# Patient Record
Sex: Female | Born: 1990 | Race: Black or African American | Hispanic: No | Marital: Married | Smoking: Current every day smoker
Health system: Southern US, Community
[De-identification: ages and names within clinical notes are randomized; demographics above are authoritative.]

## PROBLEM LIST (undated history)

## (undated) DIAGNOSIS — I10 Essential (primary) hypertension: Secondary | ICD-10-CM

## (undated) DIAGNOSIS — G8929 Other chronic pain: Secondary | ICD-10-CM

## (undated) DIAGNOSIS — N200 Calculus of kidney: Secondary | ICD-10-CM

## (undated) DIAGNOSIS — J302 Other seasonal allergic rhinitis: Secondary | ICD-10-CM

## (undated) DIAGNOSIS — R109 Unspecified abdominal pain: Secondary | ICD-10-CM

## (undated) DIAGNOSIS — M549 Dorsalgia, unspecified: Secondary | ICD-10-CM

## (undated) DIAGNOSIS — R112 Nausea with vomiting, unspecified: Secondary | ICD-10-CM

## (undated) DIAGNOSIS — E119 Type 2 diabetes mellitus without complications: Secondary | ICD-10-CM

## (undated) DIAGNOSIS — K219 Gastro-esophageal reflux disease without esophagitis: Secondary | ICD-10-CM

## (undated) HISTORY — PX: TONSILLECTOMY AND ADENOIDECTOMY: SHX28

## (undated) HISTORY — PX: TONSILLECTOMY: SUR1361

---
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
## 2009-08-28 ENCOUNTER — Emergency Department (HOSPITAL_COMMUNITY): Admission: EM | Admit: 2009-08-28 | Discharge: 2009-08-29 | Payer: Self-pay | Admitting: Emergency Medicine

## 2009-08-28 ENCOUNTER — Emergency Department (HOSPITAL_COMMUNITY): Admission: EM | Admit: 2009-08-28 | Discharge: 2009-08-28 | Payer: Self-pay | Admitting: Emergency Medicine

## 2009-08-31 ENCOUNTER — Emergency Department (HOSPITAL_COMMUNITY): Admission: EM | Admit: 2009-08-31 | Discharge: 2009-08-31 | Payer: Self-pay | Admitting: Emergency Medicine

## 2009-11-01 ENCOUNTER — Emergency Department (HOSPITAL_COMMUNITY): Admission: EM | Admit: 2009-11-01 | Discharge: 2009-11-01 | Payer: Self-pay | Admitting: Emergency Medicine

## 2009-12-07 ENCOUNTER — Emergency Department (HOSPITAL_COMMUNITY): Admission: EM | Admit: 2009-12-07 | Discharge: 2009-12-07 | Payer: Self-pay | Admitting: Emergency Medicine

## 2010-08-07 ENCOUNTER — Emergency Department (HOSPITAL_COMMUNITY): Admission: EM | Admit: 2010-08-07 | Discharge: 2010-08-07 | Payer: Self-pay | Admitting: Emergency Medicine

## 2011-02-15 LAB — GC/CHLAMYDIA PROBE AMP, GENITAL: Chlamydia, DNA Probe: NEGATIVE

## 2011-02-15 LAB — WET PREP, GENITAL: WBC, Wet Prep HPF POC: NONE SEEN

## 2011-02-15 LAB — POCT URINALYSIS DIPSTICK
Protein, ur: >=300 mg/dL — AB
Specific Gravity, Urine: >=1.03 (ref 1.005–1.030)
Urobilinogen, UA: 1 mg/dL (ref 0.0–1.0)
pH: 6.5 (ref 5.0–8.0)

## 2011-02-15 LAB — POCT PREGNANCY, URINE: Preg Test, Ur: NEGATIVE

## 2011-03-07 LAB — URINALYSIS, ROUTINE W REFLEX MICROSCOPIC
Leukocytes, UA: NEGATIVE
Nitrite: NEGATIVE
Protein, ur: NEGATIVE mg/dL
Urobilinogen, UA: 0.2 mg/dL (ref 0.0–1.0)
pH: 6 (ref 5.0–8.0)

## 2011-03-07 LAB — CBC
HCT: 40.6 % (ref 36.0–46.0)
MCHC: 34.4 g/dL (ref 30.0–36.0)
MCV: 91.3 fL (ref 78.0–100.0)
Platelets: 372 10*3/uL (ref 150–400)

## 2011-03-07 LAB — WET PREP, GENITAL: Yeast Wet Prep HPF POC: NONE SEEN

## 2011-03-07 LAB — BASIC METABOLIC PANEL
BUN: 11 mg/dL (ref 6–23)
CO2: 28 mEq/L (ref 19–32)
Chloride: 103 mEq/L (ref 96–112)
Creatinine, Ser: 1.01 mg/dL (ref 0.4–1.2)
Glucose, Bld: 97 mg/dL (ref 70–99)
Potassium: 3.7 mEq/L (ref 3.5–5.1)

## 2011-03-07 LAB — DIFFERENTIAL
Basophils Relative: 1 % (ref 0–1)
Eosinophils Absolute: 0.3 10*3/uL (ref 0.0–0.7)
Eosinophils Relative: 4 % (ref 0–5)
Neutrophils Relative %: 60 % (ref 43–77)

## 2011-03-07 LAB — PREGNANCY, URINE: Preg Test, Ur: NEGATIVE

## 2011-03-07 LAB — HCG, QUANTITATIVE, PREGNANCY: hCG, Beta Chain, Quant, S: <2 m[IU]/mL (ref <5)

## 2011-03-07 LAB — GC/CHLAMYDIA PROBE AMP, GENITAL: GC Probe Amp, Genital: NEGATIVE

## 2011-03-07 LAB — URINE MICROSCOPIC-ADD ON

## 2011-03-09 LAB — COMPREHENSIVE METABOLIC PANEL
ALT: 21 U/L (ref 0–35)
AST: 25 U/L (ref 0–37)
Albumin: 4.5 g/dL (ref 3.5–5.2)
Alkaline Phosphatase: 66 U/L (ref 39–117)
BUN: 9 mg/dL (ref 6–23)
Chloride: 106 mEq/L (ref 96–112)
Potassium: 3.7 mEq/L (ref 3.5–5.1)
Sodium: 139 mEq/L (ref 135–145)
Total Bilirubin: 0.8 mg/dL (ref 0.3–1.2)
Total Protein: 7.6 g/dL (ref 6.0–8.3)

## 2011-03-09 LAB — URINALYSIS, ROUTINE W REFLEX MICROSCOPIC
Glucose, UA: NEGATIVE mg/dL
Ketones, ur: NEGATIVE mg/dL
Nitrite: NEGATIVE
Protein, ur: NEGATIVE mg/dL
pH: 8 (ref 5.0–8.0)

## 2011-03-09 LAB — CBC
HCT: 38.7 % (ref 36.0–46.0)
Platelets: 319 10*3/uL (ref 150–400)
RDW: 12.7 % (ref 11.5–15.5)
WBC: 5.4 10*3/uL (ref 4.0–10.5)

## 2011-03-09 LAB — WET PREP, GENITAL: Trich, Wet Prep: NONE SEEN

## 2011-03-09 LAB — DIFFERENTIAL
Basophils Absolute: 0.1 10*3/uL (ref 0.0–0.1)
Basophils Relative: 2 % — ABNORMAL HIGH (ref 0–1)
Eosinophils Absolute: 0.4 10*3/uL (ref 0.0–0.7)
Eosinophils Relative: 7 % — ABNORMAL HIGH (ref 0–5)
Monocytes Absolute: 0.4 10*3/uL (ref 0.1–1.0)
Monocytes Relative: 7 % (ref 3–12)

## 2011-03-09 LAB — PREGNANCY, URINE: Preg Test, Ur: NEGATIVE

## 2011-03-09 LAB — GC/CHLAMYDIA PROBE AMP, GENITAL
Chlamydia, DNA Probe: POSITIVE — AB
GC Probe Amp, Genital: NEGATIVE

## 2011-03-09 LAB — RPR: RPR Ser Ql: NONREACTIVE

## 2011-08-16 ENCOUNTER — Emergency Department (HOSPITAL_COMMUNITY)
Admission: EM | Admit: 2011-08-16 | Discharge: 2011-08-16 | Disposition: A | Discharge status: Home / Self Care | Payer: Self-pay | Attending: Emergency Medicine | Admitting: Emergency Medicine

## 2011-08-16 DIAGNOSIS — N390 Urinary tract infection, site not specified: Secondary | ICD-10-CM | POA: Insufficient documentation

## 2011-08-16 DIAGNOSIS — R300 Dysuria: Secondary | ICD-10-CM | POA: Insufficient documentation

## 2011-08-16 DIAGNOSIS — R109 Unspecified abdominal pain: Secondary | ICD-10-CM | POA: Insufficient documentation

## 2011-08-16 LAB — URINE MICROSCOPIC-ADD ON

## 2011-08-16 LAB — URINALYSIS, ROUTINE W REFLEX MICROSCOPIC
Glucose, UA: NEGATIVE mg/dL
Ketones, ur: NEGATIVE mg/dL
Protein, ur: NEGATIVE mg/dL
Urobilinogen, UA: 0.2 mg/dL (ref 0.0–1.0)

## 2011-08-16 LAB — POCT PREGNANCY, URINE: Preg Test, Ur: NEGATIVE

## 2011-09-07 ENCOUNTER — Emergency Department (HOSPITAL_COMMUNITY)
Admission: EM | Admit: 2011-09-07 | Discharge: 2011-09-07 | Disposition: A | Discharge status: Home / Self Care | Payer: Self-pay | Attending: Emergency Medicine | Admitting: Emergency Medicine

## 2011-09-07 ENCOUNTER — Encounter: Payer: Self-pay | Admitting: *Deleted

## 2011-09-07 DIAGNOSIS — R109 Unspecified abdominal pain: Secondary | ICD-10-CM | POA: Insufficient documentation

## 2011-09-07 DIAGNOSIS — R112 Nausea with vomiting, unspecified: Secondary | ICD-10-CM | POA: Insufficient documentation

## 2011-09-07 DIAGNOSIS — F17200 Nicotine dependence, unspecified, uncomplicated: Secondary | ICD-10-CM | POA: Insufficient documentation

## 2011-09-07 DIAGNOSIS — M549 Dorsalgia, unspecified: Secondary | ICD-10-CM | POA: Insufficient documentation

## 2011-09-07 DIAGNOSIS — Z79899 Other long term (current) drug therapy: Secondary | ICD-10-CM | POA: Insufficient documentation

## 2011-09-07 DIAGNOSIS — N12 Tubulo-interstitial nephritis, not specified as acute or chronic: Secondary | ICD-10-CM | POA: Insufficient documentation

## 2011-09-07 DIAGNOSIS — R350 Frequency of micturition: Secondary | ICD-10-CM | POA: Insufficient documentation

## 2011-09-07 DIAGNOSIS — R3915 Urgency of urination: Secondary | ICD-10-CM | POA: Insufficient documentation

## 2011-09-07 LAB — CBC
HCT: 36.8 % (ref 36.0–46.0)
Hemoglobin: 12.7 g/dL (ref 12.0–15.0)
WBC: 7.4 10*3/uL (ref 4.0–10.5)

## 2011-09-07 LAB — URINALYSIS, ROUTINE W REFLEX MICROSCOPIC
Glucose, UA: NEGATIVE mg/dL
Ketones, ur: NEGATIVE mg/dL
Protein, ur: NEGATIVE mg/dL

## 2011-09-07 LAB — DIFFERENTIAL
Lymphocytes Relative: 26 % (ref 12–46)
Lymphs Abs: 1.9 10*3/uL (ref 0.7–4.0)
Monocytes Absolute: 0.6 10*3/uL (ref 0.1–1.0)
Monocytes Relative: 8 % (ref 3–12)
Neutro Abs: 4.5 10*3/uL (ref 1.7–7.7)

## 2011-09-07 LAB — COMPREHENSIVE METABOLIC PANEL
AST: 27 U/L (ref 0–37)
BUN: 13 mg/dL (ref 6–23)
CO2: 25 mEq/L (ref 19–32)
Chloride: 101 mEq/L (ref 96–112)
Creatinine, Ser: 0.66 mg/dL (ref 0.50–1.10)
GFR calc non Af Amer: >90 mL/min (ref >90)
Total Bilirubin: 0.8 mg/dL (ref 0.3–1.2)

## 2011-09-07 LAB — PREGNANCY, URINE: Preg Test, Ur: NEGATIVE

## 2011-09-07 LAB — URINE MICROSCOPIC-ADD ON

## 2011-09-07 MED ORDER — CEPHALEXIN 500 MG PO CAPS: 500.0000 mg | ORAL_CAPSULE | Freq: Four times a day (QID) | ORAL | Status: AC

## 2011-09-07 MED ORDER — ONDANSETRON HCL 4 MG/2ML IJ SOLN
4.0000 mg | Freq: Once | INTRAMUSCULAR | Status: AC
  Administered 2011-09-07: 4 mg via INTRAVENOUS
  Filled 2011-09-07: qty 2

## 2011-09-07 MED ORDER — SODIUM CHLORIDE 0.9 % IV BOLUS (SEPSIS)
1000.0000 mL | Freq: Once | INTRAVENOUS | Status: AC
  Administered 2011-09-07: 1000 mL via INTRAVENOUS

## 2011-09-07 MED ORDER — MORPHINE SULFATE 4 MG/ML IJ SOLN
4.0000 mg | Freq: Once | INTRAMUSCULAR | Status: AC
  Administered 2011-09-07: 4 mg via INTRAVENOUS
  Filled 2011-09-07: qty 1

## 2011-09-07 MED ORDER — HYDROCODONE-ACETAMINOPHEN 5-500 MG PO TABS
1.0000 | ORAL_TABLET | Freq: Four times a day (QID) | ORAL | Status: AC | PRN
Start: 2011-09-07 — End: 2011-09-17

## 2011-09-07 MED ORDER — DEXTROSE 5 % IV SOLN
1.0000 g | Freq: Once | INTRAVENOUS | Status: AC
  Administered 2011-09-07: 1 g via INTRAVENOUS
  Filled 2011-09-07: qty 1

## 2011-09-07 MED ORDER — PROMETHAZINE HCL 25 MG PO TABS: 25.0000 mg | ORAL_TABLET | Freq: Four times a day (QID) | ORAL | Status: DC | PRN

## 2011-09-07 NOTE — ED Provider Notes (Signed)
History     CSN: [id number] Arrival date & time: 09/07/2011  6:51 PM  Chief Complaint  Patient presents with  . Emesis    (Consider location/radiation/quality/duration/timing/severity/associated sxs/prior treatment) HPI Comments: Was seen about 2 weeks ago was diagnosed with urinary tract infection. She did not fill her antibiotics. States she has felt progressively worse during this time and over the past week has had worsening abdominal pain, back pain, nausea, vomiting. She also complained of dizziness the triage which when questioned is described as nausea. She denies dysuria but has frequency and urgency. She denies URI symptoms. She denies vaginal symptoms.  Patient is a 20 y.o. female presenting with vomiting. The history is provided by the patient. No language interpreter was used.  Emesis  This is a new problem. The current episode started more than 1 week ago. The problem occurs 2 to 4 times per day. The problem has been gradually worsening. The emesis has an appearance of stomach contents. There has been no fever. Associated symptoms include abdominal pain. Pertinent negatives include no chills, no cough, no diarrhea, no fever, no headaches and no myalgias.    Past Medical History  Diagnosis Date  . Asthma     Past Surgical History  Procedure Date  . Tonsillectomy     No family history on file.  History  Substance Use Topics  . Smoking status: Current Some Day Smoker    Types: Cigarettes  . Smokeless tobacco: Not on file  . Alcohol Use: No    OB History    Grav Para Term Preterm Abortions TAB SAB Ect Mult Living                  Review of Systems  Constitutional: Negative for fever, chills, activity change and appetite change.  HENT: Negative for congestion, sore throat, rhinorrhea, neck pain and neck stiffness.   Respiratory: Negative for cough and shortness of breath.   Cardiovascular: Negative for chest pain and palpitations.  Gastrointestinal: Positive  for nausea, vomiting and abdominal pain. Negative for diarrhea and constipation.  Genitourinary: Positive for urgency and frequency. Negative for dysuria, flank pain, vaginal bleeding and vaginal discharge.  Musculoskeletal: Positive for back pain. Negative for myalgias.  Neurological: Negative for weakness, light-headedness, numbness and headaches.  All other systems reviewed and are negative.    Allergies  Review of patient's allergies indicates no known allergies.  Home Medications   Current Outpatient Rx  Name Route Sig Dispense Refill  . CEPHALEXIN 500 MG PO CAPS Oral Take 1 capsule (500 mg total) by mouth 4 (four) times daily. 40 capsule 0  . HYDROCODONE-ACETAMINOPHEN 5-500 MG PO TABS Oral Take 1-2 tablets by mouth every 6 (six) hours as needed for pain. 10 tablet 0  . PROMETHAZINE HCL 25 MG PO TABS Oral Take 1 tablet (25 mg total) by mouth every 6 (six) hours as needed for nausea. 30 tablet 0    BP 116/67  Pulse 73  Temp(Src) 98.9 F (37.2 C) (Oral)  Resp 18  Ht 5\' 7"  (1.702 m)  Wt 200 lb (90.719 kg)  BMI 31.32 kg/m2  SpO2 100%  LMP 08/28/2011  Physical Exam  Nursing note and vitals reviewed. Constitutional: She is oriented to person, place, and time. She appears well-developed and well-nourished. No distress.  HENT:  Head: Normocephalic and atraumatic.  Mouth/Throat: Oropharynx is clear and moist. No oropharyngeal exudate.  Eyes: Conjunctivae and EOM are normal. Pupils are equal, round, and reactive to light.  Neck: Normal range  of motion. Neck supple.  Cardiovascular: Normal rate, regular rhythm, normal heart sounds and intact distal pulses.  Exam reveals no gallop and no friction rub.   No murmur heard. Pulmonary/Chest: Effort normal and breath sounds normal. No respiratory distress.  Abdominal: Soft. Bowel sounds are normal. There is tenderness (diffusely). There is no rebound.  Musculoskeletal: Normal range of motion. She exhibits no tenderness.       Lumbar  back: She exhibits tenderness and pain. She exhibits no bony tenderness, no swelling and no deformity.  Neurological: She is alert and oriented to person, place, and time.  Skin: Skin is warm and dry. No rash noted.    ED Course  Procedures (including critical care time)  Labs Reviewed  URINALYSIS, ROUTINE W REFLEX MICROSCOPIC - Abnormal; Notable for the following:    Specific Gravity, Urine >1.030 (*)    Leukocytes, UA SMALL (*)    All other components within normal limits  URINE MICROSCOPIC-ADD ON - Abnormal; Notable for the following:    Squamous Epithelial / LPF MANY (*)    Bacteria, UA MANY (*)    All other components within normal limits  CBC  DIFFERENTIAL  COMPREHENSIVE METABOLIC PANEL  LIPASE, BLOOD  PREGNANCY, URINE  URINE CULTURE   No results found.   1. Pyelonephritis       MDM  The patient's urinalysis is consistent with a urinary tract infection. This in conjunction with back pain and vomiting is consistent with pyelonephritis. The patient was seen 2 weeks ago was diagnosed with urinary tract infection but that not fill her antibiotics. She's been able to tolerate by mouth on numerous department. I feel that she'll be able to tolerate home treatment of pyelonephritis. There is no indication for admission. She's not dehydrated. She has no white count. She received a dose of Rocephin in the emergency department. Her pain was controlled with morphine. She received Zofran. She received a liter of IV fluids. She'll be discharged home with a prescription for Keflex, Vicodin, Phenergan. She is instructed to followup with her primary care physician. She is provided signs and symptoms for which to return the emergency department        Dayton Bailiff, MD 09/07/11 2055

## 2011-09-07 NOTE — Discharge Instructions (Signed)
Pyelonephritis   (Kidney Infection)  In general there are 2 main types of kidney infections:   Infections that come on quickly without any warning (acute pyelonephritis and acute kidney infection).    Infection that persist for long periods of time (chronic pyelonephritis and chronic kidney infection).     CAUSES  Two main causes of kidney infection are:   Bacteria traveling from the bladder to the kidney. This is a problem especially with pregnant women. The urine in the bladder can become filled with bacteria from multiple causes including:    Prostatitis.    Sexual intercourse in females.    Routine bladder infection also called cystitis.    Bacteria traveling from the blood stream to the tissue part of the kidney.   Problems that may increase the risk of getting a kidney infection include:   Diabetes.    Kidney stones or bladder stones.    Cancer.    Catheters placed in the bladder.    Other abnormalities of the kidney or ureter.   SYMPTOMS   Abdominal pain.    Pain in the side or flank area.    Fever.    Chills.    Upset stomach.    Blood in the urine (dark urine).    Frequent urination.    Strong or persistent urge to urinate.    Burning or stinging when urinating.   The last three symptoms may occur first and may represent symptoms of the bladder infection which may occur prior to the kidney infection.  TREATMENT  In general the treatment depends on how severe the infection is.    If it is mild and caught early the doctor may treat you with oral antibiotics and send you home.    If the infection is more severe the bacteria may have gotten into the bloodstream. This will require intravenous antibiotics and a hospital stay. Symptoms may include:    High fever.    Severe flank pain.    Shaking chills.    Even after a hospital stay the doctor may require you to be on oral antibiotics for a period of time.    Other treatments may be required depending upon the cause of the infection.    HOME CARE INSTRUCTIONS   Take all of the antibiotics given to you by the doctor    Set up appointment to have the urine checked to make sure the infection is gone    Drink plenty of fluids    The doctor may also supply medicines for the bladder if you have urgency and frequency of urination.   SEEK IMMEDIATE MEDICAL CARE IF:   You develop a very high fever over 103 F (39.4 C).    You are unable to take the antibiotics or fluids.    If you develop shaking chills.    You experience extreme weakness or fainting.    There is no improvement after 2 days of treatment.   Document Released: 11/19/2005 Document Re-Released: 02/13/2010  [employer] Patient Information 2011 [employer].

## 2011-09-07 NOTE — ED Notes (Signed)
Pt c/o abdominal pain, back pain, nausea and vomiting x 1 week. Pt denies urinary symptoms. Pt also c/o dizziness off and on x 4 days.

## 2011-09-09 LAB — URINE CULTURE: Colony Count: >=100000

## 2011-09-17 LAB — URINE CULTURE

## 2011-09-17 LAB — URINE MICROSCOPIC-ADD ON

## 2011-09-17 LAB — URINALYSIS, ROUTINE W REFLEX MICROSCOPIC
Bilirubin Urine: NEGATIVE
Glucose, UA: NEGATIVE
Hgb urine dipstick: NEGATIVE
Ketones, ur: NEGATIVE
Protein, ur: NEGATIVE

## 2011-09-17 LAB — POCT PREGNANCY, URINE
Operator id: 277751
Preg Test, Ur: NEGATIVE

## 2011-11-21 ENCOUNTER — Emergency Department (HOSPITAL_COMMUNITY)
Admission: EM | Admit: 2011-11-21 | Discharge: 2011-11-21 | Disposition: A | Discharge status: Home / Self Care | Payer: Self-pay | Attending: Emergency Medicine | Admitting: Emergency Medicine

## 2011-11-21 ENCOUNTER — Encounter (HOSPITAL_COMMUNITY): Payer: Self-pay | Admitting: *Deleted

## 2011-11-21 DIAGNOSIS — Z3201 Encounter for pregnancy test, result positive: Secondary | ICD-10-CM | POA: Insufficient documentation

## 2011-11-21 DIAGNOSIS — L03119 Cellulitis of unspecified part of limb: Secondary | ICD-10-CM

## 2011-11-21 DIAGNOSIS — L02419 Cutaneous abscess of limb, unspecified: Secondary | ICD-10-CM | POA: Insufficient documentation

## 2011-11-21 DIAGNOSIS — F17200 Nicotine dependence, unspecified, uncomplicated: Secondary | ICD-10-CM | POA: Insufficient documentation

## 2011-11-21 LAB — URINALYSIS, ROUTINE W REFLEX MICROSCOPIC
Glucose, UA: NEGATIVE mg/dL
Hgb urine dipstick: NEGATIVE
Leukocytes, UA: NEGATIVE
Specific Gravity, Urine: 1.015 (ref 1.005–1.030)

## 2011-11-21 LAB — PREGNANCY, URINE: Preg Test, Ur: POSITIVE

## 2011-11-21 NOTE — ED Notes (Signed)
Pt states LMP was October and she may be pregnant. Pt also states that since getting tattoo, she has had boils come up on her leg. NAD

## 2011-11-21 NOTE — ED Provider Notes (Addendum)
History     CSN: [id number] Arrival date & time: 11/21/2011 11:24 AM   First MD Initiated Contact with Patient 11/21/11 1127      Chief Complaint  Patient presents with  . Possible Pregnancy    (Consider location/radiation/quality/duration/timing/severity/associated sxs/prior treatment) HPI 20 y/o with history of asthma presents for lesions on R calf and concern of no menstrual period since October.  1st lesion developed two weeks ago.  Started as a "hole" on anterior shin that drained bloody purulent fluid for the first day or two and has not since.  Surrounding erythema soon developed that eventually to >4in diameter.  This is now resolving but still present.  Two days ago a second lesion developed lower down on anterior calf with similar but smaller surround erythema.  Drained some bloody fluid yesterday.  Patient had a tattoo done on arm 1 week prior to first lesion.  Also has a lot of spiders and some mice in her home.    In addition, she has not had a menstrual period since about October 20th.  She usually has menstrual periods monthly, and her last one was normal.      Past Medical History  Diagnosis Date  . Asthma     Past Surgical History  Procedure Date  . Tonsillectomy     No family history on file.  History  Substance Use Topics  . Smoking status: Current Some Day Smoker    Types: Cigarettes  . Smokeless tobacco: Not on file  . Alcohol Use: No    OB History    Grav Para Term Preterm Abortions TAB SAB Ect Mult Living                  Review of Systems  Allergies  Review of patient's allergies indicates no known allergies.  Home Medications   Current Outpatient Rx  Name Route Sig Dispense Refill  . ACETAMINOPHEN 500 MG PO TABS Oral Take 500 mg by mouth as needed. For pain     . BISMUTH SUBSALICYLATE 262 MG/15ML PO SUSP Oral Take 15 mLs by mouth as needed. For upset stomach       BP 109/60  Pulse 78  Temp(Src) 98.7 F (37.1 C) (Oral)  Resp  16  Ht 5\' 7"  (1.702 m)  Wt 200 lb (90.719 kg)  BMI 31.32 kg/m2  SpO2 100%  LMP 09/21/2011  Physical Exam  General: alert, well-developed, and cooperative to examination.  Head: normocephalic and atraumatic.  Eyes: vision grossly intact, pupils equal, pupils round, pupils reactive to light, no injection and anicteric.  Mouth: pharynx pink and moist, no erythema, and no exudates.  Lungs: normal respiratory effort, no accessory muscle use, normal breath sounds, no crackles, and no wheezes. Heart: normal rate, regular rhythm, no murmur, no gallop, and no rub.  Abdomen: soft, non-tender, normal bowel sounds, no distention, no guarding, no rebound tenderness Pulses: 2+ DP/PT pulses bilaterally  Extremities: Right lower extremity: Two excoriated spots on R calf.  Superior spot has 0.75cm diameter central excoriation and surrounding 8cm diameter resolving erythema.  Inferior spot is small spot of excoriation with surround redder erythema.  No drainage can be expressed, no fluid pocket can be palpated.  Neurologic: alert & oriented X3, cranial nerves II-XII intact, strength normal in all extremities   ED Course  Procedures (including critical care time)  Labs Reviewed  URINALYSIS, ROUTINE W REFLEX MICROSCOPIC - Abnormal; Notable for the following:    APPearance HAZY (*)    All  other components within normal limits  POCT PREGNANCY, URINE  PREGNANCY, URINE  POCT PREGNANCY, URINE   No results found.   1. Positive pregnancy test   2. Cellulitis, leg        MDM  I discussed this case with my attending Dr. Adriana Simas who also saw this patient.  Cellulitis seems to be improving.  In setting of pregnancy do not wish to start abx at this time.   Pregnancy test positive.  Patient should follow-up with OBGyn on call Dr. Emelda Fear.  His office number given to patient.          Blanca Friend, MD 11/21/11 1306  Blanca Friend, MD 11/21/11 1306

## 2011-11-21 NOTE — ED Notes (Addendum)
Pt presents with ?"boils" to rt lower leg .  Present for 3 weeks,but had a way to the hospital today.  Also thinks she may be pregnant.

## 2011-11-21 NOTE — ED Provider Notes (Signed)
  I performed a history and physical examination of Danielle Zimmerman and discussed her management with Dr. Yaakov Guthrie.  I agree with the history, physical, assessment, and plan of care, with the following exceptions: None  I was present for the following procedures: None Time Spent in Critical Care of the patient: None Time spent in discussions with the patient and family:   Vernona Rieger, MD 11/21/11 1353

## 2011-12-11 ENCOUNTER — Emergency Department (HOSPITAL_COMMUNITY)
Admission: EM | Admit: 2011-12-11 | Discharge: 2011-12-11 | Discharge status: Against Medical Advice | Payer: Self-pay | Attending: Emergency Medicine | Admitting: Emergency Medicine

## 2011-12-11 ENCOUNTER — Encounter (HOSPITAL_COMMUNITY): Payer: Self-pay | Admitting: *Deleted

## 2011-12-11 DIAGNOSIS — R109 Unspecified abdominal pain: Secondary | ICD-10-CM | POA: Insufficient documentation

## 2011-12-11 NOTE — ED Notes (Signed)
Pregnant, abd cramping for 4-5 days. No bleeding.  Nausea, vomiting, no diarrhea/

## 2012-01-11 ENCOUNTER — Emergency Department (HOSPITAL_COMMUNITY)
Admission: EM | Admit: 2012-01-11 | Discharge: 2012-01-11 | Disposition: A | Discharge status: Home / Self Care | Payer: Medicaid Other | Attending: Emergency Medicine | Admitting: Emergency Medicine

## 2012-01-11 ENCOUNTER — Encounter (HOSPITAL_COMMUNITY): Payer: Self-pay

## 2012-01-11 ENCOUNTER — Emergency Department (HOSPITAL_COMMUNITY): Payer: Medicaid Other

## 2012-01-11 DIAGNOSIS — O0289 Other abnormal products of conception: Secondary | ICD-10-CM | POA: Insufficient documentation

## 2012-01-11 DIAGNOSIS — Z87891 Personal history of nicotine dependence: Secondary | ICD-10-CM | POA: Insufficient documentation

## 2012-01-11 DIAGNOSIS — A499 Bacterial infection, unspecified: Secondary | ICD-10-CM | POA: Insufficient documentation

## 2012-01-11 DIAGNOSIS — N760 Acute vaginitis: Secondary | ICD-10-CM | POA: Insufficient documentation

## 2012-01-11 DIAGNOSIS — N949 Unspecified condition associated with female genital organs and menstrual cycle: Secondary | ICD-10-CM | POA: Insufficient documentation

## 2012-01-11 DIAGNOSIS — J45909 Unspecified asthma, uncomplicated: Secondary | ICD-10-CM | POA: Insufficient documentation

## 2012-01-11 DIAGNOSIS — O020 Blighted ovum and nonhydatidiform mole: Secondary | ICD-10-CM

## 2012-01-11 DIAGNOSIS — B9689 Other specified bacterial agents as the cause of diseases classified elsewhere: Secondary | ICD-10-CM | POA: Insufficient documentation

## 2012-01-11 LAB — URINALYSIS, ROUTINE W REFLEX MICROSCOPIC
Ketones, ur: NEGATIVE mg/dL
Leukocytes, UA: NEGATIVE
Nitrite: NEGATIVE
Specific Gravity, Urine: 1.02 (ref 1.005–1.030)
Urobilinogen, UA: 0.2 mg/dL (ref 0.0–1.0)
pH: 6 (ref 5.0–8.0)

## 2012-01-11 LAB — DIFFERENTIAL
Basophils Absolute: 0.1 10*3/uL (ref 0.0–0.1)
Basophils Relative: 1 % (ref 0–1)
Eosinophils Absolute: 0.3 10*3/uL (ref 0.0–0.7)
Monocytes Relative: 6 % (ref 3–12)
Neutrophils Relative %: 48 % (ref 43–77)

## 2012-01-11 LAB — CBC
Hemoglobin: 11.9 g/dL — ABNORMAL LOW (ref 12.0–15.0)
MCH: 31 pg (ref 26.0–34.0)
MCHC: 34.9 g/dL (ref 30.0–36.0)
Platelets: 301 10*3/uL (ref 150–400)
RDW: 13 % (ref 11.5–15.5)

## 2012-01-11 LAB — TYPE AND SCREEN: Antibody Screen: NEGATIVE

## 2012-01-11 LAB — RPR: RPR Ser Ql: NONREACTIVE

## 2012-01-11 LAB — WET PREP, GENITAL: Trich, Wet Prep: NONE SEEN

## 2012-01-11 MED ORDER — METRONIDAZOLE 500 MG PO TABS: 500.0000 mg | ORAL_TABLET | Freq: Two times a day (BID) | ORAL | Status: AC

## 2012-01-11 MED ORDER — HYDROCODONE-ACETAMINOPHEN 5-325 MG PO TABS: 1.0000 | ORAL_TABLET | ORAL | Status: AC | PRN

## 2012-01-11 MED ORDER — OXYCODONE-ACETAMINOPHEN 5-325 MG PO TABS
1.0000 | ORAL_TABLET | Freq: Once | ORAL | Status: AC
  Administered 2012-01-11: 1 via ORAL
  Filled 2012-01-11: qty 1

## 2012-01-11 NOTE — ED Notes (Signed)
rn and edpa attempted to assess fht at bedside. edp to see pt.

## 2012-01-11 NOTE — ED Provider Notes (Signed)
Pt seen with PA Pregnant, abd cramping but denies vag bleeding Will need [place] to confirm IUP BP 131/64  Pulse 78  Temp(Src) 98.5 F (36.9 C) (Oral)  Resp 20  Ht 5\' 7"  (1.702 m)  Wt 224 lb (101.606 kg)  BMI 35.08 kg/m2  SpO2 99%  LMP 09/21/2011   Joya Gaskins, MD 01/11/12 1236
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
## 2012-01-12 LAB — GC/CHLAMYDIA PROBE AMP, GENITAL: Chlamydia, DNA Probe: NEGATIVE

## 2012-01-16 ENCOUNTER — Emergency Department (HOSPITAL_COMMUNITY)
Admission: EM | Admit: 2012-01-16 | Discharge: 2012-01-16 | Disposition: A | Discharge status: Home / Self Care | Payer: Medicaid Other | Attending: Emergency Medicine | Admitting: Emergency Medicine

## 2012-01-16 ENCOUNTER — Encounter (HOSPITAL_COMMUNITY): Payer: Self-pay

## 2012-01-16 DIAGNOSIS — R1909 Other intra-abdominal and pelvic swelling, mass and lump: Secondary | ICD-10-CM | POA: Insufficient documentation

## 2012-01-16 DIAGNOSIS — O034 Incomplete spontaneous abortion without complication: Secondary | ICD-10-CM | POA: Insufficient documentation

## 2012-01-16 DIAGNOSIS — R0989 Other specified symptoms and signs involving the circulatory and respiratory systems: Secondary | ICD-10-CM | POA: Insufficient documentation

## 2012-01-16 DIAGNOSIS — R109 Unspecified abdominal pain: Secondary | ICD-10-CM | POA: Insufficient documentation

## 2012-01-16 DIAGNOSIS — R110 Nausea: Secondary | ICD-10-CM | POA: Insufficient documentation

## 2012-01-16 DIAGNOSIS — J45909 Unspecified asthma, uncomplicated: Secondary | ICD-10-CM | POA: Insufficient documentation

## 2012-01-16 MED ORDER — HYDROMORPHONE HCL PF 1 MG/ML IJ SOLN
1.0000 mg | Freq: Once | INTRAMUSCULAR | Status: AC
  Administered 2012-01-16: 1 mg via INTRAMUSCULAR
  Filled 2012-01-16: qty 1

## 2012-01-16 MED ORDER — ONDANSETRON 8 MG PO TBDP: 8.0000 mg | ORAL_TABLET | Freq: Three times a day (TID) | ORAL | Status: AC | PRN

## 2012-01-16 MED ORDER — ONDANSETRON 4 MG PO TBDP
4.0000 mg | ORAL_TABLET | Freq: Once | ORAL | Status: AC
  Administered 2012-01-16: 4 mg via ORAL
  Filled 2012-01-16: qty 1

## 2012-01-16 MED ORDER — OXYCODONE-ACETAMINOPHEN 5-325 MG PO TABS: 1.0000 | ORAL_TABLET | Freq: Four times a day (QID) | ORAL | Status: AC | PRN

## 2012-01-16 NOTE — ED Notes (Signed)
Patient states she does not need anything at this time. 

## 2012-01-16 NOTE — Discharge Instructions (Signed)
Incomplete Miscarriage Miscarriages in pregnancy are common. A miscarriage is a pregnancy that has ended before the twentieth week. You have had an incomplete miscarriage. Partial parts of the fetus or placenta (afterbirth) remain behind. Sometimes further treatment is needed. The most common reason for further treatment is continued bleeding (hemorrhage). Tissue left behind may also become infected. Treatment usually is curettage. Curettage for an incomplete abortion is a procedure in which the remaining products of pregnancy are removed. This can be done by a simple sucking procedure (suction curettage). It can also be done by a simple scraping (curettage) of the inside of the uterus (womb). This may be done in the hospital or in the caregiver's office. This is only done when your caregiver knows the pregnancy has ended. This is determined by physical examination and a negative pregnancy test. It may also include an ultrasound to confirm a dead fetus. The ultrasound may also prove that products of the pregnancy remain in the uterus. If your cervix remains dilated and you are still passing clots and tissue, your caregiver may wish to watch you for a little while. Your caregiver may want to see if you are going to finish passing all of the remaining parts of the pregnancy. If the bleeding continues, they may proceed with curettage. WHY DO I FEEL THIS WAY Miscarriages can be a very emotional time for prospective mothers. This is not you or your partner's fault. The miscarriage did not occur because of a lack in you or your partner. Nearly all miscarriages occur because the pregnancy has started off wrongly. At least half of miscarried pregnancies have a chromosomal abnormality (almost always not inherited). Others may have developmental problems with the fetus or placentas. Problems may not show up even when the products miscarried are studied under the microscope. You can usually begin trying for another  pregnancy as soon as your caregiver says it's okay. HOME CARE INSTRUCTIONS   Your caregiver may order bed rest (this means only getting up to use the bathroom). Your caregiver may allow you to continue light activity. If curettage was not done at this time, but you require further treatment.   Keep track of the number of pads you use each day. Keep track of how saturated (soaked) they are. Record this information.   Do not use tampons. Do not douche or have sexual intercourse until approved by your caregiver.   It is very important to keep all follow-up appointments for re-evaluation and continuing management.   Women who have an Rh negative blood type (ie, A, B, AB, or O negative) need to receive a drug called Rh(D) immune globulin. This medicine helps protect future fetuses against problems that can occur if an Rh negative mother is carrying a baby who is Rh positive.  SEEK IMMEDIATE MEDICAL CARE IF:   You experience severe cramps in your stomach, back, or abdomen.   You run an unexplained temperature (record these).   You pass large clots or tissue (save any tissue for your caregiver to inspect).   Your bleeding increases or you become light-headed, weak, or have fainting episodes.  MAKE SURE YOU:   Understand these instructions.   Will watch your condition.   Will get help right away if you are not doing well or get worse.  Document Released: 11/19/2005 Document Revised: 08/01/2011 Document Reviewed: 07/09/2008 Exit[employer] Patient Information 2012 [employer]. 

## 2012-01-16 NOTE — ED Provider Notes (Signed)
History     CSN: [id number]  Arrival date & time 01/16/12  1129   First MD Initiated Contact with Patient 01/16/12 1303      Chief Complaint  Patient presents with  . Miscarriage    (Consider location/radiation/quality/duration/timing/severity/associated sxs/prior treatment) The history is provided by the patient.   patient states she took a pill to help with a miscarriage. She states her gynecologist Dr. Despina Hidden gave her some medication to help with that. She states she is given on Monday, but didn't take until 11 PM last night since January. She states she's had abdominal pain cramping and heavy bleeding. She thinks she is passed some products already. She states she was [redacted] weeks pregnant. She states it was not an ectopic pregnancy. She states to [facility] that she was given has not helped the pain. No fevers. No dysuria. No lightheadedness or dizziness.  Past Medical History  Diagnosis Date  . Asthma   . Pregnant state, incidental     Past Surgical History  Procedure Date  . Tonsillectomy     No family history on file.  History  Substance Use Topics  . Smoking status: Former Smoker    Types: Cigarettes  . Smokeless tobacco: Not on file  . Alcohol Use: No    OB History    Grav Para Term Preterm Abortions TAB SAB Ect Mult Living   1               Review of Systems  Constitutional: Negative for activity change and appetite change.  HENT: Negative for neck stiffness.   Eyes: Negative for pain.  Respiratory: Negative for chest tightness and shortness of breath.   Cardiovascular: Negative for chest pain and leg swelling.  Gastrointestinal: Positive for nausea (patient states she feels nauseous with the pain medication) and abdominal pain. Negative for vomiting and diarrhea.  Genitourinary: Positive for vaginal bleeding. Negative for flank pain and vaginal discharge.  Musculoskeletal: Negative for back pain.  Skin: Negative for rash.  Neurological: Negative for weakness,  numbness and headaches.  Psychiatric/Behavioral: Negative for behavioral problems.    Allergies  Review of patient's allergies indicates no known allergies.  Home Medications   Current Outpatient Rx  Name Route Sig Dispense Refill  . HYDROCODONE-ACETAMINOPHEN 5-325 MG PO TABS Oral Take 1 tablet by mouth every 4 (four) hours as needed for pain. 20 tablet 0  . METRONIDAZOLE 500 MG PO TABS Oral Take 1 tablet (500 mg total) by mouth 2 (two) times daily. 14 tablet 0  . ONDANSETRON 8 MG PO TBDP Oral Take 1 tablet (8 mg total) by mouth every 8 (eight) hours as needed for nausea. 20 tablet 0  . OXYCODONE-ACETAMINOPHEN 5-325 MG PO TABS Oral Take 1-2 tablets by mouth every 6 (six) hours as needed for pain. 20 tablet 0    BP 130/86  Pulse 71  Temp(Src) 99.1 F (37.3 C) (Oral)  Resp 18  Ht 5\' 7"  (1.702 m)  Wt 225 lb (102.059 kg)  BMI 35.24 kg/m2  SpO2 99%  LMP 01/15/2012  Breastfeeding? Unknown  Physical Exam  Nursing note and vitals reviewed. Constitutional: She is oriented to person, place, and time. She appears well-developed and well-nourished.  HENT:  Head: Normocephalic and atraumatic.  Eyes: EOM are normal. Pupils are equal, round, and reactive to light.  Neck: Normal range of motion. Neck supple.  Cardiovascular: Normal rate, regular rhythm and normal heart sounds.   No murmur heard. Pulmonary/Chest: Effort normal. No respiratory distress. She has  no wheezes. She has rales.  Abdominal: Soft. Bowel sounds are normal. She exhibits mass. She exhibits no distension. There is no tenderness. There is no rebound and no guarding.       Mild suprapubic mass.  Musculoskeletal: Normal range of motion.  Neurological: She is alert and oriented to person, place, and time. No cranial nerve deficit.  Skin: Skin is warm and dry.  Psychiatric: She has a normal mood and affect. Her speech is normal.   pelvic exam showed some likely products of conception passing through the os. There was   not  severe active bleeding. No pertinent drainage. ED Course  Procedures (including critical care time)  Labs Reviewed - No data to display No results found.   1. Incomplete miscarriage       MDM  Patient appears to be miscarrying. She is given some medicine by her OB/GYN to help with that. She states cramping and pain are more than she expected. sHe states the bleeding has improved. Likely products are passing on pelvic exam. She's given increased pain medicines and nausea medicine. She'll followup with Dr. Despina Hidden as needed        Juliet Rude. Rubin Payor, MD 01/16/12 1340

## 2012-01-16 NOTE — ED Notes (Signed)
Pt reports was here a few days ago and was told was having a miscarriage.  Says was given the medicine to help the miscarriage Monday but pt didn't get to take the medication until 10pm last night.  Says has had severe abd cramping and heavy bleeding.  Pt says thinks has already passed POC.
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
## 2012-01-24 NOTE — ED Provider Notes (Signed)
Medical screening examination/treatment/procedure(s) were conducted as a shared visit with non-physician practitioner(s) and myself.  I personally evaluated the patient during the encounter   Joya Gaskins, MD 01/24/12 [phone]

## 2012-03-31 ENCOUNTER — Encounter (HOSPITAL_COMMUNITY): Payer: Self-pay | Admitting: *Deleted

## 2012-03-31 ENCOUNTER — Emergency Department (HOSPITAL_COMMUNITY)
Admission: EM | Admit: 2012-03-31 | Discharge: 2012-03-31 | Disposition: A | Discharge status: Home / Self Care | Payer: Medicaid Other | Attending: Emergency Medicine | Admitting: Emergency Medicine

## 2012-03-31 DIAGNOSIS — J45909 Unspecified asthma, uncomplicated: Secondary | ICD-10-CM | POA: Insufficient documentation

## 2012-03-31 DIAGNOSIS — Z87891 Personal history of nicotine dependence: Secondary | ICD-10-CM | POA: Insufficient documentation

## 2012-03-31 DIAGNOSIS — R109 Unspecified abdominal pain: Secondary | ICD-10-CM | POA: Insufficient documentation

## 2012-03-31 LAB — URINALYSIS, ROUTINE W REFLEX MICROSCOPIC
Bilirubin Urine: NEGATIVE
Protein, ur: NEGATIVE mg/dL
Urobilinogen, UA: 0.2 mg/dL (ref 0.0–1.0)

## 2012-03-31 LAB — URINE MICROSCOPIC-ADD ON

## 2012-03-31 MED ORDER — ACETAMINOPHEN 325 MG PO TABS
650.0000 mg | ORAL_TABLET | Freq: Once | ORAL | Status: AC
  Administered 2012-03-31: 650 mg via ORAL
  Filled 2012-03-31: qty 2

## 2012-03-31 MED ORDER — HYDROCODONE-ACETAMINOPHEN 5-325 MG PO TABS
1.0000 | ORAL_TABLET | Freq: Once | ORAL | Status: AC
  Administered 2012-03-31: 1 via ORAL
  Filled 2012-03-31: qty 1

## 2012-03-31 MED ORDER — HYDROCODONE-ACETAMINOPHEN 5-325 MG PO TABS: 1.0000 | ORAL_TABLET | ORAL | Status: AC | PRN

## 2012-03-31 NOTE — ED Notes (Signed)
Pt presents with lower abdominal pain x 3 days. States "just ended period, but period was lighter and mostly spotting and continues to spot". Pt denies emesis and diarrhea. Admits to nausea and constipation. Pt reports having a spontaneous abortion in february.  Dr Emelda Fear has followed pt during miscarriage.

## 2012-03-31 NOTE — Discharge Instructions (Signed)
Your urine was clear with no infection. Pregnancy was negative. Use the pain medicine as needed. Followup with Dr. Emelda Fear if you continue to have pain.

## 2012-03-31 NOTE — ED Provider Notes (Signed)
History  This chart was scribed for [employer]. Colon Branch, MD by Toya Smothers. The patient was seen in room APA10/APA10. Patient's care was started at 1641.  CSN: [id number]  Arrival date & time 03/31/12  1641   First MD Initiated Contact with Patient 03/31/12 1718      Chief Complaint  Patient presents with  . Abdominal Pain    The history is provided by the patient. No language interpreter was used.    Danielle Zimmerman is a 21 y.o. female with a h/o asthma who presents to the Emergency Department complaining of gradual onset, gradually worsening, constant, severe lower abdominal pain that radiates to the suprapubic region onset yesterday described as sharp and stabbing with no associated symptoms. She denies any modifying factors. She has not taken any medications to improve symptoms. Pt reports her LNMP ended yesterday and that it is the first period that she has had since her miscarriage in February 2013. She was [redacted] weeks along when she miscarried and states that it was her first pregnancy. She states that she originally attributed the pain to being on her menstrual cycle but became concerned when it continued into today. She reports having a prior episode of similar pain one week before being told she was pregnant. She states that the pain she is experiencing now is more severe than the pain she experienced while pregnant. She denies vaginal discharge, vaginal bleeding, chest pain, HA and dysuria. Pt is a former smoker but denies alcohol use.  Pt states that she was seeing Dr. Marylouise Stacks but has not follow up since miscarriage.   Past Medical History  Diagnosis Date  . Asthma     Past Surgical History  Procedure Date  . Tonsillectomy     History reviewed. No pertinent family history.  History  Substance Use Topics  . Smoking status: Former Smoker    Types: Cigarettes  . Smokeless tobacco: Not on file  . Alcohol Use: No    OB History    Grav Para Term Preterm Abortions TAB SAB Ect  Mult Living   1 0   1           Review of Systems A complete 10 system review of systems was obtained and all systems are negative except as noted in the HPI and PMH.   Allergies  Review of patient's allergies indicates no known allergies.  Home Medications  No current outpatient prescriptions on file.  Triage Vitals: BP 124/80  Pulse 62  Temp(Src) 98.3 F (36.8 C) (Oral)  Resp 20  Ht 5\' 7"  (1.702 m)  Wt 210 lb (95.255 kg)  BMI 32.89 kg/m2  SpO2 100%  LMP 03/25/2012  Breastfeeding? No  Physical Exam  Nursing note and vitals reviewed. Constitutional: She is oriented to person, place, and time. She appears well-developed and well-nourished.  HENT:  Head: Normocephalic and atraumatic.  Eyes: Conjunctivae and EOM are normal.  Neck: Normal range of motion. Neck supple.  Cardiovascular: Normal rate, regular rhythm, normal heart sounds and intact distal pulses.  Exam reveals no gallop and no friction rub.   No murmur heard. Pulmonary/Chest: Effort normal and breath sounds normal. No respiratory distress. She has no wheezes. She has no rales.  Abdominal: Soft. Bowel sounds are normal. There is tenderness. There is no rebound and no guarding.       Mild RLQ tenderness and mild suprapubic discomfort to palpation   Musculoskeletal: Normal range of motion. She exhibits no edema.  Neurological: She is  alert and oriented to person, place, and time. No cranial nerve deficit.  Skin: Skin is warm and dry.  Psychiatric: Her behavior is normal.       Flat affect    ED Course  Procedures (including critical care time)  DIAGNOSTIC STUDIES: Oxygen Saturation is 100% on room air,normal by my interpretation.    COORDINATION OF CARE: 5:35PM-Discussed treatment plan which includes urinalysis and pain medications with pt and pt agreed. 7:57PM-Discussed negative pregnancy test with pt and pt acknowledged negative test. Advised antinflammatory for pain control. Advised patient to return if  she develops fever, emesis, or severe abdominal pain. Pt is comfortable being discharged home.  Results for orders placed during the hospital encounter of 03/31/12  URINALYSIS, ROUTINE W REFLEX MICROSCOPIC      Component Value Range   Color, Urine YELLOW  YELLOW    APPearance CLEAR  CLEAR    Specific Gravity, Urine 1.025  1.005 - 1.030    pH 6.0  5.0 - 8.0    Glucose, UA NEGATIVE  NEGATIVE (mg/dL)   Hgb urine dipstick TRACE (*) NEGATIVE    Bilirubin Urine NEGATIVE  NEGATIVE    Ketones, ur NEGATIVE  NEGATIVE (mg/dL)   Protein, ur NEGATIVE  NEGATIVE (mg/dL)   Urobilinogen, UA 0.2  0.0 - 1.0 (mg/dL)   Nitrite NEGATIVE  NEGATIVE    Leukocytes, UA NEGATIVE  NEGATIVE   PREGNANCY, URINE      Component Value Range   Preg Test, Ur NEGATIVE  NEGATIVE   URINE MICROSCOPIC-ADD ON      Component Value Range   Squamous Epithelial / LPF RARE  RARE    WBC, UA 0-2  <3 (WBC/hpf)   RBC / HPF 0-2  <3 (RBC/hpf)   Bacteria, UA RARE  RARE    No results found.   MDM  Patient who presents with lower abdominal pain this present since yesterday. Urine is negative for infection, urine pregnancy is.Patient was given Tylenol and hydrocodone with relief. Pt feels improved after observation and/or treatment in ED.Pt stable in ED with no significant deterioration in condition.The patient appears reasonably screened and/or stabilized for discharge and I doubt any other medical condition or other [facility] requiring further screening, evaluation, or treatment in the ED at this time prior to discharge.  I personally performed the services described in this documentation, which was scribed in my presence. The recorded information has been reviewed and considered.   MDM Reviewed: nursing note and vitals Interpretation: labs        Nicoletta Dress. Colon Branch, MD 04/02/12 1357

## 2012-03-31 NOTE — ED Notes (Signed)
Low abd pain "like my ovaries hurt"

## 2012-05-13 ENCOUNTER — Encounter (HOSPITAL_COMMUNITY): Payer: Self-pay | Admitting: *Deleted

## 2012-05-13 ENCOUNTER — Emergency Department (HOSPITAL_COMMUNITY)
Admission: EM | Admit: 2012-05-13 | Discharge: 2012-05-14 | Disposition: A | Discharge status: Home / Self Care | Payer: Medicaid Other | Attending: Emergency Medicine | Admitting: Emergency Medicine

## 2012-05-13 DIAGNOSIS — R092 Respiratory arrest: Secondary | ICD-10-CM | POA: Insufficient documentation

## 2012-05-13 DIAGNOSIS — Z87891 Personal history of nicotine dependence: Secondary | ICD-10-CM | POA: Insufficient documentation

## 2012-05-13 DIAGNOSIS — H5710 Ocular pain, unspecified eye: Secondary | ICD-10-CM

## 2012-05-13 MED ORDER — FLUORESCEIN SODIUM 1 MG OP STRP
1.0000 | ORAL_STRIP | Freq: Once | OPHTHALMIC | Status: AC
  Administered 2012-05-14: 1 via OPHTHALMIC
  Filled 2012-05-13: qty 1

## 2012-05-13 MED ORDER — HYDROCODONE-ACETAMINOPHEN 5-325 MG PO TABS
1.0000 | ORAL_TABLET | Freq: Once | ORAL | Status: AC
  Administered 2012-05-13: 1 via ORAL
  Filled 2012-05-13: qty 1

## 2012-05-13 MED ORDER — TETRACAINE HCL 0.5 % OP SOLN
1.0000 [drp] | Freq: Once | OPHTHALMIC | Status: AC
  Administered 2012-05-14: 1 [drp] via OPHTHALMIC
  Filled 2012-05-13: qty 2

## 2012-05-13 NOTE — ED Provider Notes (Signed)
History     CSN: [id number]  Arrival date & time 05/13/12  2140   First MD Initiated Contact with Patient 05/13/12 2321      Chief Complaint  Patient presents with  . Eye Pain    (Consider location/radiation/quality/duration/timing/severity/associated sxs/prior treatment) HPI Comments: Patient was accidentally poked with a finger in the R eye just prior to arrival. She states that her eye 'popped' out for approximately 5 seconds before she pushed it back in. She currently complains of R eye pain. She can move eye. She wears contacts at baseline but states vision is more blurry than usual. No N/V. No treatments PTA. Nothing makes symptoms better or worse. Onset was acute. Course is constant.   Patient is a 21 y.o. female presenting with eye pain. The history is provided by the patient.  Eye Pain This is a new problem. The current episode started today. The problem occurs constantly. The problem has been unchanged. Pertinent negatives include no fever, headaches, myalgias, nausea, rash, sore throat or vomiting. Nothing aggravates the symptoms. She has tried nothing for the symptoms. The treatment provided no relief.    Past Medical History  Diagnosis Date  . Asthma     Past Surgical History  Procedure Date  . Tonsillectomy     History reviewed. No pertinent family history.  History  Substance Use Topics  . Smoking status: Former Smoker    Types: Cigarettes  . Smokeless tobacco: Not on file  . Alcohol Use: No    OB History    Grav Para Term Preterm Abortions TAB SAB Ect Mult Living   1               Review of Systems  Constitutional: Negative for fever.  HENT: Negative for sore throat and rhinorrhea.   Eyes: Positive for photophobia, pain and redness. Negative for discharge and visual disturbance.  Gastrointestinal: Negative for nausea and vomiting.  Genitourinary: Negative for dysuria.  Musculoskeletal: Negative for myalgias.  Skin: Negative for rash.    Neurological: Negative for headaches.    Allergies  Review of patient's allergies indicates no known allergies.  Home Medications  No current outpatient prescriptions on file.  BP 129/66  Pulse 91  Temp(Src) 98.7 F (37.1 C) (Oral)  Resp 20  Ht 5\' 7"  (1.702 m)  Wt 225 lb (102.059 kg)  BMI 35.24 kg/m2  LMP 04/16/2012  Physical Exam  Nursing note and vitals reviewed. Constitutional: She appears well-developed and well-nourished.  HENT:  Head: Normocephalic and atraumatic.  Eyes: EOM and lids are normal. Pupils are equal, round, and reactive to light. Right eye exhibits no discharge and no exudate. No foreign body present in the right eye. Left eye exhibits no discharge and no exudate. No foreign body present in the left eye. Right conjunctiva is injected (mild). Right conjunctiva has no hemorrhage. Left conjunctiva is not injected. Left conjunctiva has no hemorrhage. Right eye exhibits normal extraocular motion and no nystagmus. Left eye exhibits normal extraocular motion and no nystagmus.  Neck: Normal range of motion. Neck supple.  Cardiovascular: Normal rate, regular rhythm and normal heart sounds.   Pulmonary/Chest: Effort normal and breath sounds normal.  Abdominal: Soft. There is no tenderness.  Neurological: She is alert.  Skin: Skin is warm and dry.  Psychiatric: She has a normal mood and affect.    ED Course  Procedures (including critical care time)  Labs Reviewed - No data to display No results found.   1. Eye pain  11:28 PM Patient seen and examined.    Vital signs reviewed and are as follows: Filed Vitals:   05/13/12 2155  BP: 129/66  Pulse: 91  Temp: 98.7 F (37.1 C)  Resp: 20   Two drops of tetracaine/proparacaine instilled into affected eye.   Fluorescein strip applied to affected eye. Wood's lamp used to assess for corneal abrasion. No corneal abrasion identified. No foreign bodies noted. No visible hyphema. Neg Seidel sign.  Patient  tolerated procedure well without immediate complication.   Patient was discussed with Dr. Colon Branch. Patient was given opthalmology follow-up.   Note: When patient was discharged, EMR was down. Discharge instructions were written and referral instructions written.   Hand-written rx for hydrocodone.   Patient counseled on use of narcotic pain medications. Counseled not to combine these medications with others containing tylenol. Urged not to drink alcohol, drive, or perform any other activities that requires focus while taking these medications. The patient verbalizes understanding and agrees with the plan.   MDM  No evidence of corneal abrasion, neg Seidel sign, do not suspect globe rupture. EOM fully intact. Opthalmology follow-up indicated, referral given.         [place], [place] 05/15/12 [phone]

## 2012-05-13 NOTE — ED Notes (Signed)
Pt says she was "poked in the eye" and says her "eye came out of the socket"  Says she had to "push it back in".  Eye is red, hurt for eye to move. Says vision is blurry  In RT eye

## 2012-05-18 NOTE — ED Provider Notes (Signed)
Medical screening examination/treatment/procedure(s) were performed by non-physician practitioner and as supervising physician I was immediately available for consultation/collaboration.  Kirtan Sada S. Tameya Kuznia, MD 05/18/12 0127 

## 2012-05-28 ENCOUNTER — Emergency Department (HOSPITAL_COMMUNITY): Payer: Medicaid Other

## 2012-05-28 ENCOUNTER — Emergency Department (HOSPITAL_COMMUNITY)
Admission: EM | Admit: 2012-05-28 | Discharge: 2012-05-28 | Disposition: A | Discharge status: Home / Self Care | Payer: Medicaid Other | Attending: Emergency Medicine | Admitting: Emergency Medicine

## 2012-05-28 ENCOUNTER — Encounter (HOSPITAL_COMMUNITY): Payer: Self-pay | Admitting: *Deleted

## 2012-05-28 DIAGNOSIS — J45909 Unspecified asthma, uncomplicated: Secondary | ICD-10-CM | POA: Insufficient documentation

## 2012-05-28 DIAGNOSIS — Z87891 Personal history of nicotine dependence: Secondary | ICD-10-CM | POA: Insufficient documentation

## 2012-05-28 DIAGNOSIS — R05 Cough: Secondary | ICD-10-CM | POA: Insufficient documentation

## 2012-05-28 DIAGNOSIS — J40 Bronchitis, not specified as acute or chronic: Secondary | ICD-10-CM

## 2012-05-28 DIAGNOSIS — R51 Headache: Secondary | ICD-10-CM | POA: Insufficient documentation

## 2012-05-28 DIAGNOSIS — R059 Cough, unspecified: Secondary | ICD-10-CM | POA: Insufficient documentation

## 2012-05-28 DIAGNOSIS — R042 Hemoptysis: Secondary | ICD-10-CM | POA: Insufficient documentation

## 2012-05-28 DIAGNOSIS — R0602 Shortness of breath: Secondary | ICD-10-CM | POA: Insufficient documentation

## 2012-05-28 LAB — POCT PREGNANCY, URINE: Preg Test, Ur: NEGATIVE

## 2012-05-28 MED ORDER — ACETAMINOPHEN 325 MG PO TABS
650.0000 mg | ORAL_TABLET | Freq: Once | ORAL | Status: AC
  Administered 2012-05-28: 650 mg via ORAL
  Filled 2012-05-28: qty 2

## 2012-05-28 MED ORDER — AZITHROMYCIN 250 MG PO TABS: 250.0000 mg | ORAL_TABLET | Freq: Every day | ORAL | Status: AC

## 2012-05-28 MED ORDER — PREDNISONE 50 MG PO TABS: 50.0000 mg | ORAL_TABLET | Freq: Every day | ORAL | Status: AC

## 2012-05-28 MED ORDER — IBUPROFEN 800 MG PO TABS
800.0000 mg | ORAL_TABLET | Freq: Once | ORAL | Status: AC
  Administered 2012-05-28: 800 mg via ORAL
  Filled 2012-05-28: qty 1
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
## 2012-05-28 NOTE — ED Notes (Signed)
Patient with no complaints at this time. Respirations even and unlabored. Skin warm/dry. Discharge instructions reviewed with patient at this time. Patient given opportunity to voice concerns/ask questions. Patient discharged at this time and left Emergency Department with steady gait.   

## 2012-05-28 NOTE — Discharge Instructions (Signed)
Bronchitis Bronchitis is a problem of the air tubes leading to your lungs. This problem makes it hard for air to get in and out of the lungs. You may cough a lot because your air tubes are narrow. Going without care can cause lasting (chronic) bronchitis. HOME CARE   Drink enough fluids to keep your pee (urine) clear or pale yellow.   Use a cool mist humidifier.   Quit smoking if you smoke. If you keep smoking, the bronchitis might not get better.   Only take medicine as told by your doctor.  GET HELP RIGHT AWAY IF:   Coughing keeps you awake.   You start to wheeze.   You become more sick or weak.   You have a hard time breathing or get short of breath.   You cough up blood.   Coughing lasts more than 2 weeks.   You have a fever.   Your baby is older than 3 months with a rectal temperature of 102 F (38.9 C) or higher.   Your baby is 60 months old or younger with a rectal temperature of 100.4 F (38 C) or higher.  MAKE SURE YOU:  Understand these instructions.   Will watch your condition.   Will get help right away if you are not doing well or get worse.  Document Released: 05/07/2008 Document Revised: 11/08/2011 Document Reviewed: 10/21/2009 [facility] Patient Information 2012 [place], [place].  Chest x-ray shows no pneumonia. Increase fluids. Stop smoking. Antibiotic for 5 days and prednisone for one week.

## 2012-05-28 NOTE — ED Notes (Addendum)
Cough for 2 days, Hemoptysis , nausea.  ?fever.  Headache, Thinks she may be pregnant.

## 2012-07-22 ENCOUNTER — Encounter (HOSPITAL_COMMUNITY): Payer: Self-pay

## 2012-07-22 ENCOUNTER — Emergency Department (HOSPITAL_COMMUNITY)
Admission: EM | Admit: 2012-07-22 | Discharge: 2012-07-22 | Disposition: A | Discharge status: Home / Self Care | Payer: Medicaid Other | Attending: Emergency Medicine | Admitting: Emergency Medicine

## 2012-07-22 DIAGNOSIS — Z87891 Personal history of nicotine dependence: Secondary | ICD-10-CM | POA: Insufficient documentation

## 2012-07-22 DIAGNOSIS — M545 Low back pain: Secondary | ICD-10-CM

## 2012-07-22 DIAGNOSIS — T148XXA Other injury of unspecified body region, initial encounter: Secondary | ICD-10-CM

## 2012-07-22 DIAGNOSIS — M549 Dorsalgia, unspecified: Secondary | ICD-10-CM | POA: Insufficient documentation

## 2012-07-22 MED ORDER — METHOCARBAMOL 500 MG PO TABS: 1000.0000 mg | ORAL_TABLET | Freq: Four times a day (QID) | ORAL | Status: DC | PRN

## 2012-07-22 MED ORDER — NAPROXEN 250 MG PO TABS: 250.0000 mg | ORAL_TABLET | Freq: Two times a day (BID) | ORAL | Status: DC

## 2012-07-22 MED ORDER — TRAMADOL HCL 50 MG PO TABS: 50.0000 mg | ORAL_TABLET | Freq: Four times a day (QID) | ORAL | Status: DC | PRN

## 2012-07-22 NOTE — ED Notes (Signed)
Pt c/o lower back pain x 2 or 3 days.  Denies injury.  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  Denies pain radiating down legs or any pain or burning with urination.

## 2012-07-22 NOTE — ED Provider Notes (Signed)
History     CSN: [id number]  Arrival date & time 07/22/12  1743   First MD Initiated Contact with Patient 07/22/12 1755      Chief Complaint  Patient presents with  . Back Pain    HPI Pt was seen at 1805.  Per pt, c/o gradual onset and persistence of constant left sided low back "pain" for the past 2-3 days.  States has been picking up boxes at work before the pain began.  Pain worsens with palpation of the area and body position changes. Denies incont/retention of bowel or bladder, no saddle anesthesia, no focal motor weakness, no tingling/numbness in extremities, no fevers, no injury, no abd pain.   The symptoms have been associated with no other complaints.     Past Medical History  Diagnosis Date  . Asthma     Past Surgical History  Procedure Date  . Tonsillectomy      History  Substance Use Topics  . Smoking status: Former Smoker    Types: Cigarettes  . Smokeless tobacco: Not on file  . Alcohol Use: No      Review of Systems ROS: Statement: All systems negative except as marked or noted in the HPI; Constitutional: Negative for fever and chills. ; ; Eyes: Negative for eye pain, redness and discharge. ; ; ENMT: Negative for ear pain, hoarseness, nasal congestion, sinus pressure and sore throat. ; ; Cardiovascular: Negative for chest pain, palpitations, diaphoresis, dyspnea and peripheral edema. ; ; Respiratory: Negative for cough, wheezing and stridor. ; ; Gastrointestinal: Negative for nausea, vomiting, diarrhea, abdominal pain, blood in stool, hematemesis, jaundice and rectal bleeding. . ; ; Genitourinary: Negative for dysuria, flank pain and hematuria. ; ; Musculoskeletal: +LBP. Negative for neck pain. Negative for swelling and trauma.; ; Skin: Negative for pruritus, rash, abrasions, blisters, bruising and skin lesion.; ; Neuro: Negative for headache, lightheadedness and neck stiffness. Negative for weakness, altered level of consciousness , altered mental status,  extremity weakness, paresthesias, involuntary movement, seizure and syncope.       Allergies  Review of patient's allergies indicates no known allergies.  Home Medications   Current Outpatient Rx  Name Route Sig Dispense Refill  . METHOCARBAMOL 500 MG PO TABS Oral Take 2 tablets (1,000 mg total) by mouth 4 (four) times daily as needed (muscle spasm/pain). 25 tablet 0  . NAPROXEN 250 MG PO TABS Oral Take 1 tablet (250 mg total) by mouth 2 (two) times daily with a meal. 14 tablet 0  . TRAMADOL HCL 50 MG PO TABS Oral Take 1 tablet (50 mg total) by mouth every 6 (six) hours as needed for pain. 15 tablet 0    BP 125/70  Pulse 91  Temp 98.7 F (37.1 C) (Oral)  Resp 20  Ht 5\' 7"  (1.702 m)  Wt 220 lb (99.791 kg)  BMI 34.46 kg/m2  SpO2 100%  LMP 07/03/2012  Physical Exam 1810: Physical examination:  Nursing notes reviewed; Vital signs and O2 SAT reviewed;  Constitutional: Well developed, Well nourished, Well hydrated, In no acute distress; Head:  Normocephalic, atraumatic; Eyes: EOMI, PERRL, No scleral icterus; ENMT: Mouth and pharynx normal, Mucous membranes moist; Neck: Supple, Full range of motion, No lymphadenopathy; Cardiovascular: Regular rate and rhythm, No murmur, rub, or gallop; Respiratory: Breath sounds clear & equal bilaterally, No rales, rhonchi, wheezes.  Speaking full sentences with ease, Normal respiratory effort/excursion; Chest: Nontender, Movement normal; Abdomen: Soft, Nontender, Nondistended, Normal bowel sounds; Genitourinary: No CVA tenderness;  Spine:  No midline  CS, TS, LS tenderness.  +TTP left lumbar paraspinal muscles.;; Extremities: Pulses normal, No tenderness, No edema, No calf edema or asymmetry.; Neuro: AA&Ox3, Major CN grossly intact.  Speech clear. Strength 5/5 equal bilat UE's and LE's, including great toe dorsiflexion.  DTR 2/4 equal bilat UE's and LE's.  No gross sensory deficits.  Neg straight leg raises bilat.  Gait steady;;; Skin: Color normal, Warm,  Dry.   ED Course  Procedures     MDM  MDM Reviewed: nursing note, vitals and previous chart Interpretation: labs   Results for orders placed during the hospital encounter of 05/28/12  POCT PREGNANCY, URINE      Component Value Range   Preg Test, Ur NEGATIVE  NEGATIVE     1845:  Appears msk pain at this time, will tx symptomatically.  Pt asked me to "give me something different than the usual hydrocodone they give me" because "it makes my stomach upset."  Dx testing d/w pt.  Questions answered.  Verb understanding, agreeable to d/c home with outpt f/u.       Laray Anger, DO 07/24/12 1746

## 2012-08-01 ENCOUNTER — Encounter (HOSPITAL_COMMUNITY): Payer: Self-pay | Admitting: Emergency Medicine

## 2012-08-01 ENCOUNTER — Encounter (HOSPITAL_COMMUNITY): Payer: Self-pay | Admitting: Family Medicine

## 2012-08-01 ENCOUNTER — Emergency Department (HOSPITAL_COMMUNITY)
Admission: EM | Admit: 2012-08-01 | Discharge: 2012-08-01 | Disposition: A | Discharge status: Home / Self Care | Payer: Medicaid Other | Attending: Emergency Medicine | Admitting: Emergency Medicine

## 2012-08-01 ENCOUNTER — Emergency Department (HOSPITAL_COMMUNITY)
Admission: EM | Admit: 2012-08-01 | Discharge: 2012-08-01 | Disposition: A | Discharge status: Home / Self Care | Payer: Medicaid Other | Source: Home / Self Care | Attending: Emergency Medicine | Admitting: Emergency Medicine

## 2012-08-01 DIAGNOSIS — M549 Dorsalgia, unspecified: Secondary | ICD-10-CM

## 2012-08-01 DIAGNOSIS — R112 Nausea with vomiting, unspecified: Secondary | ICD-10-CM | POA: Insufficient documentation

## 2012-08-01 DIAGNOSIS — M5380 Other specified dorsopathies, site unspecified: Secondary | ICD-10-CM | POA: Insufficient documentation

## 2012-08-01 LAB — CBC WITH DIFFERENTIAL/PLATELET
Basophils Absolute: 0.1 10*3/uL (ref 0.0–0.1)
Lymphocytes Relative: 43 % (ref 12–46)
Lymphs Abs: 3.3 10*3/uL (ref 0.7–4.0)
Neutro Abs: 3.4 10*3/uL (ref 1.7–7.7)
Neutrophils Relative %: 45 % (ref 43–77)
Platelets: 355 10*3/uL (ref 150–400)
RBC: 4.39 MIL/uL (ref 3.87–5.11)
RDW: 12.8 % (ref 11.5–15.5)
WBC: 7.6 10*3/uL (ref 4.0–10.5)

## 2012-08-01 LAB — BASIC METABOLIC PANEL
CO2: 23 mEq/L (ref 19–32)
Calcium: 9.5 mg/dL (ref 8.4–10.5)
Chloride: 103 mEq/L (ref 96–112)
Potassium: 4 mEq/L (ref 3.5–5.1)
Sodium: 136 mEq/L (ref 135–145)

## 2012-08-01 LAB — URINALYSIS, ROUTINE W REFLEX MICROSCOPIC
Bilirubin Urine: NEGATIVE
Glucose, UA: NEGATIVE mg/dL
Hgb urine dipstick: NEGATIVE
Ketones, ur: NEGATIVE mg/dL
Leukocytes, UA: NEGATIVE
Nitrite: NEGATIVE
Protein, ur: NEGATIVE mg/dL
Specific Gravity, Urine: 1.024 (ref 1.005–1.030)
Specific Gravity, Urine: 1.027 (ref 1.005–1.030)
Urobilinogen, UA: 1 mg/dL (ref 0.0–1.0)
Urobilinogen, UA: 1 mg/dL (ref 0.0–1.0)

## 2012-08-01 LAB — PREGNANCY, URINE: Preg Test, Ur: NEGATIVE

## 2012-08-01 LAB — URINE MICROSCOPIC-ADD ON

## 2012-08-01 MED ORDER — OXYCODONE-ACETAMINOPHEN 5-325 MG PO TABS
1.0000 | ORAL_TABLET | Freq: Once | ORAL | Status: AC
  Administered 2012-08-01: 1 via ORAL
  Filled 2012-08-01: qty 1

## 2012-08-01 MED ORDER — METHOCARBAMOL 750 MG PO TABS: 750.0000 mg | ORAL_TABLET | Freq: Four times a day (QID) | ORAL | Status: AC

## 2012-08-01 MED ORDER — IBUPROFEN 800 MG PO TABS
800.0000 mg | ORAL_TABLET | Freq: Once | ORAL | Status: AC
Start: 2012-08-01 — End: 2012-08-01
  Administered 2012-08-01: 800 mg via ORAL
  Filled 2012-08-01: qty 1

## 2012-08-01 MED ORDER — LORAZEPAM 2 MG/ML IJ SOLN: 2.0000 mg | Freq: Once | INTRAMUSCULAR | Status: DC

## 2012-08-01 MED ORDER — ONDANSETRON 8 MG PO TBDP
8.0000 mg | ORAL_TABLET | Freq: Once | ORAL | Status: AC
  Administered 2012-08-01: 8 mg via ORAL
  Filled 2012-08-01: qty 1

## 2012-08-01 MED ORDER — IBUPROFEN 600 MG PO TABS: 600.0000 mg | ORAL_TABLET | Freq: Four times a day (QID) | ORAL | Status: AC | PRN

## 2012-08-01 MED ORDER — DIAZEPAM 5 MG PO TABS
5.0000 mg | ORAL_TABLET | Freq: Once | ORAL | Status: AC
  Administered 2012-08-01: 5 mg via ORAL
  Filled 2012-08-01: qty 1

## 2012-08-01 NOTE — ED Notes (Signed)
Witnessed pt eating burger and french fries brought from family member. Pt has not c/o N/V since eating.

## 2012-08-01 NOTE — ED Notes (Signed)
Per pt sts she has had some lower back pain, N,D for a few days. sts could be pregnant

## 2012-08-01 NOTE — ED Provider Notes (Signed)
History     CSN: [id number]  Arrival date & time 08/01/12  1531   First MD Initiated Contact with Patient 08/01/12 1859      Chief Complaint  Patient presents with  . Back Pain  . Emesis  . Nausea    (Consider location/radiation/quality/duration/timing/severity/associated sxs/prior treatment) Patient is a 21 y.o. female presenting with back pain and vomiting. The history is provided by the patient.  Back Pain   Emesis    patient here with back pain x5 days that is worse with standing. Works in [employer] and states that when she is not working her back pain is minimal. Denies any radicular symptoms down her legs. No urinary retention or bladder incontinence or perineal numbness. Pain is sharp and worse positions better with rest. No medications taken prior to arrival for this. It is also some concern that she could be pregnant but she denies any vaginal bleeding or discharge abdominal pain  Past Medical History  Diagnosis Date  . Asthma     Past Surgical History  Procedure Date  . Tonsillectomy     No family history on file.  History  Substance Use Topics  . Smoking status: Former Smoker    Types: Cigarettes  . Smokeless tobacco: Not on file  . Alcohol Use: No    OB History    Grav Para Term Preterm Abortions TAB SAB Ect Mult Living   1               Review of Systems  Gastrointestinal: Positive for vomiting.  Musculoskeletal: Positive for back pain.  All other systems reviewed and are negative.    Allergies  Review of patient's allergies indicates no known allergies.  Home Medications  No current outpatient prescriptions on file.  BP 132/76  Pulse 70  Temp 98.8 F (37.1 C) (Oral)  Resp 20  SpO2 100%  LMP 07/03/2012  Physical Exam  Nursing note and vitals reviewed. Constitutional: She is oriented to person, place, and time. She appears well-developed and well-nourished.  Non-toxic appearance. No distress.  HENT:  Head: Normocephalic  and atraumatic.  Eyes: Conjunctivae, EOM and lids are normal. Pupils are equal, round, and reactive to light.  Neck: Normal range of motion. Neck supple. No tracheal deviation present. No mass present.  Cardiovascular: Normal rate, regular rhythm and normal heart sounds.  Exam reveals no gallop.   No murmur heard. Pulmonary/Chest: Effort normal and breath sounds normal. No stridor. No respiratory distress. She has no decreased breath sounds. She has no wheezes. She has no rhonchi. She has no rales.  Abdominal: Soft. Normal appearance and bowel sounds are normal. She exhibits no distension. There is no tenderness. There is no rebound and no CVA tenderness.  Musculoskeletal: Normal range of motion. She exhibits no edema and no tenderness.       Lumbar back: She exhibits pain and spasm. She exhibits no bony tenderness.  Neurological: She is alert and oriented to person, place, and time. She has normal strength. No cranial nerve deficit or sensory deficit. GCS eye subscore is 4. GCS verbal subscore is 5. GCS motor subscore is 6.  Skin: Skin is warm and dry. No abrasion and no rash noted.  Psychiatric: She has a normal mood and affect. Her speech is normal and behavior is normal.    ED Course  Procedures (including critical care time)  Labs Reviewed  CBC WITH DIFFERENTIAL - Abnormal; Notable for the following:    Eosinophils Relative 6 (*)  All other components within normal limits  URINALYSIS, ROUTINE W REFLEX MICROSCOPIC  BASIC METABOLIC PANEL   No results found.   No diagnosis found.    MDM  Patient with likely musculoskeletal back pain. Given pain medications here and she feels better. She is stable for discharge        Toy Baker, MD 08/01/12 2114

## 2012-08-01 NOTE — ED Notes (Signed)
Pt c/o lower back pain, nausea, and vomiting x 5 days.

## 2012-08-31 ENCOUNTER — Emergency Department (HOSPITAL_COMMUNITY)
Admission: EM | Admit: 2012-08-31 | Discharge: 2012-08-31 | Discharge status: Against Medical Advice | Payer: Medicaid Other

## 2012-09-14 ENCOUNTER — Emergency Department (HOSPITAL_COMMUNITY)
Admission: EM | Admit: 2012-09-14 | Discharge: 2012-09-14 | Disposition: A | Discharge status: Home / Self Care | Payer: Self-pay | Attending: Emergency Medicine | Admitting: Emergency Medicine

## 2012-09-14 ENCOUNTER — Emergency Department (HOSPITAL_COMMUNITY): Payer: Self-pay

## 2012-09-14 ENCOUNTER — Encounter (HOSPITAL_COMMUNITY): Payer: Self-pay | Admitting: Emergency Medicine

## 2012-09-14 DIAGNOSIS — J45909 Unspecified asthma, uncomplicated: Secondary | ICD-10-CM | POA: Insufficient documentation

## 2012-09-14 DIAGNOSIS — H81399 Other peripheral vertigo, unspecified ear: Secondary | ICD-10-CM | POA: Insufficient documentation

## 2012-09-14 HISTORY — DX: Other seasonal allergic rhinitis: J30.2

## 2012-09-14 LAB — URINE MICROSCOPIC-ADD ON

## 2012-09-14 LAB — PREGNANCY, URINE: Preg Test, Ur: NEGATIVE

## 2012-09-14 LAB — URINALYSIS, ROUTINE W REFLEX MICROSCOPIC
Ketones, ur: NEGATIVE mg/dL
Leukocytes, UA: NEGATIVE
Nitrite: NEGATIVE
Specific Gravity, Urine: 1.025 (ref 1.005–1.030)
pH: 6 (ref 5.0–8.0)

## 2012-09-14 MED ORDER — MECLIZINE HCL 25 MG PO TABS: 25.0000 mg | ORAL_TABLET | Freq: Three times a day (TID) | ORAL | Status: DC | PRN

## 2012-09-14 MED ORDER — ONDANSETRON HCL 4 MG/2ML IJ SOLN
4.0000 mg | INTRAMUSCULAR | Status: DC | PRN
  Administered 2012-09-14: 4 mg via INTRAVENOUS
  Filled 2012-09-14: qty 2

## 2012-09-14 MED ORDER — MECLIZINE HCL 12.5 MG PO TABS
50.0000 mg | ORAL_TABLET | Freq: Once | ORAL | Status: AC
  Administered 2012-09-14: 50 mg via ORAL
  Filled 2012-09-14: qty 4

## 2012-09-14 MED ORDER — SODIUM CHLORIDE 0.9 % IV SOLN
INTRAVENOUS | Status: DC
  Administered 2012-09-14: 11:00:00 via INTRAVENOUS

## 2012-09-14 MED ORDER — SODIUM CHLORIDE 0.9 % IV BOLUS (SEPSIS)
500.0000 mL | Freq: Once | INTRAVENOUS | Status: AC
  Administered 2012-09-14: 500 mL via INTRAVENOUS

## 2012-09-14 MED ORDER — ONDANSETRON HCL 4 MG PO TABS: 4.0000 mg | ORAL_TABLET | Freq: Three times a day (TID) | ORAL | Status: DC | PRN

## 2012-09-14 NOTE — ED Notes (Signed)
Patient with no complaints at this time. Respirations even and unlabored. Skin warm/dry. Discharge instructions reviewed with patient at this time. Patient given opportunity to voice concerns/ask questions. IV removed per policy and band-aid applied to site. Patient discharged at this time and left Emergency Department with steady gait.  

## 2012-09-14 NOTE — ED Notes (Signed)
Pt c/o body aches since yesterday with dizziness/n/v this am.

## 2012-09-14 NOTE — ED Provider Notes (Signed)
History     CSN: [id number]  Arrival date & time 09/14/12  4098   First MD Initiated Contact with Patient 09/14/12 [id number]      Chief Complaint  Patient presents with  . Dizziness  . Emesis  . Generalized Body Aches     HPI Pt was seen at 0950.  Per pt, c/o sudden onset and persistence of multiple intermittent episodes of "dizziness" since yesterday.  Describes her symptoms as "everything is spinning."  Has been associated with N/V.  Pt also c/o recent sinus congestion and sneezing.  Denies fevers, no sore throat, no abd pain, no diarrhea, no CP/SOB, no cough, no black or blood in stools or emesis, no headache, no visual changes, no focal motor weakness, no tingling/numbness in extremities.     Past Medical History  Diagnosis Date  . Asthma   . Seasonal allergies     Past Surgical History  Procedure Date  . Tonsillectomy     History  Substance Use Topics  . Smoking status: Former Smoker    Types: Cigarettes  . Smokeless tobacco: Not on file  . Alcohol Use: No      Review of Systems ROS: Statement: All systems negative except as marked or noted in the HPI; Constitutional: Negative for fever and chills. ; ; Eyes: Negative for eye pain, redness and discharge. ; ; ENMT: +sinus congestion, sneezing. Negative for ear pain, hoarseness, nasal congestion, sore throat. ; ; Cardiovascular: Negative for chest pain, palpitations, diaphoresis, dyspnea and peripheral edema. ; ; Respiratory: Negative for cough, wheezing and stridor. ; ; Gastrointestinal: +N/V. Negative for diarrhea, abdominal pain, blood in stool, hematemesis, jaundice and rectal bleeding. . ; ; Genitourinary: Negative for dysuria, flank pain and hematuria. ; ; Musculoskeletal: Negative for back pain and neck pain. Negative for swelling and trauma.; ; Skin: Negative for pruritus, rash, abrasions, blisters, bruising and skin lesion.; ; Neuro: +vertigo. Negative for headache, lightheadedness and neck stiffness. Negative for  weakness, altered level of consciousness , altered mental status, extremity weakness, paresthesias, involuntary movement, seizure and syncope.       Allergies  Review of patient's allergies indicates no known allergies.  Home Medications   Current Outpatient Rx  Name Route Sig Dispense Refill  . IBUPROFEN 200 MG PO TABS Oral Take 400 mg by mouth every 6 (six) hours as needed. For pain      BP 132/76  Pulse 66  Temp 98.1 F (36.7 C)  Resp 20  Ht 5\' 7"  (1.702 m)  Wt 236 lb (107.049 kg)  BMI 36.96 kg/m2  SpO2 100%  LMP 09/11/2012  Physical Exam 0955: Physical examination:  Nursing notes reviewed; Vital signs and O2 SAT reviewed;  Constitutional: Well developed, Well nourished, Well hydrated, In no acute distress; Head:  Normocephalic, atraumatic; Eyes: EOMI, PERRL, No scleral icterus; ENMT: TM's clear bilat.  +edemetous nasal turbinates bilat with clear rhinorrhea.  Mouth and pharynx normal, Mucous membranes moist; Neck: Supple, Full range of motion, No lymphadenopathy; Cardiovascular: Regular rate and rhythm, No murmur, rub, or gallop; Respiratory: Breath sounds clear & equal bilaterally, No rales, rhonchi, wheezes.  Speaking full sentences with ease, Normal respiratory effort/excursion; Chest: Nontender, Movement normal; Abdomen: Soft, Nontender, Nondistended, Normal bowel sounds; Genitourinary: No CVA tenderness; Extremities: Pulses normal, No tenderness, No edema, No calf edema or asymmetry.; Neuro: AA&Ox3, Major CN grossly intact.  Strength 5/5 equal bilat UE's and LE's.  DTR 2/4 equal bilat UE's and LE's.  No gross sensory deficits.  Normal cerebellar testing  bilat UE's (finger-nose) and LE's (heel-shin).  Speech clear.  No facial droop.  +left horizontal gaze fatigable nystagmus which reproduces pt's symptoms.;; Skin: Color normal, Warm, Dry.   ED Course  Procedures    MDM  MDM Reviewed: nursing note and vitals Interpretation: labs, ECG and CT scan    Date: 09/14/2012   Rate: 50  Rhythm: normal sinus rhythm  QRS Axis: normal  Intervals: normal  ST/T Wave abnormalities: normal  Conduction Disutrbances:none  Narrative Interpretation:   Old EKG Reviewed: none available.  Results for orders placed during the hospital encounter of 09/14/12  URINALYSIS, ROUTINE W REFLEX MICROSCOPIC      Component Value Range   Color, Urine YELLOW  YELLOW   APPearance CLEAR  CLEAR   Specific Gravity, Urine 1.025  1.005 - 1.030   pH 6.0  5.0 - 8.0   Glucose, UA NEGATIVE  NEGATIVE mg/dL   Hgb urine dipstick TRACE (*) NEGATIVE   Bilirubin Urine NEGATIVE  NEGATIVE   Ketones, ur NEGATIVE  NEGATIVE mg/dL   Protein, ur NEGATIVE  NEGATIVE mg/dL   Urobilinogen, UA 0.2  0.0 - 1.0 mg/dL   Nitrite NEGATIVE  NEGATIVE   Leukocytes, UA NEGATIVE  NEGATIVE  PREGNANCY, URINE      Component Value Range   Preg Test, Ur NEGATIVE  NEGATIVE  URINE MICROSCOPIC-ADD ON      Component Value Range   Squamous Epithelial / LPF FEW (*) RARE   WBC, UA 0-2  <3 WBC/hpf   RBC / HPF 0-2  <3 RBC/hpf   Bacteria, UA FEW (*) RARE   Ct Head Wo Contrast 09/14/2012  *RADIOLOGY REPORT*  Clinical Data:  Acute onset of dizziness.  CT HEAD WITHOUT CONTRAST  Technique: Contiguous axial images were obtained from the base of the skull through the vertex without contrast  Comparison: None  Findings:  There is no evidence of intracranial hemorrhage, brain edema, or other signs of acute infarction.  There is no evidence of intracranial mass lesion or mass effect.  No abnormal extraaxial fluid collections are identified.  There is no evidence of hydrocephalus, or other significant intracranial abnormality.  No skull abnormality identified.  Visualized sinuses are clear.  IMPRESSION: Negative non-contrast head CT.   Original Report Authenticated By: Danae Orleans, M.D.     I-stat chem:  Na 140, Cl 106, K 4.8, iCa 1.2, CO2 25, glu 90, BUN 11, Cr 0.9, Hgb 13.3, HCT 39.    1145:  Pt improved after meds and wants to go  home now.  Has tol PO well without N/V.  Has been watching TV and playing on her electronic handheld device without distress.  Dx and testing d/w pt.  Questions answered.  Verb understanding, agreeable to d/c home with outpt f/u.             Laray Anger, DO 09/16/12 2317

## 2012-09-15 LAB — POCT I-STAT, CHEM 8
Calcium, Ion: 1.2 mmol/L (ref 1.12–1.23)
Creatinine, Ser: 0.9 mg/dL (ref 0.50–1.10)
Glucose, Bld: 90 mg/dL (ref 70–99)
HCT: 39 % (ref 36.0–46.0)
Hemoglobin: 13.3 g/dL (ref 12.0–15.0)
TCO2: 25 mmol/L (ref 0–100)

## 2012-10-24 ENCOUNTER — Emergency Department (HOSPITAL_COMMUNITY)
Admission: EM | Admit: 2012-10-24 | Discharge: 2012-10-24 | Disposition: A | Discharge status: Home / Self Care | Payer: Medicaid Other | Attending: Emergency Medicine | Admitting: Emergency Medicine

## 2012-10-24 ENCOUNTER — Encounter (HOSPITAL_COMMUNITY): Payer: Self-pay | Admitting: Emergency Medicine

## 2012-10-24 ENCOUNTER — Emergency Department (HOSPITAL_COMMUNITY): Payer: Medicaid Other

## 2012-10-24 DIAGNOSIS — S99929A Unspecified injury of unspecified foot, initial encounter: Secondary | ICD-10-CM | POA: Insufficient documentation

## 2012-10-24 DIAGNOSIS — J45909 Unspecified asthma, uncomplicated: Secondary | ICD-10-CM | POA: Insufficient documentation

## 2012-10-24 DIAGNOSIS — Y9389 Activity, other specified: Secondary | ICD-10-CM | POA: Insufficient documentation

## 2012-10-24 DIAGNOSIS — Y929 Unspecified place or not applicable: Secondary | ICD-10-CM | POA: Insufficient documentation

## 2012-10-24 DIAGNOSIS — IMO0002 Reserved for concepts with insufficient information to code with codable children: Secondary | ICD-10-CM | POA: Insufficient documentation

## 2012-10-24 DIAGNOSIS — Z87891 Personal history of nicotine dependence: Secondary | ICD-10-CM | POA: Insufficient documentation

## 2012-10-24 DIAGNOSIS — X58XXXA Exposure to other specified factors, initial encounter: Secondary | ICD-10-CM | POA: Insufficient documentation

## 2012-10-24 DIAGNOSIS — M25562 Pain in left knee: Secondary | ICD-10-CM

## 2012-10-24 DIAGNOSIS — S8990XA Unspecified injury of unspecified lower leg, initial encounter: Secondary | ICD-10-CM | POA: Insufficient documentation

## 2012-10-24 DIAGNOSIS — M549 Dorsalgia, unspecified: Secondary | ICD-10-CM

## 2012-10-24 MED ORDER — METHOCARBAMOL 500 MG PO TABS: 500.0000 mg | ORAL_TABLET | Freq: Two times a day (BID) | ORAL | Status: DC

## 2012-10-24 MED ORDER — OXYCODONE-ACETAMINOPHEN 5-325 MG PO TABS: 1.0000 | ORAL_TABLET | Freq: Four times a day (QID) | ORAL | Status: DC | PRN

## 2012-10-24 MED ORDER — CYCLOBENZAPRINE HCL 10 MG PO TABS
10.0000 mg | ORAL_TABLET | Freq: Once | ORAL | Status: AC
  Administered 2012-10-24: 10 mg via ORAL
  Filled 2012-10-24: qty 1

## 2012-10-24 MED ORDER — METHOCARBAMOL 500 MG PO TABS
500.0000 mg | ORAL_TABLET | Freq: Once | ORAL | Status: AC
  Administered 2012-10-24: 500 mg via ORAL
  Filled 2012-10-24: qty 1

## 2012-10-24 MED ORDER — KETOROLAC TROMETHAMINE 60 MG/2ML IM SOLN
60.0000 mg | Freq: Once | INTRAMUSCULAR | Status: AC
  Administered 2012-10-24: 60 mg via INTRAMUSCULAR
  Filled 2012-10-24: qty 2

## 2012-10-24 MED ORDER — OXYCODONE-ACETAMINOPHEN 5-325 MG PO TABS
2.0000 | ORAL_TABLET | Freq: Once | ORAL | Status: AC
  Administered 2012-10-24: 2 via ORAL
  Filled 2012-10-24: qty 2

## 2012-10-24 NOTE — ED Provider Notes (Signed)
History     CSN: [id number]  Arrival date & time 10/24/12  1002   First MD Initiated Contact with Patient 10/24/12 1043      Chief Complaint  Patient presents with  . Leg Pain  . Back Pain    (Consider location/radiation/quality/duration/timing/severity/associated sxs/prior treatment) HPI Comments: Patient presents with lower back and left knee pain. Patient states that she was walking through a puddle near the local [employer] when she felt as if she was going to slip and fall. She states that to prevent this she twisted her body and think that she may have "pulled something". She states the pain has gotten gradually worse. Denies fevers or chills. Denies NVD or abdominal pain. Denies numbness or tingling. Denies bowel or bladder incontinence.   The history is provided by the patient. No language interpreter was used.    Past Medical History  Diagnosis Date  . Asthma   . Seasonal allergies     Past Surgical History  Procedure Date  . Tonsillectomy     History reviewed. No pertinent family history.  History  Substance Use Topics  . Smoking status: Former Smoker    Types: Cigarettes  . Smokeless tobacco: Not on file  . Alcohol Use: No    OB History    Grav Para Term Preterm Abortions TAB SAB Ect Mult Living   1               Review of Systems  Constitutional: Negative for fever and chills.  Gastrointestinal: Negative for nausea, vomiting, abdominal pain and diarrhea.  Musculoskeletal: Positive for myalgias and back pain.    Allergies  Review of patient's allergies indicates no known allergies.  Home Medications  No current outpatient prescriptions on file.  BP 120/86  Pulse 70  Temp 98.5 F (36.9 C) (Oral)  Resp 16  SpO2 100%  LMP 10/13/2012  Physical Exam  Nursing note and vitals reviewed. Constitutional: She appears well-developed and well-nourished.  HENT:  Head: Normocephalic and atraumatic.  Mouth/Throat: Oropharynx is clear and moist.    Eyes: Conjunctivae normal and EOM are normal. No scleral icterus.  Neck: Normal range of motion. Neck supple.  Cardiovascular: Normal rate, regular rhythm and normal heart sounds.   Pulmonary/Chest: Effort normal and breath sounds normal.  Abdominal: Soft. Bowel sounds are normal. There is no tenderness.  Musculoskeletal: She exhibits tenderness. She exhibits no edema.       Arms:      Tenderness to palpation of the left patella. Pain with internal and external rotation. No laxity on valgus or varus stress.    Neurological: She is alert. She has normal reflexes. She displays normal reflexes. She exhibits normal muscle tone. Coordination normal.  Skin: Skin is warm and dry.    ED Course  Procedures (including critical care time)  Labs Reviewed - No data to display No results found.   1. Left knee pain   2. Back pain       MDM  Patient presents with left knee pain and back pain. Given pain medication in ED with improvement. Imaging of knee unremarkable. Patient placed in knee sleeve with instructions to follow-up with ortho if not improved in 5 days. Discharged with short course of pain medication and ortho referral. Return precautions given. No red flags for cauda equina.        Pixie Casino, PA-C 10/24/12 1323

## 2012-10-24 NOTE — ED Provider Notes (Signed)
Medical screening examination/treatment/procedure(s) were performed by non-physician practitioner and as supervising physician I was immediately available for consultation/collaboration.  Marwan T Powers, MD 10/24/12 1645 

## 2012-10-24 NOTE — ED Notes (Signed)
Nurse Tech had rounded on the patient (noted that boyfriend had recently come into room to visit), and saw that patient was crying. She denied being in any pain, however.  When the boyfriend excused himself to use the bathroom, this RN asked patient directly (with door shut) if there were any issues we needed to be concerned of regarding her injuries. Patient stated that she had just gotten of the phone with her mom, and they had an argument. She stated that "at home, there are problems with getting along with her mother, but nothing was going on between her and her boyfriend".

## 2012-10-24 NOTE — ED Notes (Signed)
Pt states she was walking through puddle this am and lost balance.  Pt states she was able to recover herself, but states she has has lower back and into thigh.  Pt describes pain as shooting.  Pt able to move leg

## 2012-11-03 ENCOUNTER — Encounter (HOSPITAL_COMMUNITY): Payer: Self-pay | Admitting: *Deleted

## 2012-11-03 ENCOUNTER — Emergency Department (HOSPITAL_COMMUNITY)
Admission: EM | Admit: 2012-11-03 | Discharge: 2012-11-03 | Disposition: A | Discharge status: Home / Self Care | Payer: Self-pay | Attending: Emergency Medicine | Admitting: Emergency Medicine

## 2012-11-03 DIAGNOSIS — Z79899 Other long term (current) drug therapy: Secondary | ICD-10-CM | POA: Insufficient documentation

## 2012-11-03 DIAGNOSIS — Z3202 Encounter for pregnancy test, result negative: Secondary | ICD-10-CM | POA: Insufficient documentation

## 2012-11-03 DIAGNOSIS — M546 Pain in thoracic spine: Secondary | ICD-10-CM | POA: Insufficient documentation

## 2012-11-03 DIAGNOSIS — Z87891 Personal history of nicotine dependence: Secondary | ICD-10-CM | POA: Insufficient documentation

## 2012-11-03 DIAGNOSIS — M545 Low back pain, unspecified: Secondary | ICD-10-CM | POA: Insufficient documentation

## 2012-11-03 DIAGNOSIS — J45909 Unspecified asthma, uncomplicated: Secondary | ICD-10-CM | POA: Insufficient documentation

## 2012-11-03 MED ORDER — PREDNISONE 20 MG PO TABS: 60.0000 mg | ORAL_TABLET | Freq: Every day | ORAL | Status: DC

## 2012-11-03 MED ORDER — OXYCODONE-ACETAMINOPHEN 5-325 MG PO TABS: 1.0000 | ORAL_TABLET | ORAL | Status: AC | PRN

## 2012-11-03 MED ORDER — DEXAMETHASONE SODIUM PHOSPHATE 10 MG/ML IJ SOLN
10.0000 mg | Freq: Once | INTRAMUSCULAR | Status: AC
  Administered 2012-11-03: 10 mg via INTRAMUSCULAR
  Filled 2012-11-03: qty 1

## 2012-11-03 MED ORDER — OXYCODONE-ACETAMINOPHEN 5-325 MG PO TABS
1.0000 | ORAL_TABLET | Freq: Once | ORAL | Status: AC
  Administered 2012-11-03: 1 via ORAL
  Filled 2012-11-03: qty 1

## 2012-11-03 NOTE — ED Notes (Signed)
Back pain on 11/22, says she was about to fall and caught her self , jerking her back.  Seen at [facility]  , cont  To have  Pain.

## 2012-11-05 NOTE — ED Provider Notes (Cosign Needed)
History     CSN: [id number]  Arrival date & time 11/03/12  1533   First MD Initiated Contact with Patient 11/03/12 1701      Chief Complaint  Patient presents with  . Back Pain    (Consider location/radiation/quality/duration/timing/severity/associated sxs/prior treatment) HPI Comments: Danielle Zimmerman presents with persistent upper and lower back pain after slipping on a wet floor, catching herself before she fell,  But causing pain to her back and left knee (knee is better).  She was seen at [facility] for this injury 10 days ago at which time xrays were normal.  She was treated with oxycodone and robaxin which has not relieved her symptoms.  She has not received any orthopedic followup for this injury as recommended at her last visit.   There is no radiation of pain into her lower extremities.  There has been no weakness or numbness in the lower extremities and no urinary or bowel retention or incontinence.    The history is provided by the patient.    Past Medical History  Diagnosis Date  . Asthma   . Seasonal allergies     Past Surgical History  Procedure Date  . Tonsillectomy     History reviewed. No pertinent family history.  History  Substance Use Topics  . Smoking status: Former Smoker    Types: Cigarettes  . Smokeless tobacco: Not on file  . Alcohol Use: No    OB History    Grav Para Term Preterm Abortions TAB SAB Ect Mult Living   1               Review of Systems  Constitutional: Negative for fever.  Respiratory: Negative for shortness of breath.   Cardiovascular: Negative for chest pain and leg swelling.  Gastrointestinal: Negative for abdominal pain, constipation and abdominal distention.  Genitourinary: Negative for dysuria, urgency, frequency, flank pain and difficulty urinating.  Musculoskeletal: Positive for back pain. Negative for joint swelling and gait problem.  Skin: Negative for rash.  Neurological: Negative for weakness and numbness.     Allergies  Review of patient's allergies indicates no known allergies.  Home Medications   Current Outpatient Rx  Name  Route  Sig  Dispense  Refill  . METHOCARBAMOL 500 MG PO TABS   Oral   Take 1 tablet (500 mg total) by mouth 2 (two) times daily.   20 tablet   0   . OXYCODONE-ACETAMINOPHEN 5-325 MG PO TABS   Oral   Take 1 tablet by mouth every 6 (six) hours as needed for pain.   10 tablet   0   . OXYCODONE-ACETAMINOPHEN 5-325 MG PO TABS   Oral   Take 1 tablet by mouth every 4 (four) hours as needed for pain.   20 tablet   0   . PREDNISONE 20 MG PO TABS   Oral   Take 3 tablets (60 mg total) by mouth daily.   15 tablet   0     BP 140/92  Pulse 68  Temp 98.5 F (36.9 C) (Oral)  Resp 20  Ht 5\' 7"  (1.702 m)  Wt 230 lb (104.327 kg)  BMI 36.02 kg/m2  SpO2 99%  LMP 10/13/2012  Physical Exam  Nursing note and vitals reviewed. Constitutional: She appears well-developed and well-nourished.  HENT:  Head: Normocephalic.  Eyes: Conjunctivae normal are normal.  Neck: Normal range of motion. Neck supple.  Cardiovascular: Normal rate and intact distal pulses.  Pedal pulses normal.  Pulmonary/Chest: Effort normal.  Abdominal: Soft. Bowel sounds are normal. She exhibits no distension and no mass.  Musculoskeletal: Normal range of motion. She exhibits no edema.       Lumbar back: She exhibits tenderness. She exhibits no swelling, no edema and no spasm.       Paralumbar and parathoracic ttp,  No bony tenderness.    Neurological: She is alert. She has normal strength. She displays no atrophy and no tremor. No sensory deficit. Gait normal.  Reflex Scores:      Patellar reflexes are 2+ on the right side and 2+ on the left side.      Achilles reflexes are 2+ on the right side and 2+ on the left side.      No strength deficit noted in hip and knee flexor and extensor muscle groups.  Ankle flexion and extension intact.  Skin: Skin is warm and dry.  Psychiatric: She  has a normal mood and affect.    ED Course  Procedures (including critical care time)   Labs Reviewed  POCT PREGNANCY, URINE  LAB REPORT - SCANNED   No results found.   1. Low back pain     Patient given decadron injection.  MDM  Pt given refill on oxycodone,  Prednisone taper.  Referral to Dr. Romeo Apple for further management of her injury.  No neuro deficit on exam or by history to suggest emergent or surgical presentation.  Also discussed worsened sx that should prompt immediate re-evaluation including distal weakness, bowel/bladder retention/incontinence.              Burgess Amor, PA 11/05/12 1328

## 2012-11-08 ENCOUNTER — Encounter (HOSPITAL_COMMUNITY): Payer: Self-pay | Admitting: *Deleted

## 2012-11-08 ENCOUNTER — Emergency Department (HOSPITAL_COMMUNITY)
Admission: EM | Admit: 2012-11-08 | Discharge: 2012-11-08 | Disposition: A | Discharge status: Home / Self Care | Payer: No Typology Code available for payment source | Attending: Emergency Medicine | Admitting: Emergency Medicine

## 2012-11-08 DIAGNOSIS — J45909 Unspecified asthma, uncomplicated: Secondary | ICD-10-CM | POA: Insufficient documentation

## 2012-11-08 DIAGNOSIS — J309 Allergic rhinitis, unspecified: Secondary | ICD-10-CM | POA: Insufficient documentation

## 2012-11-08 DIAGNOSIS — Z79899 Other long term (current) drug therapy: Secondary | ICD-10-CM | POA: Insufficient documentation

## 2012-11-08 DIAGNOSIS — Y9229 Other specified public building as the place of occurrence of the external cause: Secondary | ICD-10-CM | POA: Insufficient documentation

## 2012-11-08 DIAGNOSIS — Y9301 Activity, walking, marching and hiking: Secondary | ICD-10-CM | POA: Insufficient documentation

## 2012-11-08 DIAGNOSIS — Z87891 Personal history of nicotine dependence: Secondary | ICD-10-CM | POA: Insufficient documentation

## 2012-11-08 DIAGNOSIS — M545 Low back pain: Secondary | ICD-10-CM

## 2012-11-08 DIAGNOSIS — T148XXA Other injury of unspecified body region, initial encounter: Secondary | ICD-10-CM

## 2012-11-08 DIAGNOSIS — X500XXA Overexertion from strenuous movement or load, initial encounter: Secondary | ICD-10-CM | POA: Insufficient documentation

## 2012-11-08 DIAGNOSIS — S335XXA Sprain of ligaments of lumbar spine, initial encounter: Secondary | ICD-10-CM | POA: Insufficient documentation

## 2012-11-08 MED ORDER — CYCLOBENZAPRINE HCL 10 MG PO TABS: 10.0000 mg | ORAL_TABLET | Freq: Three times a day (TID) | ORAL | Status: DC | PRN

## 2012-11-08 MED ORDER — DIAZEPAM 5 MG PO TABS
5.0000 mg | ORAL_TABLET | Freq: Once | ORAL | Status: AC
  Administered 2012-11-08: 5 mg via ORAL
  Filled 2012-11-08 (×2): qty 1

## 2012-11-08 MED ORDER — HYDROCODONE-ACETAMINOPHEN 5-325 MG PO TABS: ORAL_TABLET | ORAL | Status: DC

## 2012-11-08 MED ORDER — HYDROCODONE-ACETAMINOPHEN 5-325 MG PO TABS
1.0000 | ORAL_TABLET | Freq: Once | ORAL | Status: AC
  Administered 2012-11-08: 1 via ORAL
  Filled 2012-11-08 (×2): qty 1

## 2012-11-08 MED ORDER — IBUPROFEN 400 MG PO TABS
400.0000 mg | ORAL_TABLET | Freq: Once | ORAL | Status: AC
  Administered 2012-11-08: 400 mg via ORAL
  Filled 2012-11-08 (×2): qty 1

## 2012-11-08 MED ORDER — NAPROXEN 250 MG PO TABS: 250.0000 mg | ORAL_TABLET | Freq: Two times a day (BID) | ORAL | Status: DC

## 2012-11-08 NOTE — ED Notes (Signed)
Pt seen here on 11/22 after fall in [facility]. States was unable to go to Ortho referral because she has no money and does not have Medicaid. C/o continued neck and back pain.

## 2012-11-08 NOTE — ED Notes (Signed)
Reports falling on 11/22 and was seen then and had xrays done of her leg. Still having pain to entire left side of back. Ambulatory at triage.

## 2012-11-08 NOTE — ED Provider Notes (Signed)
History   This chart was scribed for Danielle Anger, DO by Gerlean Ren, ED Scribe. This patient was seen in room TR10C/TR10C and the patient's care was started at 11:18 AM    CSN: [id number]  Arrival date & time 11/08/12  1043   First MD Initiated Contact with Patient 11/08/12 1109      Chief Complaint  Patient presents with  . Fall  . Back Pain    The history is provided by the patient. No language interpreter was used.   ELLIS Zimmerman is a 21 y.o. female who presents to the Emergency Department complaining of  Per pt, c/o gradual onset and persistence of constant left sided back "pain" for the past 2 weeks.  Pt states the pain began after a near slip and fall on 10/24/12.  Pt states she was walking in a puddle, felt like she was going to fall, and "twisted herself" so she wouldn't fall.  Pt denies falling.  Pt has been ambulatory since the incident. Pain worsens with palpation of the area and body position changes. Pt has been eval in the ED after the incident, did not f/u with Ortho MD.  Denies new injury, no change in symptoms.  Denies incont/retention of bowel or bladder, no saddle anesthesia, no focal motor weakness, no tingling/numbness in extremities, no fevers, no abd pain.   The symptoms have been associated with no other complaints. The patient has a significant history of similar symptoms previously, recently being evaluated for this complaint and prior eval for same.    No PCP Past Medical History  Diagnosis Date  . Asthma   . Seasonal allergies     Past Surgical History  Procedure Date  . Tonsillectomy      History  Substance Use Topics  . Smoking status: Former Smoker    Types: Cigarettes  . Smokeless tobacco: Not on file  . Alcohol Use: No    OB History    Grav Para Term Preterm Abortions TAB SAB Ect Mult Living   1               Review of Systems ROS: Statement: All systems negative except as marked or noted in the HPI; Constitutional: Negative  for fever and chills. ; ; Eyes: Negative for eye pain, redness and discharge. ; ; ENMT: Negative for ear pain, hoarseness, nasal congestion, sinus pressure and sore throat. ; ; Cardiovascular: Negative for chest pain, palpitations, diaphoresis, dyspnea and peripheral edema. ; ; Respiratory: Negative for cough, wheezing and stridor. ; ; Gastrointestinal: Negative for nausea, vomiting, diarrhea, abdominal pain, blood in stool, hematemesis, jaundice and rectal bleeding. . ; ; Genitourinary: Negative for dysuria, flank pain and hematuria. ; ; Musculoskeletal: +LBP. Negative for neck pain. Negative for swelling and trauma.; ; Skin: Negative for pruritus, rash, abrasions, blisters, bruising and skin lesion.; ; Neuro: Negative for headache, lightheadedness and neck stiffness. Negative for weakness, altered level of consciousness , altered mental status, extremity weakness, paresthesias, involuntary movement, seizure and syncope.       Allergies  Review of patient's allergies indicates no known allergies.  Home Medications   Current Outpatient Rx  Name  Route  Sig  Dispense  Refill  . METHOCARBAMOL 500 MG PO TABS   Oral   Take 1 tablet (500 mg total) by mouth 2 (two) times daily.   20 tablet   0   . OXYCODONE-ACETAMINOPHEN 5-325 MG PO TABS   Oral   Take 1 tablet by mouth  every 6 (six) hours as needed for pain.   10 tablet   0   . OXYCODONE-ACETAMINOPHEN 5-325 MG PO TABS   Oral   Take 1 tablet by mouth every 4 (four) hours as needed for pain.   20 tablet   0   . PREDNISONE 20 MG PO TABS   Oral   Take 3 tablets (60 mg total) by mouth daily.   15 tablet   0     BP 140/86  Pulse 86  Temp 98.4 F (36.9 C) (Oral)  Resp 18  SpO2 100%  LMP 10/13/2012  Physical Exam 1125: Physical examination:  Nursing notes reviewed; Vital signs and O2 SAT reviewed;  Constitutional: Well developed, Well nourished, Well hydrated, In no acute distress; Head:  Normocephalic, atraumatic; Eyes: EOMI,  PERRL, No scleral icterus; ENMT: Mouth and pharynx normal, Mucous membranes moist; Neck: Supple, Full range of motion, No lymphadenopathy; Cardiovascular: Regular rate and rhythm, No murmur, rub, or gallop; Respiratory: Breath sounds clear & equal bilaterally, No rales, rhonchi, wheezes.  Speaking full sentences with ease, Normal respiratory effort/excursion; Chest: Nontender, Movement normal; Abdomen: Soft, Nontender, Nondistended, Normal bowel sounds; Genitourinary: No CVA tenderness; Spine:  No midline CS, TS, LS tenderness.  +TTP bilat lumbar paraspinal muscles;  Extremities: Pulses normal, No tenderness, No edema, No calf edema or asymmetry.; Neuro: AA&Ox3, Major CN grossly intact.  Speech clear. Strength 5/5 equal bilat UE's and LE's, including great toe dorsiflexion.  DTR 2/4 equal bilat UE's and LE's.  No gross sensory deficits.  Neg straight leg raises bilat. Gait steady. Climbs on and off stretcher easily by herself.;; Skin: Color normal, Warm, Dry.   ED Course  Procedures    MDM  MDM Reviewed: nursing note, previous chart and vitals   1125:  Pt has been eval in the ED x2 already for this complaint.  States she "can't afford" to be seen by the Orthopedist.  Strongly encouraged to f/u with PMD for good continuity of care and control of her symptoms.  Verb understanding.  Pt requesting "something stronger" than percocet.  Explained that I would only provide her with a rx for either vicodin or percocet, not just oxycodone.  Offered also to rx anti-inflammatory as well as muscle relaxer.  Verb understanding and agreeable.       I personally performed the services described in this documentation, which was scribed in my presence. The recorded information has been reviewed and is accurate.      Danielle Anger, DO 11/09/12 1513

## 2012-12-04 ENCOUNTER — Emergency Department (HOSPITAL_COMMUNITY)
Admission: EM | Admit: 2012-12-04 | Discharge: 2012-12-04 | Disposition: A | Discharge status: Home / Self Care | Payer: Self-pay | Attending: Emergency Medicine | Admitting: Emergency Medicine

## 2012-12-04 ENCOUNTER — Encounter (HOSPITAL_COMMUNITY): Payer: Self-pay | Admitting: Emergency Medicine

## 2012-12-04 ENCOUNTER — Emergency Department (HOSPITAL_COMMUNITY): Payer: Self-pay

## 2012-12-04 DIAGNOSIS — S91319A Laceration without foreign body, unspecified foot, initial encounter: Secondary | ICD-10-CM

## 2012-12-04 DIAGNOSIS — J45909 Unspecified asthma, uncomplicated: Secondary | ICD-10-CM | POA: Insufficient documentation

## 2012-12-04 DIAGNOSIS — S91309A Unspecified open wound, unspecified foot, initial encounter: Secondary | ICD-10-CM | POA: Insufficient documentation

## 2012-12-04 DIAGNOSIS — W268XXA Contact with other sharp object(s), not elsewhere classified, initial encounter: Secondary | ICD-10-CM | POA: Insufficient documentation

## 2012-12-04 DIAGNOSIS — F17200 Nicotine dependence, unspecified, uncomplicated: Secondary | ICD-10-CM | POA: Insufficient documentation

## 2012-12-04 DIAGNOSIS — Y92009 Unspecified place in unspecified non-institutional (private) residence as the place of occurrence of the external cause: Secondary | ICD-10-CM | POA: Insufficient documentation

## 2012-12-04 DIAGNOSIS — Y939 Activity, unspecified: Secondary | ICD-10-CM | POA: Insufficient documentation

## 2012-12-04 DIAGNOSIS — Z8709 Personal history of other diseases of the respiratory system: Secondary | ICD-10-CM | POA: Insufficient documentation

## 2012-12-04 DIAGNOSIS — Z3202 Encounter for pregnancy test, result negative: Secondary | ICD-10-CM | POA: Insufficient documentation

## 2012-12-04 DIAGNOSIS — Z23 Encounter for immunization: Secondary | ICD-10-CM | POA: Insufficient documentation

## 2012-12-04 MED ORDER — HYDROCODONE-ACETAMINOPHEN 5-325 MG PO TABS
1.0000 | ORAL_TABLET | Freq: Once | ORAL | Status: AC
  Administered 2012-12-04: 1 via ORAL
  Filled 2012-12-04: qty 1

## 2012-12-04 MED ORDER — HYDROCODONE-ACETAMINOPHEN 5-325 MG PO TABS: 1.0000 | ORAL_TABLET | ORAL | Status: DC | PRN

## 2012-12-04 MED ORDER — IBUPROFEN 400 MG PO TABS
800.0000 mg | ORAL_TABLET | Freq: Once | ORAL | Status: AC
  Administered 2012-12-04: 800 mg via ORAL
  Filled 2012-12-04: qty 2

## 2012-12-04 MED ORDER — TETANUS-DIPHTH-ACELL PERTUSSIS 5-2.5-18.5 LF-MCG/0.5 IM SUSP
0.5000 mL | Freq: Once | INTRAMUSCULAR | Status: AC
  Administered 2012-12-04: 0.5 mL via INTRAMUSCULAR
  Filled 2012-12-04: qty 0.5
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
## 2012-12-04 NOTE — ED Notes (Signed)
Returned from xray

## 2012-12-04 NOTE — ED Notes (Signed)
Patient transported to X-ray 

## 2012-12-04 NOTE — ED Provider Notes (Signed)
History     CSN: [id number]  Arrival date & time 12/04/12  6644   First MD Initiated Contact with Patient 12/04/12 1017      No chief complaint on file.   (Consider location/radiation/quality/duration/timing/severity/associated sxs/prior treatment) Patient is a 22 y.o. female presenting with skin laceration. The history is provided by the patient.  Laceration  The incident occurred 1 to 2 hours ago. The laceration is 2 cm in size. Injury mechanism: She stepped on broken glass while at home causing laceration to plantar left foot. Her tetanus status is out of date.    Past Medical History  Diagnosis Date  . Asthma   . Seasonal allergies     Past Surgical History  Procedure Date  . Tonsillectomy     No family history on file.  History  Substance Use Topics  . Smoking status: Current Every Day Smoker    Types: Cigarettes  . Smokeless tobacco: Not on file  . Alcohol Use: No    OB History    Grav Para Term Preterm Abortions TAB SAB Ect Mult Living   1               Review of Systems  Musculoskeletal: Negative.   Skin: Positive for wound.    Allergies  Review of patient's allergies indicates no known allergies.  Home Medications  No current outpatient prescriptions on file.  BP 137/79  Pulse 87  Temp 97.4 F (36.3 C) (Oral)  Resp 16  SpO2 99%  LMP 10/13/2012  Physical Exam  Constitutional: She is oriented to person, place, and time. She appears well-developed and well-nourished.  Neck: Normal range of motion.  Pulmonary/Chest: Effort normal.  Musculoskeletal:       2 cm laceration to plantar left foot overlying 1st MTP. No visualized or palpated FB's. No swelling.   Neurological: She is alert and oriented to person, place, and time.  Skin: Skin is warm and dry.  Psychiatric: She has a normal mood and affect.    ED Course  Procedures (including critical care time)  Labs Reviewed - No data to display No results found.  LACERATION  REPAIR Performed by: Elpidio Anis A Authorized by: Elpidio Anis A Consent: Verbal consent obtained. Risks and benefits: risks, benefits and alternatives were discussed Consent given by: patient Patient identity confirmed: provided demographic data Prepped and Draped in normal sterile fashion Wound explored  Laceration Location: left foot  Laceration Length: 2cm  No Foreign Bodies seen or palpated  Anesthesia: local infiltration  Local anesthetic: lidocaine n/a% n/a epinephrine  Anesthetic total: na/ ml  Irrigation method: syringe Amount of cleaning: standard  Skin closure: steristrip  Number of sutures: n/a  Technique: steristrip   Patient tolerance: Patient tolerated the procedure well with no immediate complications.  No diagnosis found.   1. Laceration foot  MDM  Uncomplicated laceration of plantar foot. No FB on x-rays - discussed possibility of retained FB with patient. Tetanus updated.        Arnoldo Hooker, PA-C 12/04/12 1158  Arnoldo Hooker, PA-C 12/18/12 1920

## 2012-12-19 NOTE — ED Provider Notes (Signed)
Medical screening examination/treatment/procedure(s) were performed by non-physician practitioner and as supervising physician I was immediately available for consultation/collaboration.    Jensen Kilburg L Chloe Flis, MD 12/19/12 2033 

## 2012-12-25 ENCOUNTER — Emergency Department (HOSPITAL_COMMUNITY)
Admission: EM | Admit: 2012-12-25 | Discharge: 2012-12-25 | Disposition: A | Discharge status: Home / Self Care | Payer: Self-pay | Attending: Emergency Medicine | Admitting: Emergency Medicine

## 2012-12-25 ENCOUNTER — Encounter (HOSPITAL_COMMUNITY): Payer: Self-pay | Admitting: Emergency Medicine

## 2012-12-25 DIAGNOSIS — Y990 Civilian activity done for income or pay: Secondary | ICD-10-CM | POA: Insufficient documentation

## 2012-12-25 DIAGNOSIS — S335XXA Sprain of ligaments of lumbar spine, initial encounter: Secondary | ICD-10-CM | POA: Insufficient documentation

## 2012-12-25 DIAGNOSIS — X503XXA Overexertion from repetitive movements, initial encounter: Secondary | ICD-10-CM | POA: Insufficient documentation

## 2012-12-25 DIAGNOSIS — Y9389 Activity, other specified: Secondary | ICD-10-CM | POA: Insufficient documentation

## 2012-12-25 DIAGNOSIS — Y9229 Other specified public building as the place of occurrence of the external cause: Secondary | ICD-10-CM | POA: Insufficient documentation

## 2012-12-25 DIAGNOSIS — F17200 Nicotine dependence, unspecified, uncomplicated: Secondary | ICD-10-CM | POA: Insufficient documentation

## 2012-12-25 DIAGNOSIS — Z3202 Encounter for pregnancy test, result negative: Secondary | ICD-10-CM | POA: Insufficient documentation

## 2012-12-25 DIAGNOSIS — J45909 Unspecified asthma, uncomplicated: Secondary | ICD-10-CM | POA: Insufficient documentation

## 2012-12-25 DIAGNOSIS — S39012A Strain of muscle, fascia and tendon of lower back, initial encounter: Secondary | ICD-10-CM

## 2012-12-25 DIAGNOSIS — E669 Obesity, unspecified: Secondary | ICD-10-CM | POA: Insufficient documentation

## 2012-12-25 DIAGNOSIS — Z79899 Other long term (current) drug therapy: Secondary | ICD-10-CM | POA: Insufficient documentation

## 2012-12-25 LAB — URINALYSIS, ROUTINE W REFLEX MICROSCOPIC
Bilirubin Urine: NEGATIVE
Glucose, UA: NEGATIVE mg/dL
Hgb urine dipstick: NEGATIVE
Ketones, ur: NEGATIVE mg/dL
Leukocytes, UA: NEGATIVE
Nitrite: NEGATIVE
Protein, ur: NEGATIVE mg/dL
Specific Gravity, Urine: 1.024 (ref 1.005–1.030)
Urobilinogen, UA: 0.2 mg/dL (ref 0.0–1.0)
pH: 7 (ref 5.0–8.0)

## 2012-12-25 MED ORDER — IBUPROFEN 800 MG PO TABS: 800.0000 mg | ORAL_TABLET | Freq: Three times a day (TID) | ORAL | Status: DC

## 2012-12-25 MED ORDER — KETOROLAC TROMETHAMINE 30 MG/ML IJ SOLN
30.0000 mg | Freq: Once | INTRAMUSCULAR | Status: AC
  Administered 2012-12-25: 30 mg via INTRAMUSCULAR
  Filled 2012-12-25: qty 1

## 2012-12-25 MED ORDER — OXYCODONE-ACETAMINOPHEN 5-325 MG PO TABS: ORAL_TABLET | ORAL | Status: DC

## 2012-12-25 MED ORDER — CYCLOBENZAPRINE HCL 5 MG PO TABS: 5.0000 mg | ORAL_TABLET | Freq: Three times a day (TID) | ORAL | Status: DC | PRN

## 2012-12-25 MED ORDER — OXYCODONE-ACETAMINOPHEN 5-325 MG PO TABS
1.0000 | ORAL_TABLET | Freq: Once | ORAL | Status: AC
  Administered 2012-12-25: 1 via ORAL
  Filled 2012-12-25: qty 1

## 2012-12-25 MED ORDER — PROMETHAZINE HCL 25 MG PO TABS: 25.0000 mg | ORAL_TABLET | Freq: Four times a day (QID) | ORAL | Status: DC | PRN

## 2012-12-25 NOTE — Discharge Instructions (Signed)
 Back Exercises Back exercises help treat and prevent back injuries. The goal of back exercises is to increase the strength of your abdominal and back muscles and the flexibility of your back. These exercises should be started when you no longer have back pain. Back exercises include:  Pelvic Tilt. Lie on your back with your knees bent. Tilt your pelvis until the lower part of your back is against the floor. Hold this position 5 to 10 sec and repeat 5 to 10 times.  Knee to Chest. Pull first 1 knee up against your chest and hold for 20 to 30 seconds, repeat this with the other knee, and then both knees. This may be done with the other leg straight or bent, whichever feels better.  Sit-Ups or Curl-Ups. Bend your knees 90 degrees. Start with tilting your pelvis, and do a partial, slow sit-up, lifting your trunk only 30 to 45 degrees off the floor. Take at least 2 to 3 seconds for each sit-up. Do not do sit-ups with your knees out straight. If partial sit-ups are difficult, simply do the above but with only tightening your abdominal muscles and holding it as directed.  Hip-Lift. Lie on your back with your knees flexed 90 degrees. Push down with your feet and shoulders as you raise your hips a couple inches off the floor; hold for 10 seconds, repeat 5 to 10 times.  Back arches. Lie on your stomach, propping yourself up on bent elbows. Slowly press on your hands, causing an arch in your low back. Repeat 3 to 5 times. Any initial stiffness and discomfort should lessen with repetition over time.  Shoulder-Lifts. Lie face down with arms beside your body. Keep hips and torso pressed to floor as you slowly lift your head and shoulders off the floor. Do not overdo your exercises, especially in the beginning. Exercises may cause you some mild back discomfort which lasts for a few minutes; however, if the pain is more severe, or lasts for more than 15 minutes, do not continue exercises until you see your caregiver.  Improvement with exercise therapy for back problems is slow.  See your caregivers for assistance with developing a proper back exercise program. Document Released: 12/27/2004 Document Revised: 02/11/2012 Document Reviewed: 09/20/2011 [facility] Patient Information 2013 [place], [place].    Back Pain, Adult Low back pain is very common. About 1 in 5 people have back pain.The cause of low back pain is rarely dangerous. The pain often gets better over time.About half of people with a sudden onset of back pain feel better in just 2 weeks. About 8 in 10 people feel better by 6 weeks.  CAUSES Some common causes of back pain include:  Strain of the muscles or ligaments supporting the spine.  Wear and tear (degeneration) of the spinal discs.  Arthritis.  Direct injury to the back. DIAGNOSIS Most of the time, the direct cause of low back pain is not known.However, back pain can be treated effectively even when the exact cause of the pain is unknown.Answering your caregiver's questions about your overall health and symptoms is one of the most accurate ways to make sure the cause of your pain is not dangerous. If your caregiver needs more information, he or she may order lab work or imaging tests (X-rays or MRIs).However, even if imaging tests show changes in your back, this usually does not require surgery. HOME CARE INSTRUCTIONS For many people, back pain returns.Since low back pain is rarely dangerous, it is often a condition  that people can learn to [facility] their own.   Remain active. It is stressful on the back to sit or stand in one place. Do not sit, drive, or stand in one place for more than 30 minutes at a time. Take short walks on level surfaces as soon as pain allows.Try to increase the length of time you walk each day.  Do not stay in bed.Resting more than 1 or 2 days can delay your recovery.  Do not avoid exercise or work.Your body is made to move.It is not dangerous to be  active, even though your back may hurt.Your back will likely heal faster if you return to being active before your pain is gone.  Pay attention to your body when you bend and lift. Many people have less discomfortwhen lifting if they bend their knees, keep the load close to their bodies,and avoid twisting. Often, the most comfortable positions are those that put less stress on your recovering back.  Find a comfortable position to sleep. Use a firm mattress and lie on your side with your knees slightly bent. If you lie on your back, put a pillow under your knees.  Only take over-the-counter or prescription medicines as directed by your caregiver. Over-the-counter medicines to reduce pain and inflammation are often the most helpful.Your caregiver may prescribe muscle relaxant drugs.These medicines help dull your pain so you can more quickly return to your normal activities and healthy exercise.  Put ice on the injured area.  Put ice in a plastic bag.  Place a towel between your skin and the bag.  Leave the ice on for 15 to 20 minutes, 3 to 4 times a day for the first 2 to 3 days. After that, ice and heat may be alternated to reduce pain and spasms.  Ask your caregiver about trying back exercises and gentle massage. This may be of some benefit.  Avoid feeling anxious or stressed.Stress increases muscle tension and can worsen back pain.It is important to recognize when you are anxious or stressed and learn ways to manage it.Exercise is a great option. SEEK MEDICAL CARE IF:  You have pain that is not relieved with rest or medicine.  You have pain that does not improve in 1 week.  You have new symptoms.  You are generally not feeling well. SEEK IMMEDIATE MEDICAL CARE IF:   You have pain that radiates from your back into your legs.  You develop new bowel or bladder control problems.  You have unusual weakness or numbness in your arms or legs.  You develop nausea or  vomiting.  You develop abdominal pain.  You feel faint. Document Released: 11/19/2005 Document Revised: 05/20/2012 Document Reviewed: 04/09/2011 [facility] Patient Information 2013 [place], [place].    Narcotic and benzodiazepine use may cause drowsiness, slowed breathing or dependence.  Please use with caution and do not drive, operate machinery or watch young children alone while taking them.  Taking combinations of these medications or drinking alcohol will potentiate these effects.

## 2012-12-25 NOTE — ED Provider Notes (Signed)
History     CSN: [id number]  Arrival date & time 12/25/12  1128   First MD Initiated Contact with Patient 12/25/12 1341      Chief Complaint  Patient presents with  . Back Pain    (Consider location/radiation/quality/duration/timing/severity/associated sxs/prior treatment) HPI Comments: Pt reports strained her back a few months ago in November when she slipped while at work.  She has been seen a few times for same symptoms, occasionally a re-exacerbation of low back pain.  She had symptoms go down left leg with initial injury, but that has resolved.   She has taken ibuprofen with no sig improvement.  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  Bending, turning makes pain worse.  No urinary difficulty.  No radiation of pain down leg.  Denies dysuria.  No abd pain, N/V/D.  No fevers, chills.    Patient is a 22 y.o. female presenting with back pain. The history is provided by the patient and a friend.  Back Pain  Pertinent negatives include no fever, no numbness, no abdominal pain, no dysuria and no weakness.    Past Medical History  Diagnosis Date  . Asthma   . Seasonal allergies     Past Surgical History  Procedure Date  . Tonsillectomy     History reviewed. No pertinent family history.  History  Substance Use Topics  . Smoking status: Current Every Day Smoker    Types: Cigarettes  . Smokeless tobacco: Not on file  . Alcohol Use: No    OB History    Grav Para Term Preterm Abortions TAB SAB Ect Mult Living   1               Review of Systems  Constitutional: Negative for fever and chills.  Gastrointestinal: Negative for nausea, vomiting and abdominal pain.  Genitourinary: Negative for dysuria, flank pain and difficulty urinating.  Musculoskeletal: Positive for back pain.  Skin: Negative for rash and wound.  Neurological: Negative for weakness  and numbness.    Allergies  Hydrocodone  Home Medications   Current Outpatient Rx  Name  Route  Sig  Dispense  Refill  . IBUPROFEN 200 MG PO TABS   Oral   Take 600 mg by mouth every 6 (six) hours as needed. For pain         . CYCLOBENZAPRINE HCL 5 MG PO TABS   Oral   Take 1 tablet (5 mg total) by mouth 3 (three) times daily as needed for muscle spasms.   20 tablet   0   . IBUPROFEN 800 MG PO TABS   Oral   Take 1 tablet (800 mg total) by mouth 3 (three) times daily.   21 tablet   0   . OXYCODONE-ACETAMINOPHEN 5-325 MG PO TABS      1-2 tablets po q 6 hours prn moderate to severe pain   20 tablet   0   . PROMETHAZINE HCL 25 MG PO TABS   Oral   Take 1 tablet (25 mg total) by mouth every 6 (six) hours as needed for nausea.   30 tablet   0   . PROMETHAZINE HCL 25 MG PO TABS   Oral   Take 1 tablet (25 mg total) by mouth every 6 (six) hours as needed for nausea.   20 tablet   0  BP 121/77  Pulse 71  Temp 98.5 F (36.9 C) (Oral)  SpO2 100%  LMP 10/13/2012  Physical Exam  Nursing note and vitals reviewed. Constitutional: She is oriented to person, place, and time. She appears well-developed and well-nourished.  HENT:  Head: Normocephalic and atraumatic.  Eyes: No scleral icterus.  Pulmonary/Chest: Effort normal. No respiratory distress.  Abdominal: Soft.  Musculoskeletal:       Lumbar back: She exhibits tenderness. She exhibits no bony tenderness, no deformity, no laceration, no pain, no spasm and normal pulse.       Back:  Neurological: She is alert and oriented to person, place, and time. No sensory deficit. She exhibits normal muscle tone. GCS eye subscore is 4. GCS verbal subscore is 5. GCS motor subscore is 6.  Skin: Skin is warm, dry and intact. No rash noted. No pallor.    ED Course  Procedures (including critical care time)   Labs Reviewed  URINALYSIS, ROUTINE W REFLEX MICROSCOPIC  POCT PREGNANCY, URINE   No results found.   1. Lumbar  strain     ra sat is 100% and i interpret to be normal  MDM  I reviewed prior EPIC notes.  I suspect pt due to larger chest, obesity, pt is not fully healing from her initial back strain injury and with her job, she is re-aggravating it.  I counseled her on conservative measure, rest, exercises and referred her to [facility] for re-evaluation and possibly PT.  Rx for NSAID, analgesics and muscle relaxant provided adn she is encouraged to find a outpt clinic to see for continue problems.         Gavin Pound. Albirta Rhinehart, MD 12/25/12 1743

## 2012-12-25 NOTE — ED Notes (Signed)
Patient with complaints of generalized abdominal pain and left ear pain

## 2012-12-25 NOTE — ED Notes (Signed)
Pt reports back pain, no menstrual period since Nov and cough, nausea hot flashes, vomiting. No vomiting in triage.

## 2013-02-12 ENCOUNTER — Encounter (HOSPITAL_COMMUNITY): Payer: Self-pay | Admitting: Cardiology

## 2013-02-12 ENCOUNTER — Emergency Department (HOSPITAL_COMMUNITY)
Admission: EM | Admit: 2013-02-12 | Discharge: 2013-02-12 | Discharge status: Against Medical Advice | Payer: No Typology Code available for payment source | Attending: Emergency Medicine | Admitting: Emergency Medicine

## 2013-02-12 DIAGNOSIS — H538 Other visual disturbances: Secondary | ICD-10-CM | POA: Insufficient documentation

## 2013-02-12 NOTE — ED Notes (Addendum)
Pt st's she feels too bad to wait.  Encouraged pt to stay that wait would not be long.  Pt continues to say she just wants to go home and go to bed.

## 2013-02-12 NOTE — ED Notes (Signed)
Pt reports headache for the past 4 days. States today she has had some blurred vision and had to leave school. States she is light sensitive. No neuro deficits noted.

## 2013-05-04 ENCOUNTER — Encounter (HOSPITAL_COMMUNITY): Payer: Self-pay | Admitting: Emergency Medicine

## 2013-05-04 ENCOUNTER — Emergency Department (HOSPITAL_COMMUNITY)
Admission: EM | Admit: 2013-05-04 | Discharge: 2013-05-05 | Disposition: A | Discharge status: Home / Self Care | Payer: Self-pay | Attending: Emergency Medicine | Admitting: Emergency Medicine

## 2013-05-04 DIAGNOSIS — J45909 Unspecified asthma, uncomplicated: Secondary | ICD-10-CM | POA: Insufficient documentation

## 2013-05-04 DIAGNOSIS — H9209 Otalgia, unspecified ear: Secondary | ICD-10-CM | POA: Insufficient documentation

## 2013-05-04 DIAGNOSIS — F17200 Nicotine dependence, unspecified, uncomplicated: Secondary | ICD-10-CM | POA: Insufficient documentation

## 2013-05-04 DIAGNOSIS — Z8709 Personal history of other diseases of the respiratory system: Secondary | ICD-10-CM | POA: Insufficient documentation

## 2013-05-04 DIAGNOSIS — H9201 Otalgia, right ear: Secondary | ICD-10-CM

## 2013-05-04 NOTE — ED Notes (Signed)
PT. REPORTS LEFT AND RIGHT EAR ACHE ONSET 5 DAYS AGO , DENIES INJURY , SLIGHT DRAINAGE .

## 2013-05-05 LAB — RAPID STREP SCREEN (MED CTR MEBANE ONLY): Streptococcus, Group A Screen (Direct): NEGATIVE

## 2013-05-05 MED ORDER — ANTIPYRINE-BENZOCAINE 5.4-1.4 % OT SOLN
3.0000 [drp] | OTIC | Status: DC | PRN
Start: 2013-05-05 — End: 2013-05-20

## 2013-05-05 MED ORDER — KETOROLAC TROMETHAMINE 60 MG/2ML IM SOLN
60.0000 mg | Freq: Once | INTRAMUSCULAR | Status: AC
  Administered 2013-05-05: 60 mg via INTRAMUSCULAR
  Filled 2013-05-05: qty 2

## 2013-05-05 MED ORDER — IBUPROFEN 800 MG PO TABS: 800.0000 mg | ORAL_TABLET | Freq: Three times a day (TID) | ORAL | Status: DC

## 2013-05-05 NOTE — ED Provider Notes (Signed)
Medical screening examination/treatment/procedure(s) were performed by non-physician practitioner and as supervising physician I was immediately available for consultation/collaboration.  Olivia Mackie, MD 05/05/13 [phone]

## 2013-05-05 NOTE — ED Provider Notes (Signed)
History     CSN: [id number]  Arrival date & time 05/04/13  2101   First MD Initiated Contact with Patient 05/04/13 2337      Chief Complaint  Patient presents with  . Otalgia    (Consider location/radiation/quality/duration/timing/severity/associated sxs/prior treatment) HPI History provided by pt.   Pt has had pain in her right ear x 5 days.  Felt a pop and had temporary hearing impairment, and developed the pain shortly after.  It has since spread to right jaw and throat, and pain is now most prominent in throat.  Has mild pain in L ear as well.  No fever, otorrhea, nasal congestion, rhinorrhea.  Has had a cough.  No PMH.   Past Medical History  Diagnosis Date  . Asthma   . Seasonal allergies     Past Surgical History  Procedure Laterality Date  . Tonsillectomy      No family history on file.  History  Substance Use Topics  . Smoking status: Current Every Day Smoker    Types: Cigarettes  . Smokeless tobacco: Not on file  . Alcohol Use: No    OB History   Grav Para Term Preterm Abortions TAB SAB Ect Mult Living   1               Review of Systems  All other systems reviewed and are negative.    Allergies  Hydrocodone  Home Medications   Current Outpatient Rx  Name  Route  Sig  Dispense  Refill  . Acetaminophen (TYLENOL PO)   Oral   Take 1-2 tablets by mouth every 6 (six) hours as needed (fever or pain).         Marland Kitchen antipyrine-benzocaine (AURALGAN) otic solution   Both Ears   Place 3 drops into both ears every 2 (two) hours as needed for pain.   10 mL   0   . ibuprofen (ADVIL,MOTRIN) 800 MG tablet   Oral   Take 1 tablet (800 mg total) by mouth 3 (three) times daily.   12 tablet   0     BP 119/68  Pulse 88  Temp(Src) 98.1 F (36.7 C) (Oral)  Resp 18  SpO2 99%  LMP 04/03/2013  Physical Exam  Nursing note and vitals reviewed. Constitutional: She is oriented to person, place, and time. She appears well-developed and well-nourished. No  distress.  HENT:  Head: Normocephalic and atraumatic.  Nml TM and EAC bilaterally.  No pain w/ palpation R external ear nor edema/tenderness of R mastoid or jaw.  Mild tenderness along R eustachian tube.  Normal dentition.  No erythema, edema or exudate of tonsils/soft palate/posterior pharynx.   Eyes:  Normal appearance  Neck: Normal range of motion.  Cardiovascular: Normal rate and regular rhythm.   Pulmonary/Chest: Effort normal and breath sounds normal. No respiratory distress.  Musculoskeletal: Normal range of motion.  Lymphadenopathy:    She has no cervical adenopathy.  Neurological: She is alert and oriented to person, place, and time.  Skin: Skin is warm and dry. No rash noted.  Psychiatric: She has a normal mood and affect. Her behavior is normal.    ED Course  Procedures (including critical care time)  Labs Reviewed  RAPID STREP SCREEN  CULTURE, GROUP A STREP   No results found.   1. Otalgia of right ear       MDM  22yo healthy F presents w/ R ear pain and sore throat.  Strep screen obtained in triage and is  negative.  Based on exam, suspect eustachian tube dysfunction.  Treated pain in ED w/ IM toradol and prescribed 800mg  ibuprofen and auralgan.  Recommended sudafed as well.  Return precautions discussed.         Otilio Miu, PA-C 05/05/13 [phone]

## 2013-05-07 ENCOUNTER — Telehealth (HOSPITAL_COMMUNITY): Payer: Self-pay | Admitting: Emergency Medicine

## 2013-05-07 NOTE — ED Notes (Signed)
Post ED Visit - Positive Culture Follow-up: Successful Patient Follow-Up  Culture assessed and recommendations reviewed by: []  Wes Dulaney, Pharm.D., BCPS []  Celedonio Miyamoto, Pharm.D., BCPS [x]  Georgina Pillion, Pharm.D., BCPS []  [place], [place].D., BCPS, AAHIVP []  Estella Husk, Pharm.D., BCPS, AAHIVP  Positive Group A Strep culture  [x]  Patient discharged without antimicrobial prescription and treatment is now indicated []  Organism is resistant to prescribed ED discharge antimicrobial []  Patient with positive blood cultures  Changes discussed with ED provider: Trixie Dredge PA-C New antibiotic prescription: Penicillin VK 500 mg twice daily x 10 days   Kylie A Holland 05/07/2013, 12:07 PM

## 2013-05-07 NOTE — Progress Notes (Signed)
ED Antimicrobial Stewardship Positive Culture Follow Up   Danielle Zimmerman is an 22 y.o. female who presented to [facility] on 05/04/2013 with a chief complaint of R ear pain x 5 days with pain spreading to R jaw and throat >> pain more prominent in throat currently.   Chief Complaint  Patient presents with  . Otalgia    Recent Results (from the past 720 hour(s))  RAPID STREP SCREEN     Status: None   Collection Time    05/05/13 12:01 AM      Result Value Range Status   Streptococcus, Group A Screen (Direct) NEGATIVE  NEGATIVE Final   Comment: (NOTE)     A Rapid Antigen test may result negative if the antigen level in the     sample is below the detection level of this test. The FDA has not     cleared this test as a stand-alone test therefore the rapid antigen     negative result has reflexed to a Group A Strep culture.  CULTURE, GROUP A STREP     Status: None   Collection Time    05/05/13 12:01 AM      Result Value Range Status   Specimen Description THROAT   Final   Special Requests ADDED 0026   Final   Culture     Final   Value: GROUP A STREP (S.PYOGENES) ISOLATED NO SUSPICIOUS COLONIES, CONTINUING TO HOLD   Report Status 05/06/2013 FINAL   Final    []  Treated with, organism resistant to prescribed antimicrobial [x]  Patient discharged originally without antimicrobial agent and treatment is now indicated  New antibiotic prescription: Penicillin VK 500 mg twice daily for 10 days  ED Provider: Trixie Dredge, PA-C  Rolley Sims 05/07/2013, 11:17 AM Infectious Diseases Pharmacist Phone# [phone]

## 2013-05-08 ENCOUNTER — Telehealth (HOSPITAL_COMMUNITY): Payer: Self-pay | Admitting: Emergency Medicine

## 2013-05-09 ENCOUNTER — Telehealth (HOSPITAL_COMMUNITY): Payer: Self-pay | Admitting: Emergency Medicine

## 2013-05-12 LAB — CULTURE, GROUP A STREP

## 2013-05-20 ENCOUNTER — Emergency Department (HOSPITAL_COMMUNITY)
Admission: EM | Admit: 2013-05-20 | Discharge: 2013-05-20 | Disposition: A | Discharge status: Home / Self Care | Payer: Self-pay | Attending: Emergency Medicine | Admitting: Emergency Medicine

## 2013-05-20 ENCOUNTER — Encounter (HOSPITAL_COMMUNITY): Payer: Self-pay | Admitting: Emergency Medicine

## 2013-05-20 DIAGNOSIS — N898 Other specified noninflammatory disorders of vagina: Secondary | ICD-10-CM | POA: Insufficient documentation

## 2013-05-20 DIAGNOSIS — N926 Irregular menstruation, unspecified: Secondary | ICD-10-CM

## 2013-05-20 DIAGNOSIS — Z3202 Encounter for pregnancy test, result negative: Secondary | ICD-10-CM | POA: Insufficient documentation

## 2013-05-20 DIAGNOSIS — R51 Headache: Secondary | ICD-10-CM | POA: Insufficient documentation

## 2013-05-20 DIAGNOSIS — J45909 Unspecified asthma, uncomplicated: Secondary | ICD-10-CM | POA: Insufficient documentation

## 2013-05-20 DIAGNOSIS — F17200 Nicotine dependence, unspecified, uncomplicated: Secondary | ICD-10-CM | POA: Insufficient documentation

## 2013-05-20 DIAGNOSIS — Z79899 Other long term (current) drug therapy: Secondary | ICD-10-CM | POA: Insufficient documentation

## 2013-05-20 DIAGNOSIS — B9689 Other specified bacterial agents as the cause of diseases classified elsewhere: Secondary | ICD-10-CM

## 2013-05-20 DIAGNOSIS — E669 Obesity, unspecified: Secondary | ICD-10-CM | POA: Insufficient documentation

## 2013-05-20 DIAGNOSIS — N939 Abnormal uterine and vaginal bleeding, unspecified: Secondary | ICD-10-CM | POA: Insufficient documentation

## 2013-05-20 DIAGNOSIS — R110 Nausea: Secondary | ICD-10-CM | POA: Insufficient documentation

## 2013-05-20 DIAGNOSIS — N760 Acute vaginitis: Secondary | ICD-10-CM | POA: Insufficient documentation

## 2013-05-20 LAB — COMPREHENSIVE METABOLIC PANEL
ALT: 19 U/L (ref 0–35)
BUN: 11 mg/dL (ref 6–23)
CO2: 28 mEq/L (ref 19–32)
Calcium: 9.7 mg/dL (ref 8.4–10.5)
Creatinine, Ser: 0.82 mg/dL (ref 0.50–1.10)
GFR calc Af Amer: >90 mL/min (ref >90)
GFR calc non Af Amer: >90 mL/min (ref >90)
Glucose, Bld: 82 mg/dL (ref 70–99)
Total Protein: 7.4 g/dL (ref 6.0–8.3)

## 2013-05-20 LAB — CBC WITH DIFFERENTIAL/PLATELET
Eosinophils Absolute: 0.4 10*3/uL (ref 0.0–0.7)
Eosinophils Relative: 5 % (ref 0–5)
HCT: 36.7 % (ref 36.0–46.0)
Lymphocytes Relative: 39 % (ref 12–46)
Lymphs Abs: 3.2 10*3/uL (ref 0.7–4.0)
MCH: 30.5 pg (ref 26.0–34.0)
MCV: 88.2 fL (ref 78.0–100.0)
Monocytes Absolute: 0.5 10*3/uL (ref 0.1–1.0)
Monocytes Relative: 6 % (ref 3–12)
RBC: 4.16 MIL/uL (ref 3.87–5.11)
WBC: 8.1 10*3/uL (ref 4.0–10.5)

## 2013-05-20 LAB — URINE MICROSCOPIC-ADD ON

## 2013-05-20 LAB — WET PREP, GENITAL: Trich, Wet Prep: NONE SEEN

## 2013-05-20 LAB — URINALYSIS, ROUTINE W REFLEX MICROSCOPIC
Bilirubin Urine: NEGATIVE
Glucose, UA: NEGATIVE mg/dL
Nitrite: POSITIVE — AB
Specific Gravity, Urine: 1.024 (ref 1.005–1.030)
pH: 7 (ref 5.0–8.0)

## 2013-05-20 LAB — LIPASE, BLOOD: Lipase: 19 U/L (ref 11–59)

## 2013-05-20 MED ORDER — SODIUM CHLORIDE 0.9 % IV BOLUS (SEPSIS)
1000.0000 mL | Freq: Once | INTRAVENOUS | Status: AC
  Administered 2013-05-20: 1000 mL via INTRAVENOUS

## 2013-05-20 MED ORDER — OXYCODONE-ACETAMINOPHEN 5-325 MG PO TABS
1.0000 | ORAL_TABLET | Freq: Once | ORAL | Status: AC
  Administered 2013-05-20: 1 via ORAL
  Filled 2013-05-20: qty 1

## 2013-05-20 MED ORDER — ACETAMINOPHEN 325 MG PO TABS
650.0000 mg | ORAL_TABLET | Freq: Once | ORAL | Status: AC
  Administered 2013-05-20: 650 mg via ORAL
  Filled 2013-05-20: qty 2

## 2013-05-20 MED ORDER — ONDANSETRON HCL 4 MG/2ML IJ SOLN
4.0000 mg | Freq: Once | INTRAMUSCULAR | Status: AC
  Administered 2013-05-20: 4 mg via INTRAVENOUS
  Filled 2013-05-20: qty 2

## 2013-05-20 MED ORDER — METRONIDAZOLE 500 MG PO TABS: 500.0000 mg | ORAL_TABLET | Freq: Two times a day (BID) | ORAL | Status: DC

## 2013-05-20 NOTE — ED Notes (Signed)
Pt. Stated, I didn't have a period in June and I might be pregnant.  Ive had some spotting, headache, nauseated since the June 9.

## 2013-05-20 NOTE — ED Provider Notes (Signed)
History     CSN: [id number]  Arrival date & time 05/20/13  1431   First MD Initiated Contact with Patient 05/20/13 1705      Chief Complaint  Patient presents with  . Dizziness  . Headache    (Consider location/radiation/quality/duration/timing/severity/associated sxs/prior treatment) The history is provided by the patient and medical records. No language interpreter was used.    Danielle Zimmerman is a 22 y.o. female  with a hx of asthma presents to the Emergency Department complaining of gradual, persistent, progressively worsening generalized headaches, vaginal spotting, nausea beginning 2 weeks ago.  She states these are the same symptoms that she had when she was previously pregnant.  She also admits to a missed menstrual cycle 3 weeks ago. Sleep makes her symptoms better and heat makes it worse.  Pt denies fever, chills, abdominal pain, V/D, weakness, dizziness, chest pain, SOB, dysuria, hematuria.  Pt states she is drinking only 3 cups of water per day because she is in school.   [id number]   Past Medical History  Diagnosis Date  . Asthma   . Seasonal allergies     Past Surgical History  Procedure Laterality Date  . Tonsillectomy      No family history on file.  History  Substance Use Topics  . Smoking status: Current Every Day Smoker    Types: Cigarettes  . Smokeless tobacco: Not on file  . Alcohol Use: No    OB History   Grav Para Term Preterm Abortions TAB SAB Ect Mult Living   1               Review of Systems  Constitutional: Negative for fever, diaphoresis, appetite change, fatigue and unexpected weight change.  HENT: Negative for mouth sores and neck stiffness.   Eyes: Negative for visual disturbance.  Respiratory: Negative for cough, chest tightness, shortness of breath and wheezing.   Cardiovascular: Negative for chest pain.  Gastrointestinal: Negative for nausea, vomiting, abdominal pain, diarrhea and constipation.  Endocrine: Negative for  polydipsia, polyphagia and polyuria.  Genitourinary: Positive for vaginal discharge and menstrual problem. Negative for dysuria, urgency, frequency and hematuria.  Musculoskeletal: Negative for back pain.  Skin: Negative for rash.  Allergic/Immunologic: Negative for immunocompromised state.  Neurological: Positive for headaches. Negative for syncope and light-headedness.  Hematological: Does not bruise/bleed easily.  Psychiatric/Behavioral: Negative for sleep disturbance. The patient is not nervous/anxious.     Allergies  Hydrocodone  Home Medications   Current Outpatient Rx  Name  Route  Sig  Dispense  Refill  . ibuprofen (ADVIL,MOTRIN) 600 MG tablet   Oral   Take 1,200 mg by mouth every 6 (six) hours as needed for pain.         . metroNIDAZOLE (FLAGYL) 500 MG tablet   Oral   Take 1 tablet (500 mg total) by mouth 2 (two) times daily. One po bid x 7 days   14 tablet   0     BP 104/62  Pulse 58  Temp(Src) 98.2 F (36.8 C) (Oral)  Resp 18  SpO2 100%  LMP 04/06/2013  Physical Exam  Nursing note and vitals reviewed. Constitutional: She is oriented to person, place, and time. She appears well-developed and well-nourished. No distress.  HENT:  Head: Normocephalic and atraumatic.  Mouth/Throat: Oropharynx is clear and moist. No oropharyngeal exudate.  Eyes: Conjunctivae are normal. Pupils are equal, round, and reactive to light. No scleral icterus.  Neck: Normal range of motion. Neck supple.  Cardiovascular: Normal rate,  regular rhythm, normal heart sounds and intact distal pulses.   No murmur heard. Pulmonary/Chest: Effort normal and breath sounds normal. No respiratory distress. She has no wheezes. She has no rales.  Abdominal: Soft. Normal appearance and bowel sounds are normal. She exhibits no distension and no mass. There is no hepatosplenomegaly. There is no tenderness. There is no rebound, no guarding and no CVA tenderness. Hernia confirmed negative in the right  inguinal area and confirmed negative in the left inguinal area.  Obese  Genitourinary: Uterus normal. Pelvic exam was performed with patient supine. There is no rash, tenderness or lesion on the right labia. There is no rash, tenderness or lesion on the left labia. Uterus is not deviated, not enlarged, not fixed and not tender. Cervix exhibits no motion tenderness, no discharge and no friability. Right adnexum displays no mass, no tenderness and no fullness. Left adnexum displays no mass, no tenderness and no fullness. No erythema, tenderness or bleeding around the vagina. No foreign body around the vagina. No signs of injury around the vagina. Vaginal discharge (moderate, thick, white) found.  Musculoskeletal: Normal range of motion. She exhibits no edema.  Lymphadenopathy:    She has no cervical adenopathy.       Right: No inguinal adenopathy present.       Left: No inguinal adenopathy present.  Neurological: She is alert and oriented to person, place, and time. She exhibits normal muscle tone. Coordination normal.  Speech is clear and goal oriented Moves extremities without ataxia  Skin: Skin is warm and dry. No rash noted. She is not diaphoretic. No erythema.  Psychiatric: She has a normal mood and affect.    ED Course  Procedures (including critical care time)  Labs Reviewed  WET PREP, GENITAL - Abnormal; Notable for the following:    Clue Cells Wet Prep HPF POC FEW (*)    WBC, Wet Prep HPF POC FEW (*)    All other components within normal limits  URINALYSIS, ROUTINE W REFLEX MICROSCOPIC - Abnormal; Notable for the following:    APPearance HAZY (*)    Nitrite POSITIVE (*)    Leukocytes, UA TRACE (*)    All other components within normal limits  URINE MICROSCOPIC-ADD ON - Abnormal; Notable for the following:    Squamous Epithelial / LPF FEW (*)    Bacteria, UA MANY (*)    All other components within normal limits  GC/CHLAMYDIA PROBE AMP  URINE CULTURE  CBC WITH DIFFERENTIAL   COMPREHENSIVE METABOLIC PANEL  LIPASE, BLOOD  POCT PREGNANCY, URINE   No results found.   1. BV (bacterial vaginosis)   2. Abnormal menstrual cycle       MDM  Danielle Zimmerman presents with concern that she might be pregnant and with a missed menstrual cycle.  Pt pregnancy test negative.  Pt symptoms likely 2/2 dehydration.  Pelvic exam and wet prep without indication of STDs.  Pt understands that they have GC/Chlamydia cultures pending and that they will need to inform all sexual partners if results return positive. Pt not concerning for PID because hemodynamically stable and no cervical motion tenderness on pelvic exam. Pt has been treated with flagyl for Bacterial Vaginosis. Pt has been advised to not drink alcohol while on this medication. Patient to be discharged with instructions to follow up with OBGYN for abnormal menstrual cycle. Discussed importance of using protection when sexually active.        Dahlia Client Damyiah Moxley, PA-C 05/20/13 2351

## 2013-05-20 NOTE — ED Notes (Signed)
Phlebotomy at bedside.

## 2013-05-20 NOTE — ED Notes (Signed)
Family at bedside. 

## 2013-05-21 LAB — GC/CHLAMYDIA PROBE AMP: GC Probe RNA: NEGATIVE

## 2013-05-21 NOTE — ED Provider Notes (Signed)
Medical screening examination/treatment/procedure(s) were performed by non-physician practitioner and as supervising physician I was immediately available for consultation/collaboration.  Lyanne Co, MD 05/21/13 [phone]

## 2013-05-22 LAB — URINE CULTURE

## 2013-05-23 NOTE — Progress Notes (Signed)
ED Antimicrobial Stewardship Positive Culture Follow Up   Danielle Zimmerman is an 22 y.o. female who presented to [facility] on 05/20/2013 with a chief complaint of  Chief Complaint  Patient presents with  . Dizziness  . Headache    Recent Results (from the past 720 hour(s))  RAPID STREP SCREEN     Status: None   Collection Time    05/05/13 12:01 AM      Result Value Range Status   Streptococcus, Group A Screen (Direct) NEGATIVE  NEGATIVE Final   Comment: (NOTE)     A Rapid Antigen test may result negative if the antigen level in the     sample is below the detection level of this test. The FDA has not     cleared this test as a stand-alone test therefore the rapid antigen     negative result has reflexed to a Group A Strep culture.  CULTURE, GROUP A STREP     Status: None   Collection Time    05/05/13 12:01 AM      Result Value Range Status   Specimen Description THROAT   Final   Special Requests ADDED 0026   Final   Culture GROUP A STREP (S.PYOGENES) ISOLATED   Final   Report Status 05/12/2013 FINAL   Final  WET PREP, GENITAL     Status: Abnormal   Collection Time    05/20/13  6:08 PM      Result Value Range Status   Yeast Wet Prep HPF POC NONE SEEN  NONE SEEN Final   Trich, Wet Prep NONE SEEN  NONE SEEN Final   Clue Cells Wet Prep HPF POC FEW (*) NONE SEEN Final   WBC, Wet Prep HPF POC FEW (*) NONE SEEN Final  GC/CHLAMYDIA PROBE AMP     Status: None   Collection Time    05/20/13  6:08 PM      Result Value Range Status   CT Probe RNA NEGATIVE  NEGATIVE Final   GC Probe RNA NEGATIVE  NEGATIVE Final   Comment: (NOTE)                                                                                              **Normal Reference Range: Negative**          Assay performed using the Gen-Probe APTIMA COMBO2 (R) Assay.     Acceptable specimen types for this assay include APTIMA Swabs (Unisex,     endocervical, urethral, or vaginal), first void urine, and ThinPrep     liquid  based cytology samples.  URINE CULTURE     Status: None   Collection Time    05/20/13  7:55 PM      Result Value Range Status   Specimen Description URINE, CLEAN CATCH   Final   Special Requests NONE   Final   Culture  Setup Time 05/20/2013 20:21   Final   Colony Count >=100,000 COLONIES/ML   Final   Culture ESCHERICHIA COLI   Final   Report Status 05/22/2013 FINAL   Final   Organism ID, Bacteria ESCHERICHIA COLI  Final     [x]  Patient discharged originally without antimicrobial agent for UTI and treatment is now indicated  Only received treatment for bacterial vaginosis in ED  New antibiotic prescription: cephalexin 500mg  PO TID x 7 days  ED Provider: Arthor Captain, PA-C   Laurence Slate 05/23/2013, 12:46 PM Infectious Diseases Pharmacist Phone# [phone]

## 2013-05-24 ENCOUNTER — Telehealth (HOSPITAL_COMMUNITY): Payer: Self-pay | Admitting: Emergency Medicine

## 2013-05-24 NOTE — ED Notes (Signed)
Post ED Visit - Positive Culture Follow-up: Successful Patient Follow-Up  Culture assessed and recommendations reviewed by: []  Wes Dulaney, Pharm.D., BCPS []  Celedonio Miyamoto, Pharm.D., BCPS []  Georgina Pillion, [address].D., BCPS []  [place], [place].D., BCPS, AAHIVP []  Estella Husk, Pharm.D., BCPS, AAHIVP [x]  Laurence Slate, [address].D., BCPS  Positive urine culture  [x]  Patient discharged without antimicrobial prescription and treatment is now indicated []  Organism is resistant to prescribed ED discharge antimicrobial []  Patient with positive blood cultures  Changes discussed with ED provider: Arthor Captain PA-C New antibiotic prescription: Cephalexin 500 mg TID x 7 days    Kylie A Holland 05/24/2013, 10:20 AM

## 2013-05-28 ENCOUNTER — Telehealth (HOSPITAL_COMMUNITY): Payer: Self-pay | Admitting: Emergency Medicine

## 2013-05-31 ENCOUNTER — Telehealth (HOSPITAL_COMMUNITY): Payer: Self-pay | Admitting: Emergency Medicine

## 2013-05-31 NOTE — ED Notes (Signed)
Unable to contact patient via phone. Sent letter. °

## 2013-06-13 ENCOUNTER — Telehealth (HOSPITAL_COMMUNITY): Payer: Self-pay | Admitting: Emergency Medicine

## 2013-06-13 NOTE — ED Notes (Signed)
No response to letter sent after 30 days. Chart sent to Medical Records. °

## 2013-07-06 NOTE — ED Notes (Signed)
No response after 30 days .Chart closed out and sent to Medical Records.  

## 2013-07-17 ENCOUNTER — Emergency Department (HOSPITAL_COMMUNITY)
Admission: EM | Admit: 2013-07-17 | Discharge: 2013-07-17 | Disposition: A | Discharge status: Home / Self Care | Payer: Self-pay | Attending: Emergency Medicine | Admitting: Emergency Medicine

## 2013-07-17 ENCOUNTER — Encounter (HOSPITAL_COMMUNITY): Payer: Self-pay | Admitting: *Deleted

## 2013-07-17 DIAGNOSIS — F17200 Nicotine dependence, unspecified, uncomplicated: Secondary | ICD-10-CM | POA: Insufficient documentation

## 2013-07-17 DIAGNOSIS — M545 Low back pain, unspecified: Secondary | ICD-10-CM | POA: Insufficient documentation

## 2013-07-17 DIAGNOSIS — M549 Dorsalgia, unspecified: Secondary | ICD-10-CM

## 2013-07-17 DIAGNOSIS — J45909 Unspecified asthma, uncomplicated: Secondary | ICD-10-CM | POA: Insufficient documentation

## 2013-07-17 MED ORDER — CYCLOBENZAPRINE HCL 10 MG PO TABS: 10.0000 mg | ORAL_TABLET | Freq: Two times a day (BID) | ORAL | Status: DC | PRN

## 2013-07-17 MED ORDER — DIAZEPAM 5 MG PO TABS
5.0000 mg | ORAL_TABLET | Freq: Once | ORAL | Status: AC
  Administered 2013-07-17: 5 mg via ORAL
  Filled 2013-07-17: qty 1

## 2013-07-17 MED ORDER — KETOROLAC TROMETHAMINE 60 MG/2ML IM SOLN
60.0000 mg | Freq: Once | INTRAMUSCULAR | Status: AC
  Administered 2013-07-17: 60 mg via INTRAMUSCULAR
  Filled 2013-07-17: qty 2

## 2013-07-17 MED ORDER — NAPROXEN 500 MG PO TABS: 500.0000 mg | ORAL_TABLET | Freq: Two times a day (BID) | ORAL | Status: DC

## 2013-07-17 NOTE — ED Notes (Signed)
The pt hashad some lower back pain for 3-4 days.  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

## 2013-07-17 NOTE — ED Provider Notes (Signed)
CSN: [id number]     Arrival date & time 07/17/13  1804 History    This chart was scribed for Emilia Beck, PA working with Shon Baton, MD by Quintella Reichert, ED Scribe. This patient was seen in room TR08C/TR08C and the patient's care was started at 8:08 PM.     Chief Complaint  Patient presents with  . Back Pain    Patient is a 22 y.o. female presenting with back pain. The history is provided by the patient. No language interpreter was used.  Back Pain Location:  Lumbar spine Radiates to:  Does not radiate Pain severity:  Moderate Onset quality:  Gradual Duration:  4 days Progression:  Worsening Chronicity:  New Context: lifting heavy objects   Relieved by:  Nothing Worsened by:  Movement Ineffective treatments: Tylenol, Excedrin. Associated symptoms: no bladder incontinence, no bowel incontinence, no numbness, no paresthesias, no perianal numbness and no tingling     HPI Comments: ARLIE POSCH is a 22 y.o. female who presents to the Emergency Department complaining of constant, moderate, non-radiating lower back pain for the past 3-4 days that began after she was lifting heavy objects at work.  Pain is exacerbated by movement.  She also complains of some neck stiffness.  She has attempted to treat pain with Excedrin and Tylenol, without relief.  She has not applied heat to the area.  She denies bowel or bladder incontinence, perianal numbness or tingling.     Past Medical History  Diagnosis Date  . Asthma   . Seasonal allergies     Past Surgical History  Procedure Laterality Date  . Tonsillectomy      No family history on file.   History  Substance Use Topics  . Smoking status: Current Every Day Smoker    Types: Cigarettes  . Smokeless tobacco: Not on file  . Alcohol Use: No    OB History   Grav Para Term Preterm Abortions TAB SAB Ect Mult Living   1                Review of Systems  Gastrointestinal: Negative for bowel incontinence.   Genitourinary: Negative for bladder incontinence.  Musculoskeletal: Positive for back pain.  Neurological: Negative for tingling, numbness and paresthesias.  All other systems reviewed and are negative.      Allergies  Hydrocodone  Home Medications   Current Outpatient Rx  Name  Route  Sig  Dispense  Refill  . acetaminophen (TYLENOL) 500 MG tablet   Oral   Take 1,000 mg by mouth every 6 (six) hours as needed for pain.          BP 120/79  Pulse 84  Temp(Src) 98.4 F (36.9 C)  Resp 20  SpO2 98%  LMP 07/06/2013  Physical Exam  Nursing note and vitals reviewed. Constitutional: She is oriented to person, place, and time. She appears well-developed and well-nourished. No distress.  HENT:  Head: Normocephalic and atraumatic.  Eyes: EOM are normal.  Neck: Neck supple. No tracheal deviation present.  Cardiovascular: Normal rate.   Pulmonary/Chest: Effort normal. No respiratory distress.  Musculoskeletal: Normal range of motion.       Lumbar back: She exhibits tenderness and bony tenderness.  Tenderness to palpation of midline lumbar spine and lumbar paraspinal tenderness to palpation.  Neurological: She is alert and oriented to person, place, and time.  Sensation equal to lower extremities.  Skin: Skin is warm and dry.  Psychiatric: She has a normal mood and affect.  Her behavior is normal.    ED Course  Procedures (including critical care time)  DIAGNOSTIC STUDIES: Oxygen Saturation is 98% on room air, normal by my interpretation.    COORDINATION OF CARE: 8:11 PM-Discussed treatment plan which includes pain medication, muscle relaxants and anti-inflammatories with pt at bedside and pt agreed to plan.    Labs Reviewed - No data to display No results found.  1. Back pain     MDM  8:37 PM Patient likely experiencing muscular pain. Patient given toradol and valium here. Patient will be discharged with naprosyn and flexeril for pain. Patient will have a resource  guide to follow up with a PCP.     I personally performed the services described in this documentation, which was scribed in my presence. The recorded information has been reviewed and is accurate.    Emilia Beck, PA-C 07/17/13 2040

## 2013-07-18 NOTE — ED Provider Notes (Signed)
Medical screening examination/treatment/procedure(s) were performed by non-physician practitioner and as supervising physician I was immediately available for consultation/collaboration.  Gabryelle Whitmoyer F Siddarth Hsiung, MD 07/18/13 0808 

## 2013-07-29 ENCOUNTER — Encounter (HOSPITAL_COMMUNITY): Payer: Self-pay | Admitting: *Deleted

## 2013-07-29 ENCOUNTER — Emergency Department (HOSPITAL_COMMUNITY)
Admission: EM | Admit: 2013-07-29 | Discharge: 2013-07-29 | Disposition: A | Discharge status: Home / Self Care | Payer: Self-pay | Attending: Emergency Medicine | Admitting: Emergency Medicine

## 2013-07-29 DIAGNOSIS — J069 Acute upper respiratory infection, unspecified: Secondary | ICD-10-CM | POA: Insufficient documentation

## 2013-07-29 DIAGNOSIS — F17200 Nicotine dependence, unspecified, uncomplicated: Secondary | ICD-10-CM | POA: Insufficient documentation

## 2013-07-29 DIAGNOSIS — J45909 Unspecified asthma, uncomplicated: Secondary | ICD-10-CM | POA: Insufficient documentation

## 2013-07-29 MED ORDER — GUAIFENESIN-CODEINE 100-10 MG/5ML PO SOLN: 5.0000 mL | Freq: Three times a day (TID) | ORAL | Status: DC | PRN

## 2013-07-29 MED ORDER — AZITHROMYCIN 250 MG PO TABS: 250.0000 mg | ORAL_TABLET | Freq: Every day | ORAL | Status: DC

## 2013-07-29 NOTE — ED Provider Notes (Signed)
Medical screening examination/treatment/procedure(s) were performed by non-physician practitioner and as supervising physician I was immediately available for consultation/collaboration.   Brennyn Haisley S Cashawn Yanko, MD 07/29/13 1950 

## 2013-07-29 NOTE — ED Notes (Signed)
Patient complains of chest congestion, head congestion.

## 2013-07-29 NOTE — ED Notes (Signed)
Pt reports nausea, body aches and nasal congestion x 3 days.  Pt congested.  No acute distress.

## 2013-07-29 NOTE — ED Provider Notes (Signed)
CSN: [id number]     Arrival date & time 07/29/13  1131 History   First MD Initiated Contact with Patient 07/29/13 1137     Chief Complaint  Patient presents with  . URI   (Consider location/radiation/quality/duration/timing/severity/associated sxs/prior Treatment) HPI  MADDYX VALLIE is a 22 y.o.female without any significant PMH presents to the ER with complaints of nausea, body aches and nasal congestion. One of the family members in her household was recently diagnosed with pneumonia. She has not been having any fevers and her cough/sinus congestion are her most bothersome symptoms. She is otherwise healthy and denies feeling weak, having vomiting or diarrhea.    Past Medical History  Diagnosis Date  . Asthma   . Seasonal allergies    Past Surgical History  Procedure Laterality Date  . Tonsillectomy     History reviewed. No pertinent family history. History  Substance Use Topics  . Smoking status: Current Every Day Smoker    Types: Cigarettes  . Smokeless tobacco: Not on file  . Alcohol Use: No   OB History   Grav Para Term Preterm Abortions TAB SAB Ect Mult Living   1              Review of Systems ROS is negative unless otherwise stated in the HPI  Allergies  Hydrocodone  Home Medications   Current Outpatient Rx  Name  Route  Sig  Dispense  Refill  . acetaminophen (TYLENOL) 500 MG tablet   Oral   Take 1,000 mg by mouth every 6 (six) hours as needed for pain.         . cyclobenzaprine (FLEXERIL) 10 MG tablet   Oral   Take 1 tablet (10 mg total) by mouth 2 (two) times daily as needed for muscle spasms.   20 tablet   0   . naproxen (NAPROSYN) 500 MG tablet   Oral   Take 1 tablet (500 mg total) by mouth 2 (two) times daily with a meal.   30 tablet   0   . azithromycin (ZITHROMAX) 250 MG tablet   Oral   Take 1 tablet (250 mg total) by mouth daily. Take first 2 tablets together, then 1 every day until finished.   6 tablet   0   .  guaiFENesin-codeine 100-10 MG/5ML syrup   Oral   Take 5 mLs by mouth 3 (three) times daily as needed for cough.   120 mL   0    BP 152/92  Pulse 95  Temp(Src) 99 F (37.2 C)  Resp 18  SpO2 99%  LMP 07/06/2013 Physical Exam  Nursing note and vitals reviewed. Constitutional: She appears well-developed and well-nourished. No distress.  HENT:  Head: Normocephalic and atraumatic.  Right Ear: Tympanic membrane and ear canal normal.  Left Ear: Tympanic membrane and ear canal normal.  Nose: Rhinorrhea present.  Mouth/Throat: Uvula is midline and oropharynx is clear and moist.  Eyes: Pupils are equal, round, and reactive to light.  Neck: Normal range of motion. Neck supple. No Brudzinski's sign and no Kernig's sign noted.  Cardiovascular: Normal rate and regular rhythm.   Pulmonary/Chest: Effort normal. She has no wheezes. She has no rhonchi.  Coughing during exam  Abdominal: Soft.  Neurological: She is alert.  Skin: Skin is warm and dry.    ED Course  Procedures (including critical care time) Labs Review Labs Reviewed - No data to display Imaging Review No results found.  MDM   1. URI (upper respiratory infection)  Due to patient around someone with pneumonia will treat for URI with abx and give cough medication.   22 y.o.Kaelee L Erven's evaluation in the Emergency Department is complete. It has been determined that no acute conditions requiring further emergency intervention are present at this time. The patient/guardian have been advised of the diagnosis and plan. We have discussed signs and symptoms that warrant return to the ED, such as changes or worsening in symptoms.  Vital signs are stable at discharge. Filed Vitals:   07/29/13 1135  BP: 152/92  Pulse: 95  Temp: 99 F (37.2 C)  Resp: 18    Patient/guardian has voiced understanding and agreed to follow-up with the PCP or specialist.     Dorthula Matas, PA-C 07/29/13 1300

## 2013-09-03 ENCOUNTER — Encounter (HOSPITAL_COMMUNITY): Payer: Self-pay | Admitting: Emergency Medicine

## 2013-09-03 ENCOUNTER — Emergency Department (HOSPITAL_COMMUNITY)
Admission: EM | Admit: 2013-09-03 | Discharge: 2013-09-03 | Disposition: A | Discharge status: Home / Self Care | Payer: No Typology Code available for payment source | Attending: Emergency Medicine | Admitting: Emergency Medicine

## 2013-09-03 DIAGNOSIS — R110 Nausea: Secondary | ICD-10-CM

## 2013-09-03 DIAGNOSIS — R197 Diarrhea, unspecified: Secondary | ICD-10-CM | POA: Insufficient documentation

## 2013-09-03 DIAGNOSIS — F17200 Nicotine dependence, unspecified, uncomplicated: Secondary | ICD-10-CM | POA: Insufficient documentation

## 2013-09-03 DIAGNOSIS — R51 Headache: Secondary | ICD-10-CM | POA: Insufficient documentation

## 2013-09-03 DIAGNOSIS — J45909 Unspecified asthma, uncomplicated: Secondary | ICD-10-CM | POA: Insufficient documentation

## 2013-09-03 DIAGNOSIS — R1084 Generalized abdominal pain: Secondary | ICD-10-CM | POA: Insufficient documentation

## 2013-09-03 DIAGNOSIS — Z792 Long term (current) use of antibiotics: Secondary | ICD-10-CM | POA: Insufficient documentation

## 2013-09-03 DIAGNOSIS — Z3202 Encounter for pregnancy test, result negative: Secondary | ICD-10-CM | POA: Insufficient documentation

## 2013-09-03 DIAGNOSIS — Z791 Long term (current) use of non-steroidal anti-inflammatories (NSAID): Secondary | ICD-10-CM | POA: Insufficient documentation

## 2013-09-03 DIAGNOSIS — R6883 Chills (without fever): Secondary | ICD-10-CM | POA: Insufficient documentation

## 2013-09-03 DIAGNOSIS — R112 Nausea with vomiting, unspecified: Secondary | ICD-10-CM | POA: Insufficient documentation

## 2013-09-03 LAB — COMPREHENSIVE METABOLIC PANEL
Alkaline Phosphatase: 81 U/L (ref 39–117)
BUN: 10 mg/dL (ref 6–23)
CO2: 23 mEq/L (ref 19–32)
Chloride: 102 mEq/L (ref 96–112)
GFR calc Af Amer: >90 mL/min (ref >90)
GFR calc non Af Amer: >90 mL/min (ref >90)
Glucose, Bld: 107 mg/dL — ABNORMAL HIGH (ref 70–99)
Potassium: 3.9 mEq/L (ref 3.5–5.1)
Total Bilirubin: 0.1 mg/dL — ABNORMAL LOW (ref 0.3–1.2)

## 2013-09-03 LAB — LIPASE, BLOOD: Lipase: 30 U/L (ref 11–59)

## 2013-09-03 LAB — URINALYSIS, ROUTINE W REFLEX MICROSCOPIC
Bilirubin Urine: NEGATIVE
Ketones, ur: NEGATIVE mg/dL
Nitrite: NEGATIVE
Urobilinogen, UA: 1 mg/dL (ref 0.0–1.0)
pH: 6 (ref 5.0–8.0)

## 2013-09-03 LAB — CBC WITH DIFFERENTIAL/PLATELET
Hemoglobin: 12.1 g/dL (ref 12.0–15.0)
Lymphs Abs: 2.2 10*3/uL (ref 0.7–4.0)
Monocytes Relative: 7 % (ref 3–12)
Neutro Abs: 3 10*3/uL (ref 1.7–7.7)
Neutrophils Relative %: 49 % (ref 43–77)
RBC: 3.95 MIL/uL (ref 3.87–5.11)

## 2013-09-03 MED ORDER — ONDANSETRON 4 MG PO TBDP: 4.0000 mg | ORAL_TABLET | Freq: Three times a day (TID) | ORAL | Status: DC | PRN

## 2013-09-03 MED ORDER — KETOROLAC TROMETHAMINE 30 MG/ML IJ SOLN
30.0000 mg | Freq: Once | INTRAMUSCULAR | Status: AC
  Administered 2013-09-03: 30 mg via INTRAVENOUS
  Filled 2013-09-03: qty 1

## 2013-09-03 MED ORDER — SODIUM CHLORIDE 0.9 % IV BOLUS (SEPSIS)
1000.0000 mL | Freq: Once | INTRAVENOUS | Status: AC
  Administered 2013-09-03: 1000 mL via INTRAVENOUS

## 2013-09-03 MED ORDER — ONDANSETRON HCL 4 MG/2ML IJ SOLN
4.0000 mg | Freq: Once | INTRAMUSCULAR | Status: AC
  Administered 2013-09-03: 4 mg via INTRAVENOUS
  Filled 2013-09-03: qty 2

## 2013-09-03 MED ORDER — FAMOTIDINE 20 MG PO TABS: 20.0000 mg | ORAL_TABLET | Freq: Two times a day (BID) | ORAL | Status: DC

## 2013-09-03 NOTE — ED Notes (Signed)
Pt ambulatory to BR without any problems.

## 2013-09-03 NOTE — ED Provider Notes (Signed)
CSN: [id number]     Arrival date & time 09/03/13  4098 History   First MD Initiated Contact with Patient 09/03/13 0617     Chief Complaint  Patient presents with  . Emesis  . Diarrhea  . Headache   (Consider location/radiation/quality/duration/timing/severity/associated sxs/prior Treatment) HPI Comments: Patient with no PMH of abdominal surgeries - presents with complaint of 5 days of soft/watery, nonbloody diarrhea and nausea. She's been able to eat and drink. She denies fever. Patient has generalized abdominal pain. No urinary symptoms. Prior to her current symptoms, she had an upper respiratory tract infection that included runny nose, sore throat that improved. Patient also has a headache without any weakness, numbness, or tingling. No neck pain. Patient has irregular menstrual periods, LMP 08/07/2013. No treatments prior to arrival. No sick contacts or travel. No heavy NSAID use. No history of ulcers. Denies alcohol use. The onset of this condition was acute. The course is constant. Aggravating factors: none. Alleviating factors: none.    Patient is a 22 y.o. female presenting with vomiting, diarrhea, and headaches. The history is provided by the patient.  Emesis Associated symptoms: abdominal pain, chills and headaches   Associated symptoms: no diarrhea, no myalgias and no sore throat   Diarrhea Associated symptoms: abdominal pain, chills, headaches and vomiting   Associated symptoms: no fever and no myalgias   Headache Associated symptoms: abdominal pain, nausea and vomiting   Associated symptoms: no cough, no diarrhea, no fever, no myalgias and no sore throat     Past Medical History  Diagnosis Date  . Asthma   . Seasonal allergies    Past Surgical History  Procedure Laterality Date  . Tonsillectomy     No family history on file. History  Substance Use Topics  . Smoking status: Current Some Day Smoker    Types: Cigarettes  . Smokeless tobacco: Not on file  . Alcohol  Use: No   OB History   Grav Para Term Preterm Abortions TAB SAB Ect Mult Living   1              Review of Systems  Constitutional: Positive for chills. Negative for fever.  HENT: Negative for sore throat and rhinorrhea.   Eyes: Negative for redness.  Respiratory: Negative for cough.   Cardiovascular: Negative for chest pain.  Gastrointestinal: Positive for nausea, vomiting and abdominal pain. Negative for diarrhea.  Genitourinary: Negative for dysuria.  Musculoskeletal: Negative for myalgias.  Skin: Negative for rash.  Neurological: Positive for headaches.    Allergies  Hydrocodone  Home Medications   Current Outpatient Rx  Name  Route  Sig  Dispense  Refill  . acetaminophen (TYLENOL) 500 MG tablet   Oral   Take 1,000 mg by mouth every 6 (six) hours as needed for pain.         Marland Kitchen azithromycin (ZITHROMAX) 250 MG tablet   Oral   Take 1 tablet (250 mg total) by mouth daily. Take first 2 tablets together, then 1 every day until finished.   6 tablet   0   . cyclobenzaprine (FLEXERIL) 10 MG tablet   Oral   Take 1 tablet (10 mg total) by mouth 2 (two) times daily as needed for muscle spasms.   20 tablet   0   . guaiFENesin-codeine 100-10 MG/5ML syrup   Oral   Take 5 mLs by mouth 3 (three) times daily as needed for cough.   120 mL   0   . naproxen (NAPROSYN) 500 MG  tablet   Oral   Take 1 tablet (500 mg total) by mouth 2 (two) times daily with a meal.   30 tablet   0    BP 112/69  Pulse 64  Temp(Src) 98.1 F (36.7 C) (Oral)  Resp 14  SpO2 100%  LMP 08/07/2013 Physical Exam  Nursing note and vitals reviewed. Constitutional: She appears well-developed and well-nourished.  HENT:  Head: Normocephalic and atraumatic.  Eyes: Conjunctivae are normal. Right eye exhibits no discharge. Left eye exhibits no discharge.  Neck: Normal range of motion. Neck supple.  Cardiovascular: Normal rate, regular rhythm and normal heart sounds.   No murmur  heard. Pulmonary/Chest: Effort normal and breath sounds normal. No respiratory distress.  Abdominal: Soft. Bowel sounds are normal. There is tenderness (generalized, mild). There is no rebound.  Neurological: She is alert.  Skin: Skin is warm and dry.  Psychiatric: She has a normal mood and affect.    ED Course  Procedures (including critical care time) Labs Review Labs Reviewed  CBC WITH DIFFERENTIAL - Abnormal; Notable for the following:    HCT 35.9 (*)    Eosinophils Relative 6 (*)    All other components within normal limits  COMPREHENSIVE METABOLIC PANEL - Abnormal; Notable for the following:    Glucose, Bld 107 (*)    Total Bilirubin 0.1 (*)    All other components within normal limits  LIPASE, BLOOD  URINALYSIS, ROUTINE W REFLEX MICROSCOPIC  POCT PREGNANCY, URINE   Imaging Review No results found.  6:42 AM Patient seen and examined. Work-up initiated. Fluids ordered.   Vital signs reviewed and are as follows: Filed Vitals:   09/03/13 0635  BP: 112/69  Pulse: 64  Temp:   Resp: 14   8:04 AM Pt informed of all results. She continues to complain of nausea and abdominal pain. Meds ordered.  9:27 AM Patient is still complaining of nausea. She has not vomited. I have offered additional antiemetics, however is ready for discharge and refuses.   The patient was urged to return to the Emergency Department immediately with worsening of current symptoms, worsening abdominal pain, persistent vomiting, blood noted in stools, fever, or any other concerns. The patient verbalized understanding.       MDM   1. Diarrhea   2. Nausea    Patient with nausea and diarrhea. Vitals are stable, no fever.  No signs of dehydration, tolerating PO's.  Lungs are clear.  No focal abdominal pain, no concern for appendicitis, cholecystitis, pancreatitis, ruptured viscus, UTI, kidney stone, or any other abdominal etiology.  Labwork is normal, reassuring. Supportive therapy indicated with return  if symptoms worsen.  Patient counseled.      Renne Crigler, PA-C 09/03/13 [phone]

## 2013-09-03 NOTE — ED Notes (Signed)
Per Pt, pt states she has been ill for the past three days, pt c/o N/V/D, non-productive cough for five days, body aches, chills/hot flashes, and a HA. Pt states she is very nauseous and feels like she needs to have diarrhea but is unable to at this time. Pt reports abdominal pain in her lower abdomen. Pt states her last menstrual cycle was September 5, however her cycle has been irregular, pt states she did not have her cycle in August. Pt is a&O x4.

## 2013-09-04 NOTE — ED Provider Notes (Signed)
Medical screening examination/treatment/procedure(s) were performed by non-physician practitioner and as supervising physician I was immediately available for consultation/collaboration.   Joya Gaskins, MD 09/04/13 1048

## 2013-10-12 ENCOUNTER — Emergency Department (HOSPITAL_COMMUNITY)
Admission: EM | Admit: 2013-10-12 | Discharge: 2013-10-12 | Disposition: A | Discharge status: Home / Self Care | Payer: Self-pay | Attending: Emergency Medicine | Admitting: Emergency Medicine

## 2013-10-12 ENCOUNTER — Emergency Department (HOSPITAL_COMMUNITY): Payer: Self-pay

## 2013-10-12 ENCOUNTER — Encounter (HOSPITAL_COMMUNITY): Payer: Self-pay | Admitting: Emergency Medicine

## 2013-10-12 DIAGNOSIS — R0789 Other chest pain: Secondary | ICD-10-CM

## 2013-10-12 DIAGNOSIS — R071 Chest pain on breathing: Secondary | ICD-10-CM | POA: Insufficient documentation

## 2013-10-12 DIAGNOSIS — R059 Cough, unspecified: Secondary | ICD-10-CM

## 2013-10-12 DIAGNOSIS — Z3202 Encounter for pregnancy test, result negative: Secondary | ICD-10-CM | POA: Insufficient documentation

## 2013-10-12 DIAGNOSIS — Z8719 Personal history of other diseases of the digestive system: Secondary | ICD-10-CM | POA: Insufficient documentation

## 2013-10-12 DIAGNOSIS — F17200 Nicotine dependence, unspecified, uncomplicated: Secondary | ICD-10-CM | POA: Insufficient documentation

## 2013-10-12 DIAGNOSIS — J45909 Unspecified asthma, uncomplicated: Secondary | ICD-10-CM | POA: Insufficient documentation

## 2013-10-12 DIAGNOSIS — R112 Nausea with vomiting, unspecified: Secondary | ICD-10-CM

## 2013-10-12 DIAGNOSIS — R05 Cough: Secondary | ICD-10-CM

## 2013-10-12 HISTORY — DX: Gastro-esophageal reflux disease without esophagitis: K21.9

## 2013-10-12 LAB — URINALYSIS, ROUTINE W REFLEX MICROSCOPIC
Bilirubin Urine: NEGATIVE
Hgb urine dipstick: NEGATIVE
Protein, ur: NEGATIVE mg/dL
Urobilinogen, UA: 0.2 mg/dL (ref 0.0–1.0)

## 2013-10-12 LAB — POCT PREGNANCY, URINE: Preg Test, Ur: NEGATIVE

## 2013-10-12 MED ORDER — ALBUTEROL SULFATE HFA 108 (90 BASE) MCG/ACT IN AERS
2.0000 | INHALATION_SPRAY | RESPIRATORY_TRACT | Status: DC | PRN
  Administered 2013-10-12: 2 via RESPIRATORY_TRACT
  Filled 2013-10-12: qty 6.7

## 2013-10-12 MED ORDER — IBUPROFEN 800 MG PO TABS
800.0000 mg | ORAL_TABLET | Freq: Once | ORAL | Status: AC
  Administered 2013-10-12: 800 mg via ORAL
  Filled 2013-10-12: qty 1

## 2013-10-12 NOTE — ED Provider Notes (Signed)
CSN: [id number]     Arrival date & time 10/12/13  4098 History   First MD Initiated Contact with Patient 10/12/13 [phone]     No chief complaint on file.  (Consider location/radiation/quality/duration/timing/severity/associated sxs/prior Treatment) HPI Pt presenting with c/o cough, anterior chest wall pain, worse with coughing.  Also c/o nausea/vomiting once daily for the past 3 days.  She has been able to keep down liquids.  No fever/chills.  Denies dysuria.  Has sick contacts of boyfriend and his children with similar symptoms.  No difficulty breathing.  Cough is nonproductive. No hx of DVT/PE, no hx recent travel, does not take hormones, no leg swelling, no hx of recent trauma or surgery.  She has not tried anything for her symptoms.  Was told to come to get a doctors note for missing work due to emesis.  There are no other associated systemic symptoms, there are no other alleviating or modifying factors.   Past Medical History  Diagnosis Date  . Asthma   . Seasonal allergies   . GERD (gastroesophageal reflux disease)    Past Surgical History  Procedure Laterality Date  . Tonsillectomy     No family history on file. History  Substance Use Topics  . Smoking status: Current Some Day Smoker -- 0.50 packs/day    Types: Cigarettes  . Smokeless tobacco: Not on file  . Alcohol Use: No   OB History   Grav Para Term Preterm Abortions TAB SAB Ect Mult Living   1              Review of Systems ROS reviewed and all otherwise negative except for mentioned in HPI  Allergies  Hydrocodone  Home Medications   No current outpatient prescriptions on file. BP 112/69  Pulse 54  Temp(Src) 98.1 F (36.7 C) (Oral)  Resp 18  SpO2 100%  LMP 10/05/2013 Vitals reviewed Physical Exam Physical Examination: General appearance - alert, well appearing, and in no distress Mental status - alert, oriented to person, place, and time Eyes - no scleral icterus, no conjunctival injection Mouth - mucous  membranes moist, pharynx normal without lesions Chest - clear to auscultation, no wheezes, rales or rhonchi, symmetric air entry, normal respiratory effort Heart - normal rate, regular rhythm, normal S1, S2, no murmurs, rubs, clicks or gallops Abdomen - soft, nontender, nondistended, no masses or organomegaly Extremities - peripheral pulses normal, no pedal edema, no clubbing or cyanosis Skin - normal coloration and turgor, no rashes  ED Course  Procedures (including critical care time) Labs Review Labs Reviewed  URINALYSIS, ROUTINE W REFLEX MICROSCOPIC  POCT PREGNANCY, URINE   Imaging Review Dg Chest 2 View  10/12/2013   CLINICAL DATA:  Cough, chest pain  EXAM: CHEST  2 VIEW  COMPARISON:  Prior chest x-ray 05/28/2012  FINDINGS: The lungs are clear and negative for focal airspace consolidation, pulmonary edema or suspicious pulmonary nodule. No pleural effusion or pneumothorax. Cardiac and mediastinal contours are within normal limits. No acute fracture or lytic or blastic osseous lesions. The visualized upper abdominal bowel gas pattern is unremarkable.  IMPRESSION: No active cardiopulmonary disease.   Electronically Signed   By: Malachy Moan M.D.   On: 10/12/2013 08:33    EKG Interpretation     Ventricular Rate:  53 PR Interval:  162 QRS Duration: 93 QT Interval:  405 QTC Calculation: 380 R Axis:   89 Text Interpretation:  Sinus rhythm Ventricular premature complex Baseline wander in lead(s) III aVF V1 V2 V3 V4 V6  No significant change since last tracing            MDM   1. Chest wall pain   2. Nausea and vomiting   3. Cough    Pt presenting with c/o chest pain with coughing.  CXR and EKG are reassuring.  Pt also c/o intermittent nausea and vomiting.  UA and upreg are reassuring.  No abdominal pain/tenderness.  Will give albuterol MDI for possible bronchitis.  Suspect viral syndrome.  Discharged with strict return precautions.  Pt agreeable with plan.    Ethelda Chick, MD 10/12/13 [phone]

## 2013-10-12 NOTE — ED Notes (Signed)
Pt reports this morning she woke up feeling sick, c/o back cramping, chest pain, nausea/vomiting, constipation X 3 days. Denies diarrhea. Reports CP worsens with deep breaths and coughing. Reports her boyfriends kids were with them over the weekend and they were sick and he got sick now she thinks she has what they have. Denies recent travels/surgeries. Reports non-productive cough X 1 week. sts she feels sob all the time, worsens when she is laying down flat. Pt in nad, skin warm and dry, resp e/u .

## 2013-10-12 NOTE — ED Notes (Signed)
Pt states shes had chest pain x3 days, and nausea vomiting x3 days. States she needs a doctors note because she left work early.

## 2013-10-15 ENCOUNTER — Emergency Department (HOSPITAL_COMMUNITY)
Admission: EM | Admit: 2013-10-15 | Discharge: 2013-10-15 | Disposition: A | Discharge status: Home / Self Care | Payer: No Typology Code available for payment source | Attending: Emergency Medicine | Admitting: Emergency Medicine

## 2013-10-15 ENCOUNTER — Encounter (HOSPITAL_COMMUNITY): Payer: Self-pay | Admitting: Emergency Medicine

## 2013-10-15 DIAGNOSIS — J45909 Unspecified asthma, uncomplicated: Secondary | ICD-10-CM | POA: Insufficient documentation

## 2013-10-15 DIAGNOSIS — R112 Nausea with vomiting, unspecified: Secondary | ICD-10-CM | POA: Insufficient documentation

## 2013-10-15 DIAGNOSIS — R197 Diarrhea, unspecified: Secondary | ICD-10-CM | POA: Insufficient documentation

## 2013-10-15 DIAGNOSIS — Z79899 Other long term (current) drug therapy: Secondary | ICD-10-CM | POA: Insufficient documentation

## 2013-10-15 DIAGNOSIS — Z3202 Encounter for pregnancy test, result negative: Secondary | ICD-10-CM | POA: Insufficient documentation

## 2013-10-15 DIAGNOSIS — R109 Unspecified abdominal pain: Secondary | ICD-10-CM

## 2013-10-15 DIAGNOSIS — F17200 Nicotine dependence, unspecified, uncomplicated: Secondary | ICD-10-CM | POA: Insufficient documentation

## 2013-10-15 DIAGNOSIS — K219 Gastro-esophageal reflux disease without esophagitis: Secondary | ICD-10-CM | POA: Insufficient documentation

## 2013-10-15 DIAGNOSIS — R1013 Epigastric pain: Secondary | ICD-10-CM | POA: Insufficient documentation

## 2013-10-15 LAB — COMPREHENSIVE METABOLIC PANEL
AST: 26 U/L (ref 0–37)
Albumin: 3.9 g/dL (ref 3.5–5.2)
BUN: 9 mg/dL (ref 6–23)
CO2: 25 mEq/L (ref 19–32)
Calcium: 9.4 mg/dL (ref 8.4–10.5)
Creatinine, Ser: 0.81 mg/dL (ref 0.50–1.10)
GFR calc non Af Amer: >90 mL/min (ref >90)

## 2013-10-15 LAB — CBC WITH DIFFERENTIAL/PLATELET
Basophils Absolute: 0.1 10*3/uL (ref 0.0–0.1)
Basophils Relative: 1 % (ref 0–1)
Eosinophils Relative: 6 % — ABNORMAL HIGH (ref 0–5)
HCT: 37.7 % (ref 36.0–46.0)
Lymphocytes Relative: 38 % (ref 12–46)
Lymphs Abs: 2.4 10*3/uL (ref 0.7–4.0)
MCHC: 34.2 g/dL (ref 30.0–36.0)
MCV: 90.2 fL (ref 78.0–100.0)
Monocytes Absolute: 0.5 10*3/uL (ref 0.1–1.0)
Monocytes Relative: 8 % (ref 3–12)
Neutro Abs: 3 10*3/uL (ref 1.7–7.7)
Neutrophils Relative %: 47 % (ref 43–77)
RBC: 4.18 MIL/uL (ref 3.87–5.11)
RDW: 13 % (ref 11.5–15.5)

## 2013-10-15 LAB — LIPASE, BLOOD: Lipase: 23 U/L (ref 11–59)

## 2013-10-15 LAB — URINALYSIS, ROUTINE W REFLEX MICROSCOPIC
Ketones, ur: NEGATIVE mg/dL
Leukocytes, UA: NEGATIVE
Nitrite: NEGATIVE
Protein, ur: NEGATIVE mg/dL
Specific Gravity, Urine: 1.027 (ref 1.005–1.030)
Urobilinogen, UA: 0.2 mg/dL (ref 0.0–1.0)
pH: 6.5 (ref 5.0–8.0)

## 2013-10-15 LAB — PREGNANCY, URINE: Preg Test, Ur: NEGATIVE

## 2013-10-15 MED ORDER — OMEPRAZOLE 20 MG PO CPDR: 20.0000 mg | DELAYED_RELEASE_CAPSULE | Freq: Every day | ORAL | Status: DC

## 2013-10-15 MED ORDER — SODIUM CHLORIDE 0.9 % IV SOLN
Freq: Once | INTRAVENOUS | Status: AC
  Administered 2013-10-15: 07:00:00 via INTRAVENOUS

## 2013-10-15 MED ORDER — ONDANSETRON HCL 4 MG/2ML IJ SOLN
4.0000 mg | Freq: Once | INTRAMUSCULAR | Status: AC
  Administered 2013-10-15: 4 mg via INTRAVENOUS
  Filled 2013-10-15: qty 2

## 2013-10-15 MED ORDER — GI COCKTAIL ~~LOC~~
30.0000 mL | Freq: Once | ORAL | Status: AC
  Administered 2013-10-15: 30 mL via ORAL
  Filled 2013-10-15: qty 30

## 2013-10-15 NOTE — ED Provider Notes (Signed)
CSN: [id number]     Arrival date & time 10/15/13  0620 History   First MD Initiated Contact with Patient 10/15/13 [phone]     Chief Complaint  Patient presents with  . Abdominal Pain  . Back Pain   (Consider location/radiation/quality/duration/timing/severity/associated sxs/prior Treatment) Patient is a 22 y.o. female presenting with abdominal pain. The history is provided by the patient. No language interpreter was used.  Abdominal Pain Pain location:  Epigastric Pain quality: aching and cramping   Associated symptoms: chest pain, diarrhea, nausea and vomiting   Associated symptoms: no dysuria, no fever, no shortness of breath, no vaginal bleeding and no vaginal discharge   Associated symptoms comment:  She returns to ED with persistent chest discomfort in left chest, worse with movement, and epigastric abdominal pain associated with nausea and vomiting. No fever. She denies hematemesis.    Past Medical History  Diagnosis Date  . Asthma   . Seasonal allergies   . GERD (gastroesophageal reflux disease)    Past Surgical History  Procedure Laterality Date  . Tonsillectomy     History reviewed. No pertinent family history. History  Substance Use Topics  . Smoking status: Current Some Day Smoker -- 0.50 packs/day    Types: Cigarettes  . Smokeless tobacco: Not on file  . Alcohol Use: No   OB History   Grav Para Term Preterm Abortions TAB SAB Ect Mult Living   1              Review of Systems  Constitutional: Negative for fever.  Respiratory: Negative for shortness of breath.   Cardiovascular: Positive for chest pain.  Gastrointestinal: Positive for nausea, vomiting, abdominal pain and diarrhea.  Genitourinary: Negative for dysuria, vaginal bleeding, vaginal discharge and pelvic pain.  Musculoskeletal: Negative for myalgias.    Allergies  Hydrocodone  Home Medications   Current Outpatient Rx  Name  Route  Sig  Dispense  Refill  . albuterol (PROVENTIL HFA;VENTOLIN HFA)  108 (90 BASE) MCG/ACT inhaler   Inhalation   Inhale 1 puff into the lungs every 6 (six) hours as needed for wheezing or shortness of breath.          BP 140/82  Pulse 80  Temp(Src) 98.6 F (37 C) (Oral)  Resp 20  Ht 5\' 7"  (1.702 m)  Wt 220 lb (99.791 kg)  BMI 34.45 kg/m2  SpO2 100%  LMP 10/05/2013 Physical Exam  Constitutional: She is oriented to person, place, and time. She appears well-developed and well-nourished.  HENT:  Head: Normocephalic.  Neck: Normal range of motion. Neck supple.  Cardiovascular: Normal rate and regular rhythm.   Pulmonary/Chest: Effort normal and breath sounds normal. She has no wheezes. She has no rales. She exhibits tenderness.  Abdominal: Soft. Bowel sounds are normal. There is tenderness. There is no rebound and no guarding.  Upper abdominal tenderness that is mild. Soft abdomen with normoactive bowel sounds. No mass.   Musculoskeletal: Normal range of motion.  Neurological: She is alert and oriented to person, place, and time.  Skin: Skin is warm and dry. No rash noted.  Psychiatric: She has a normal mood and affect.    ED Course  Procedures (including critical care time) Labs Review Labs Reviewed  CBC WITH DIFFERENTIAL - Abnormal; Notable for the following:    Eosinophils Relative 6 (*)    All other components within normal limits  COMPREHENSIVE METABOLIC PANEL - Abnormal; Notable for the following:    Glucose, Bld 102 (*)    Total  Bilirubin 0.2 (*)    All other components within normal limits  URINALYSIS, ROUTINE W REFLEX MICROSCOPIC - Abnormal; Notable for the following:    APPearance CLOUDY (*)    All other components within normal limits  LIPASE, BLOOD  PREGNANCY, URINE   Imaging Review No results found.  EKG Interpretation   None       MDM  No diagnosis found. 1. Abdominal pain  Records reviewed from previous visits. She had similar episode one month ago, and recent evaluation for chest pain that is persistent today.  Feel symptoms most likely associated with GERD, ulcers not excluded. Will treat with PPI, Zofran prn, and GI referral.  Stable for discharge. All lab studies and diagnoses discussed with the patient and all questions answered.     Arnoldo Hooker, PA-C 10/15/13 [phone]

## 2013-10-15 NOTE — ED Notes (Signed)
Pt complains of abd pain, vomiting, diarrhea, low back pain and chest wall pain since Saturday, worse this am

## 2013-10-15 NOTE — ED Provider Notes (Signed)
Medical screening examination/treatment/procedure(s) were performed by non-physician practitioner and as supervising physician I was immediately available for consultation/collaboration.    Lamona Eimer M Andelyn Spade, MD 10/15/13 2036 

## 2013-10-26 ENCOUNTER — Emergency Department (HOSPITAL_COMMUNITY): Payer: Self-pay

## 2013-10-26 ENCOUNTER — Emergency Department (HOSPITAL_COMMUNITY)
Admission: EM | Admit: 2013-10-26 | Discharge: 2013-10-26 | Disposition: A | Discharge status: Home / Self Care | Payer: Self-pay | Attending: Emergency Medicine | Admitting: Emergency Medicine

## 2013-10-26 ENCOUNTER — Encounter (HOSPITAL_COMMUNITY): Payer: Self-pay | Admitting: Emergency Medicine

## 2013-10-26 DIAGNOSIS — Z3202 Encounter for pregnancy test, result negative: Secondary | ICD-10-CM | POA: Insufficient documentation

## 2013-10-26 DIAGNOSIS — R109 Unspecified abdominal pain: Secondary | ICD-10-CM | POA: Insufficient documentation

## 2013-10-26 DIAGNOSIS — R1013 Epigastric pain: Secondary | ICD-10-CM | POA: Insufficient documentation

## 2013-10-26 DIAGNOSIS — Z79899 Other long term (current) drug therapy: Secondary | ICD-10-CM | POA: Insufficient documentation

## 2013-10-26 DIAGNOSIS — K219 Gastro-esophageal reflux disease without esophagitis: Secondary | ICD-10-CM | POA: Insufficient documentation

## 2013-10-26 DIAGNOSIS — J45909 Unspecified asthma, uncomplicated: Secondary | ICD-10-CM | POA: Insufficient documentation

## 2013-10-26 DIAGNOSIS — R112 Nausea with vomiting, unspecified: Secondary | ICD-10-CM | POA: Insufficient documentation

## 2013-10-26 DIAGNOSIS — F17200 Nicotine dependence, unspecified, uncomplicated: Secondary | ICD-10-CM | POA: Insufficient documentation

## 2013-10-26 DIAGNOSIS — G8929 Other chronic pain: Secondary | ICD-10-CM | POA: Insufficient documentation

## 2013-10-26 HISTORY — DX: Other chronic pain: G89.29

## 2013-10-26 HISTORY — DX: Nausea with vomiting, unspecified: R11.2

## 2013-10-26 HISTORY — DX: Unspecified abdominal pain: R10.9

## 2013-10-26 HISTORY — DX: Dorsalgia, unspecified: M54.9

## 2013-10-26 LAB — COMPREHENSIVE METABOLIC PANEL
ALT: 24 U/L (ref 0–35)
AST: 20 U/L (ref 0–37)
Alkaline Phosphatase: 84 U/L (ref 39–117)
BUN: 13 mg/dL (ref 6–23)
CO2: 23 mEq/L (ref 19–32)
Calcium: 9.6 mg/dL (ref 8.4–10.5)
Creatinine, Ser: 0.88 mg/dL (ref 0.50–1.10)
GFR calc Af Amer: >90 mL/min (ref >90)
GFR calc non Af Amer: >90 mL/min (ref >90)
Glucose, Bld: 99 mg/dL (ref 70–99)
Potassium: 4.1 mEq/L (ref 3.5–5.1)
Sodium: 136 mEq/L (ref 135–145)
Total Bilirubin: 0.2 mg/dL — ABNORMAL LOW (ref 0.3–1.2)
Total Protein: 7.5 g/dL (ref 6.0–8.3)

## 2013-10-26 LAB — CBC WITH DIFFERENTIAL/PLATELET
Basophils Absolute: 0.1 10*3/uL (ref 0.0–0.1)
Eosinophils Absolute: 0.3 10*3/uL (ref 0.0–0.7)
Eosinophils Relative: 5 % (ref 0–5)
HCT: 39.2 % (ref 36.0–46.0)
Lymphocytes Relative: 37 % (ref 12–46)
MCH: 31.2 pg (ref 26.0–34.0)
MCV: 89.9 fL (ref 78.0–100.0)
Monocytes Absolute: 0.3 10*3/uL (ref 0.1–1.0)
Monocytes Relative: 6 % (ref 3–12)
Neutro Abs: 2.8 10*3/uL (ref 1.7–7.7)
Platelets: 316 10*3/uL (ref 150–400)
RDW: 12.9 % (ref 11.5–15.5)

## 2013-10-26 LAB — PREGNANCY, URINE: Preg Test, Ur: NEGATIVE

## 2013-10-26 LAB — URINALYSIS, ROUTINE W REFLEX MICROSCOPIC
Bilirubin Urine: NEGATIVE
Hgb urine dipstick: NEGATIVE
Ketones, ur: NEGATIVE mg/dL
Protein, ur: NEGATIVE mg/dL
Urobilinogen, UA: 0.2 mg/dL (ref 0.0–1.0)

## 2013-10-26 MED ORDER — ONDANSETRON HCL 4 MG PO TABS: 4.0000 mg | ORAL_TABLET | Freq: Three times a day (TID) | ORAL | Status: DC | PRN

## 2013-10-26 MED ORDER — ONDANSETRON 8 MG PO TBDP
8.0000 mg | ORAL_TABLET | Freq: Once | ORAL | Status: AC
  Administered 2013-10-26: 8 mg via ORAL
  Filled 2013-10-26: qty 1

## 2013-10-26 MED ORDER — DICYCLOMINE HCL 10 MG/ML IM SOLN
20.0000 mg | Freq: Once | INTRAMUSCULAR | Status: AC
  Administered 2013-10-26: 20 mg via INTRAMUSCULAR
  Filled 2013-10-26: qty 2

## 2013-10-26 MED ORDER — FAMOTIDINE 20 MG PO TABS
40.0000 mg | ORAL_TABLET | Freq: Once | ORAL | Status: AC
  Administered 2013-10-26: 40 mg via ORAL
  Filled 2013-10-26: qty 2

## 2013-10-26 NOTE — ED Notes (Signed)
Patient transported to X-ray 

## 2013-10-26 NOTE — ED Notes (Signed)
Patient reports that she has been having N/V and abdominal pain. Patient also c/o of diarrhea and constipation. Patient reports that she has been to the ED before the same symptoms. Patient reports that the pain is worse and also having more N/V.

## 2013-10-26 NOTE — ED Provider Notes (Signed)
CSN: [id number]     Arrival date & time 10/26/13  3086 History   First MD Initiated Contact with Patient 10/26/13 [phone]     Chief Complaint  Patient presents with  . Abdominal Pain  . Emesis      HPI Pt was seen at 0720.  Per pt, c/o gradual onset and persistence of constant upper abd "pain" for the past 3 to 4 days.  Has been associated with several intermittent episodes of N/V.  Describes the abd pain as "cramping." States she has been taking OTC motrin and tylenol for pain. States she "alternates" having diarrhea and constipation "for a while." Denies fevers, no back pain, no rash, no CP/SOB, no black or blood in stools or emesis.  The symptoms have been associated with no other complaints. The patient has a significant history of similar symptoms previously, recently being evaluated for this complaint and multiple prior evals for same. States she has not been taking her GERD medicines and not f/u with GI MD as previously instructed.    Past Medical History  Diagnosis Date  . Asthma   . Seasonal allergies   . GERD (gastroesophageal reflux disease)   . Chronic back pain   . Nausea and vomiting     recurrent  . Recurrent abdominal pain    Past Surgical History  Procedure Laterality Date  . Tonsillectomy      History  Substance Use Topics  . Smoking status: Current Some Day Smoker -- 0.50 packs/day    Types: Cigarettes  . Smokeless tobacco: Not on file  . Alcohol Use: No   OB History   Grav Para Term Preterm Abortions TAB SAB Ect Mult Living   1              Review of Systems ROS: Statement: All systems negative except as marked or noted in the HPI; Constitutional: Negative for fever and chills. ; ; Eyes: Negative for eye pain, redness and discharge. ; ; ENMT: Negative for ear pain, hoarseness, nasal congestion, sinus pressure and sore throat. ; ; Cardiovascular: Negative for chest pain, palpitations, diaphoresis, dyspnea and peripheral edema. ; ; Respiratory: Negative for  cough, wheezing and stridor. ; ; Gastrointestinal: +N/V, abd pain, diarrhea/constipation. Negative for blood in stool, hematemesis, jaundice and rectal bleeding. . ; ; Genitourinary: Negative for dysuria, flank pain and hematuria. ; ; Musculoskeletal: Negative for back pain and neck pain. Negative for swelling and trauma.; ; Skin: Negative for pruritus, rash, abrasions, blisters, bruising and skin lesion.; ; Neuro: Negative for headache, lightheadedness and neck stiffness. Negative for weakness, altered level of consciousness , altered mental status, extremity weakness, paresthesias, involuntary movement, seizure and syncope.     Allergies  Hydrocodone  Home Medications   Current Outpatient Rx  Name  Route  Sig  Dispense  Refill  . albuterol (PROVENTIL HFA;VENTOLIN HFA) 108 (90 BASE) MCG/ACT inhaler   Inhalation   Inhale 1 puff into the lungs every 6 (six) hours as needed for wheezing or shortness of breath.         Marland Kitchen omeprazole (PRILOSEC) 20 MG capsule   Oral   Take 1 capsule (20 mg total) by mouth daily. Take one tablet bid for the first 3 days, then once daily after   30 capsule   0    BP 124/64  Pulse 57  Temp(Src) 98.2 F (36.8 C) (Oral)  Resp 20  Ht 5\' 7"  (1.702 m)  Wt 220 lb (99.791 kg)  BMI 34.45 kg/m2  SpO2 99%  LMP 10/06/2013 Physical Exam 0730: Physical examination:  Nursing notes reviewed; Vital signs and O2 SAT reviewed;  Constitutional: Well developed, Well nourished, Well hydrated, In no acute distress; Head:  Normocephalic, atraumatic; Eyes: EOMI, PERRL, No scleral icterus; ENMT: Mouth and pharynx normal, Mucous membranes moist; Neck: Supple, Full range of motion, No lymphadenopathy; Cardiovascular: Regular rate and rhythm, No gallop; Respiratory: Breath sounds clear & equal bilaterally, No wheezes.  Speaking full sentences with ease, Normal respiratory effort/excursion; Chest: Nontender, Movement normal; Abdomen: Soft, +mid-epigastric area tenderness to palp. No  rebound or guarding. Nondistended, Normal bowel sounds; Genitourinary: No CVA tenderness; Extremities: Pulses normal, No tenderness, No edema, No calf edema or asymmetry.; Neuro: AA&Ox3, Major CN grossly intact.  Speech clear. Climbs on and off stretcher easily by herself. Gait steady. No gross focal motor or sensory deficits in extremities.; Skin: Color normal, Warm, Dry.   ED Course  Procedures   EKG Interpretation   None       MDM  MDM Reviewed: previous chart, nursing note and vitals Reviewed previous: labs Interpretation: labs, ultrasound and x-ray   Results for orders placed during the hospital encounter of 10/26/13  PREGNANCY, URINE      Result Value Range   Preg Test, Ur NEGATIVE  NEGATIVE  URINALYSIS, ROUTINE W REFLEX MICROSCOPIC      Result Value Range   Color, Urine YELLOW  YELLOW   APPearance CLOUDY (*) CLEAR   Specific Gravity, Urine 1.027  1.005 - 1.030   pH 6.0  5.0 - 8.0   Glucose, UA NEGATIVE  NEGATIVE mg/dL   Hgb urine dipstick NEGATIVE  NEGATIVE   Bilirubin Urine NEGATIVE  NEGATIVE   Ketones, ur NEGATIVE  NEGATIVE mg/dL   Protein, ur NEGATIVE  NEGATIVE mg/dL   Urobilinogen, UA 0.2  0.0 - 1.0 mg/dL   Nitrite NEGATIVE  NEGATIVE   Leukocytes, UA NEGATIVE  NEGATIVE  CBC WITH DIFFERENTIAL      Result Value Range   WBC 5.6  4.0 - 10.5 K/uL   RBC 4.36  3.87 - 5.11 MIL/uL   Hemoglobin 13.6  12.0 - 15.0 g/dL   HCT 47.8  29.5 - 62.1 %   MCV 89.9  78.0 - 100.0 fL   MCH 31.2  26.0 - 34.0 pg   MCHC 34.7  30.0 - 36.0 g/dL   RDW 30.8  65.7 - 84.6 %   Platelets 316  150 - 400 K/uL   Neutrophils Relative % 51  43 - 77 %   Neutro Abs 2.8  1.7 - 7.7 K/uL   Lymphocytes Relative 37  12 - 46 %   Lymphs Abs 2.1  0.7 - 4.0 K/uL   Monocytes Relative 6  3 - 12 %   Monocytes Absolute 0.3  0.1 - 1.0 K/uL   Eosinophils Relative 5  0 - 5 %   Eosinophils Absolute 0.3  0.0 - 0.7 K/uL   Basophils Relative 1  0 - 1 %   Basophils Absolute 0.1  0.0 - 0.1 K/uL  COMPREHENSIVE  METABOLIC PANEL      Result Value Range   Sodium 136  135 - 145 mEq/L   Potassium 4.1  3.5 - 5.1 mEq/L   Chloride 104  96 - 112 mEq/L   CO2 23  19 - 32 mEq/L   Glucose, Bld 99  70 - 99 mg/dL   BUN 13  6 - 23 mg/dL   Creatinine, Ser 9.62  0.50 - 1.10 mg/dL  Calcium 9.6  8.4 - 10.5 mg/dL   Total Protein 7.5  6.0 - 8.3 g/dL   Albumin 4.2  3.5 - 5.2 g/dL   AST 20  0 - 37 U/L   ALT 24  0 - 35 U/L   Alkaline Phosphatase 84  39 - 117 U/L   Total Bilirubin 0.2 (*) 0.3 - 1.2 mg/dL   GFR calc non Af Amer >90  >90 mL/min   GFR calc Af Amer >90  >90 mL/min  LIPASE, BLOOD      Result Value Range   Lipase 39  11 - 59 U/L    US Abdomen Complete 10/26/2013   CLINICAL DATA:  Abdominal pain  EXAM: ULTRASOUND ABDOMEN COMPLETE  COMPARISON:  None.  FINDINGS: Gallbladder  No gallstones or wall thickening visualized. No sonographic Murphy sign noted.  Common bile duct  Diameter: 4.2 mm.  Liver  No focal lesion identified. There is diffuse increased echogenicity of the liver suspicious for fatty infiltration.  IVC  No abnormality visualized.  Pancreas  Visualized portion unremarkable.  Spleen  Size and appearance within normal limits. Measures 6.6 cm in length.  Right Kidney  Length: 12.8 cm. Echogenicity within normal limits. No mass or hydronephrosis visualized.  Left Kidney  Length: 12 cm. Echogenicity within normal limits. No mass or hydronephrosis visualized.  Abdominal aorta  No aneurysm visualized.  Measures up to 1.9 cm in diameter.  IMPRESSION: 1. No gallstones are noted within gallbladder.  Normal CBD. 2. Probable fatty infiltration of the liver. 3. No hydronephrosis or diagnostic renal calculus.   Electronically Signed   By: Natasha Mead M.D.   On: 10/26/2013 09:19   Dg Abd Acute W/chest 10/26/2013   CLINICAL DATA:  Abdominal pain, nausea, vomiting, diarrhea  EXAM: ACUTE ABDOMEN SERIES (ABDOMEN 2 VIEW & CHEST 1 VIEW)  COMPARISON:  08/28/2009, 10/12/2013  FINDINGS: Normal heart size and vascularity.  Lungs clear. No acute airspace process, collapse, consolidation, effusion or pneumothorax. Trachea midline.  Negative for free air.  Nonspecific gaseous distention of the colon in the lower abdomen and pelvis. Stool noted in the rectum. Negative for significant obstruction or ileus. No abnormal calcification or osseous abnormality.  IMPRESSION: No acute finding in the chest or abdomen.  Negative for significant obstruction, ileus, or free air   Electronically Signed   By: Ruel Favors M.D.   On: 10/26/2013 08:36    0940:  Pt has tol PO well while in the ED without N/V. Abd benign, VSS. Feels better and wants to go home now. Dx and testing d/w pt.  Questions answered.  Verb understanding, agreeable to d/c home with outpt f/u.       Laray Anger, DO 10/28/13 2125

## 2013-11-16 ENCOUNTER — Encounter (HOSPITAL_COMMUNITY): Payer: Self-pay | Admitting: Emergency Medicine

## 2013-11-16 ENCOUNTER — Emergency Department (HOSPITAL_COMMUNITY)
Admission: EM | Admit: 2013-11-16 | Discharge: 2013-11-16 | Disposition: A | Discharge status: Home / Self Care | Payer: No Typology Code available for payment source | Attending: Emergency Medicine | Admitting: Emergency Medicine

## 2013-11-16 DIAGNOSIS — G8929 Other chronic pain: Secondary | ICD-10-CM | POA: Insufficient documentation

## 2013-11-16 DIAGNOSIS — K219 Gastro-esophageal reflux disease without esophagitis: Secondary | ICD-10-CM | POA: Insufficient documentation

## 2013-11-16 DIAGNOSIS — F17200 Nicotine dependence, unspecified, uncomplicated: Secondary | ICD-10-CM | POA: Insufficient documentation

## 2013-11-16 DIAGNOSIS — Z3202 Encounter for pregnancy test, result negative: Secondary | ICD-10-CM | POA: Insufficient documentation

## 2013-11-16 DIAGNOSIS — M545 Low back pain, unspecified: Secondary | ICD-10-CM | POA: Insufficient documentation

## 2013-11-16 DIAGNOSIS — M549 Dorsalgia, unspecified: Secondary | ICD-10-CM

## 2013-11-16 DIAGNOSIS — J45909 Unspecified asthma, uncomplicated: Secondary | ICD-10-CM | POA: Insufficient documentation

## 2013-11-16 DIAGNOSIS — R109 Unspecified abdominal pain: Secondary | ICD-10-CM | POA: Insufficient documentation

## 2013-11-16 DIAGNOSIS — Z79899 Other long term (current) drug therapy: Secondary | ICD-10-CM | POA: Insufficient documentation

## 2013-11-16 LAB — PREGNANCY, URINE: Preg Test, Ur: NEGATIVE

## 2013-11-16 LAB — URINALYSIS, ROUTINE W REFLEX MICROSCOPIC
Bilirubin Urine: NEGATIVE
Glucose, UA: NEGATIVE mg/dL
Hgb urine dipstick: NEGATIVE
Ketones, ur: NEGATIVE mg/dL
Nitrite: NEGATIVE
Specific Gravity, Urine: 1.022 (ref 1.005–1.030)
pH: 6 (ref 5.0–8.0)

## 2013-11-16 MED ORDER — CYCLOBENZAPRINE HCL 10 MG PO TABS: 10.0000 mg | ORAL_TABLET | Freq: Two times a day (BID) | ORAL | Status: DC | PRN

## 2013-11-16 MED ORDER — IBUPROFEN 800 MG PO TABS
800.0000 mg | ORAL_TABLET | Freq: Once | ORAL | Status: AC
  Administered 2013-11-16: 800 mg via ORAL
  Filled 2013-11-16: qty 1

## 2013-11-16 MED ORDER — IBUPROFEN 800 MG PO TABS: 800.0000 mg | ORAL_TABLET | Freq: Three times a day (TID) | ORAL | Status: DC

## 2013-11-16 MED ORDER — CYCLOBENZAPRINE HCL 10 MG PO TABS
10.0000 mg | ORAL_TABLET | Freq: Once | ORAL | Status: AC
  Administered 2013-11-16: 10 mg via ORAL
  Filled 2013-11-16: qty 1

## 2013-11-16 NOTE — ED Provider Notes (Signed)
CSN: [id number]     Arrival date & time 11/16/13  0615 History   First MD Initiated Contact with Patient 11/16/13 0636     Chief Complaint  Patient presents with  . Back Pain   (Consider location/radiation/quality/duration/timing/severity/associated sxs/prior Treatment) Patient is a 22 y.o. female presenting with back pain. The history is provided by the patient. No language interpreter was used.  Back Pain Location:  Lumbar spine Quality:  Aching Radiates to:  Does not radiate Associated symptoms: abdominal pain   Associated symptoms: no fever, no headaches and no numbness   Associated symptoms comment:  Lower left back pain, worse with movement, better with rest. Symptoms x 2-3 days. She is not aware of any injury and has no history of recurrent or chronic back pain. No fever, dysuria. She reports abdominal pain but has a longstanding history of abdominal pain, nausea and diarrhea that is unchanged.    Past Medical History  Diagnosis Date  . Asthma   . Seasonal allergies   . GERD (gastroesophageal reflux disease)   . Chronic back pain   . Nausea and vomiting     recurrent  . Recurrent abdominal pain    Past Surgical History  Procedure Laterality Date  . Tonsillectomy     History reviewed. No pertinent family history. History  Substance Use Topics  . Smoking status: Current Some Day Smoker -- 0.50 packs/day    Types: Cigarettes  . Smokeless tobacco: Not on file  . Alcohol Use: No   OB History   Grav Para Term Preterm Abortions TAB SAB Ect Mult Living   1              Review of Systems  Constitutional: Negative for fever and chills.  Gastrointestinal: Positive for abdominal pain. Negative for nausea.       See HPI.  Musculoskeletal: Positive for back pain.  Skin: Negative.   Neurological: Negative.  Negative for numbness and headaches.    Allergies  Hydrocodone  Home Medications   Current Outpatient Rx  Name  Route  Sig  Dispense  Refill  . acetaminophen  (TYLENOL) 500 MG tablet   Oral   Take 1,000 mg by mouth every 6 (six) hours as needed for moderate pain or headache.         . calcium carbonate (TUMS - DOSED IN MG ELEMENTAL CALCIUM) 500 MG chewable tablet   Oral   Chew 1 tablet by mouth 3 (three) times daily as needed for indigestion or heartburn.         Marland Kitchen ibuprofen (ADVIL,MOTRIN) 200 MG tablet   Oral   Take 600 mg by mouth every 6 (six) hours as needed for mild pain.          Marland Kitchen albuterol (PROVENTIL HFA;VENTOLIN HFA) 108 (90 BASE) MCG/ACT inhaler   Inhalation   Inhale 2 puffs into the lungs every 6 (six) hours as needed for wheezing or shortness of breath.           LMP 09/28/2013 Physical Exam  Constitutional: She is oriented to person, place, and time. She appears well-developed and well-nourished.  HENT:  Head: Normocephalic.  Neck: Normal range of motion. Neck supple.  Pulmonary/Chest: Effort normal.  Abdominal: Soft. Bowel sounds are normal. She exhibits no distension. There is no rebound and no guarding.  Musculoskeletal: Normal range of motion.  Mild lumbar and paralumbar tenderness without swelling or discoloration.  Neurological: She is alert and oriented to person, place, and time. Coordination normal.  Skin: Skin is warm and dry. No rash noted.  Psychiatric: She has a normal mood and affect.    ED Course  Procedures (including critical care time) Labs Review Labs Reviewed  URINALYSIS, ROUTINE W REFLEX MICROSCOPIC  PREGNANCY, URINE   Results for orders placed during the hospital encounter of 11/16/13  URINALYSIS, ROUTINE W REFLEX MICROSCOPIC      Result Value Range   Color, Urine YELLOW  YELLOW   APPearance CLEAR  CLEAR   Specific Gravity, Urine 1.022  1.005 - 1.030   pH 6.0  5.0 - 8.0   Glucose, UA NEGATIVE  NEGATIVE mg/dL   Hgb urine dipstick NEGATIVE  NEGATIVE   Bilirubin Urine NEGATIVE  NEGATIVE   Ketones, ur NEGATIVE  NEGATIVE mg/dL   Protein, ur NEGATIVE  NEGATIVE mg/dL   Urobilinogen, UA  0.2  0.0 - 1.0 mg/dL   Nitrite NEGATIVE  NEGATIVE   Leukocytes, UA NEGATIVE  NEGATIVE  PREGNANCY, URINE      Result Value Range   Preg Test, Ur NEGATIVE  NEGATIVE    Imaging Review No results found.  EKG Interpretation   None       MDM  No diagnosis found. 1.  Muscular back pain  Pain is reproducible with movement and palpation suggesting muscular back pain. Neg urinalysis and pregnancy. VSS. She can go home and is encouraged to follow up with primary care  Arnoldo Hooker, PA-C 11/16/13 0981

## 2013-11-16 NOTE — ED Notes (Signed)
Pt c/o N/V/D and abdominal pain x 1 month however started having lower left back pain on Saturday.  Nothing OTC helps w/ symptoms.

## 2013-11-16 NOTE — ED Notes (Signed)
Pt requesting crackers to eat 

## 2013-11-16 NOTE — ED Notes (Signed)
Pt arrived to the ED with a complaint of back pain.  Pt was seen in mid November and told that she needed to see a gastroenterologist but has been unable to pay for the visit so she has not followed up.  Pt has lower back pain, medially.  Pt states she has abdominal pain but that the back pain is a new symptom. Pt also is complaining of dizziness.

## 2013-11-17 NOTE — ED Provider Notes (Signed)
Medical screening examination/treatment/procedure(s) were performed by non-physician practitioner and as supervising physician I was immediately available for consultation/collaboration.  Toy Baker, MD 11/17/13 [phone]

## 2013-11-30 ENCOUNTER — Encounter (HOSPITAL_COMMUNITY): Payer: Self-pay | Admitting: Emergency Medicine

## 2013-11-30 ENCOUNTER — Emergency Department (HOSPITAL_COMMUNITY)
Admission: EM | Admit: 2013-11-30 | Discharge: 2013-12-01 | Discharge status: Against Medical Advice | Payer: No Typology Code available for payment source | Attending: Emergency Medicine | Admitting: Emergency Medicine

## 2013-11-30 DIAGNOSIS — R079 Chest pain, unspecified: Secondary | ICD-10-CM | POA: Insufficient documentation

## 2013-11-30 DIAGNOSIS — M25519 Pain in unspecified shoulder: Secondary | ICD-10-CM | POA: Insufficient documentation

## 2013-11-30 DIAGNOSIS — J45909 Unspecified asthma, uncomplicated: Secondary | ICD-10-CM | POA: Insufficient documentation

## 2013-11-30 DIAGNOSIS — M79609 Pain in unspecified limb: Secondary | ICD-10-CM | POA: Insufficient documentation

## 2013-11-30 DIAGNOSIS — M549 Dorsalgia, unspecified: Secondary | ICD-10-CM | POA: Insufficient documentation

## 2013-11-30 DIAGNOSIS — K219 Gastro-esophageal reflux disease without esophagitis: Secondary | ICD-10-CM | POA: Insufficient documentation

## 2013-11-30 DIAGNOSIS — J309 Allergic rhinitis, unspecified: Secondary | ICD-10-CM | POA: Insufficient documentation

## 2013-11-30 DIAGNOSIS — F17200 Nicotine dependence, unspecified, uncomplicated: Secondary | ICD-10-CM | POA: Insufficient documentation

## 2013-11-30 DIAGNOSIS — R109 Unspecified abdominal pain: Secondary | ICD-10-CM | POA: Insufficient documentation

## 2013-11-30 DIAGNOSIS — Z79899 Other long term (current) drug therapy: Secondary | ICD-10-CM | POA: Insufficient documentation

## 2013-11-30 DIAGNOSIS — G8929 Other chronic pain: Secondary | ICD-10-CM | POA: Insufficient documentation

## 2013-11-30 LAB — CBC
HCT: 38.2 % (ref 36.0–46.0)
Hemoglobin: 13.1 g/dL (ref 12.0–15.0)
MCH: 31 pg (ref 26.0–34.0)
MCV: 90.5 fL (ref 78.0–100.0)
Platelets: 342 10*3/uL (ref 150–400)
RBC: 4.22 MIL/uL (ref 3.87–5.11)
WBC: 7.5 10*3/uL (ref 4.0–10.5)

## 2013-11-30 LAB — POCT I-STAT TROPONIN I: Troponin i, poc: 0.02 ng/mL (ref 0.00–0.08)

## 2013-11-30 LAB — BASIC METABOLIC PANEL
CO2: 26 mEq/L (ref 19–32)
Chloride: 104 mEq/L (ref 96–112)
Creatinine, Ser: 0.86 mg/dL (ref 0.50–1.10)
Glucose, Bld: 96 mg/dL (ref 70–99)

## 2013-11-30 NOTE — ED Notes (Addendum)
Pt c/o CP radiating to L arm x 1 1/2 months.  Pt seen at [facility] several times with c/o abdominal pain and back pain.  Asked pt if she mentioned the ongoing CP at those visits.  Pt states she was told it was related to GERD, however pt is not taking meds as prescribed.

## 2013-12-01 ENCOUNTER — Encounter (HOSPITAL_COMMUNITY): Payer: Self-pay | Admitting: Emergency Medicine

## 2013-12-01 ENCOUNTER — Emergency Department (HOSPITAL_COMMUNITY): Payer: No Typology Code available for payment source

## 2013-12-01 ENCOUNTER — Emergency Department (HOSPITAL_COMMUNITY)
Admission: EM | Admit: 2013-12-01 | Discharge: 2013-12-01 | Disposition: A | Discharge status: Home / Self Care | Payer: No Typology Code available for payment source | Attending: Emergency Medicine | Admitting: Emergency Medicine

## 2013-12-01 DIAGNOSIS — G8929 Other chronic pain: Secondary | ICD-10-CM | POA: Insufficient documentation

## 2013-12-01 DIAGNOSIS — Z8719 Personal history of other diseases of the digestive system: Secondary | ICD-10-CM | POA: Insufficient documentation

## 2013-12-01 DIAGNOSIS — M79609 Pain in unspecified limb: Secondary | ICD-10-CM | POA: Insufficient documentation

## 2013-12-01 DIAGNOSIS — M25512 Pain in left shoulder: Secondary | ICD-10-CM

## 2013-12-01 DIAGNOSIS — M549 Dorsalgia, unspecified: Secondary | ICD-10-CM | POA: Insufficient documentation

## 2013-12-01 DIAGNOSIS — Z885 Allergy status to narcotic agent status: Secondary | ICD-10-CM | POA: Insufficient documentation

## 2013-12-01 DIAGNOSIS — Z79899 Other long term (current) drug therapy: Secondary | ICD-10-CM | POA: Insufficient documentation

## 2013-12-01 DIAGNOSIS — I498 Other specified cardiac arrhythmias: Secondary | ICD-10-CM | POA: Insufficient documentation

## 2013-12-01 DIAGNOSIS — M542 Cervicalgia: Secondary | ICD-10-CM | POA: Insufficient documentation

## 2013-12-01 DIAGNOSIS — E669 Obesity, unspecified: Secondary | ICD-10-CM | POA: Insufficient documentation

## 2013-12-01 DIAGNOSIS — K219 Gastro-esophageal reflux disease without esophagitis: Secondary | ICD-10-CM | POA: Insufficient documentation

## 2013-12-01 DIAGNOSIS — F17200 Nicotine dependence, unspecified, uncomplicated: Secondary | ICD-10-CM | POA: Insufficient documentation

## 2013-12-01 DIAGNOSIS — M25519 Pain in unspecified shoulder: Secondary | ICD-10-CM | POA: Insufficient documentation

## 2013-12-01 DIAGNOSIS — J45909 Unspecified asthma, uncomplicated: Secondary | ICD-10-CM | POA: Insufficient documentation

## 2013-12-01 MED ORDER — IBUPROFEN 600 MG PO TABS: 600.0000 mg | ORAL_TABLET | Freq: Four times a day (QID) | ORAL | Status: DC | PRN

## 2013-12-01 MED ORDER — CYCLOBENZAPRINE HCL 10 MG PO TABS: 10.0000 mg | ORAL_TABLET | Freq: Three times a day (TID) | ORAL | Status: DC | PRN

## 2013-12-01 MED ORDER — DIAZEPAM 5 MG PO TABS
5.0000 mg | ORAL_TABLET | Freq: Once | ORAL | Status: AC
  Administered 2013-12-01: 5 mg via ORAL
  Filled 2013-12-01: qty 1

## 2013-12-01 MED ORDER — OXYCODONE-ACETAMINOPHEN 5-325 MG PO TABS
2.0000 | ORAL_TABLET | Freq: Once | ORAL | Status: AC
  Administered 2013-12-01: 2 via ORAL
  Filled 2013-12-01: qty 2

## 2013-12-01 MED ORDER — IBUPROFEN 200 MG PO TABS
600.0000 mg | ORAL_TABLET | Freq: Once | ORAL | Status: AC
  Administered 2013-12-01: 14:00:00 600 mg via ORAL
  Filled 2013-12-01 (×2): qty 1

## 2013-12-01 MED ORDER — OXYCODONE-ACETAMINOPHEN 5-325 MG PO TABS: 1.0000 | ORAL_TABLET | ORAL | Status: DC | PRN

## 2013-12-01 NOTE — ED Notes (Signed)
Pt c/o left sided CP worse with palpation and movement x 1 week; pt seen last night for same but left before being seen

## 2013-12-01 NOTE — ED Notes (Signed)
No answer when called 

## 2013-12-01 NOTE — ED Notes (Signed)
Pt comfortable with d/c and f/u instructions. Prescriptions x3 

## 2013-12-06 NOTE — ED Provider Notes (Signed)
CSN: [id number]     Arrival date & time 12/01/13  1052 History   First MD Initiated Contact with Patient 12/01/13 1208     Chief Complaint  Patient presents with  . Chest Pain   (Consider location/radiation/quality/duration/timing/severity/associated sxs/prior Treatment) HPI  23 year old female with left shoulder/left neck pain. Onset about a week ago. Denies trauma. Gradual onset. Mild ache at rest which is exacerbated by movement and certain positions. Pain radiates down her arm to her hand. Denies or tingling. No loss of strength. No fevers or chills. Has not tried taking anything for her symptoms. No history of back surgery. No sore throat. Past Medical History  Diagnosis Date  . Asthma   . Seasonal allergies   . GERD (gastroesophageal reflux disease)   . Chronic back pain   . Nausea and vomiting     recurrent  . Recurrent abdominal pain    Past Surgical History  Procedure Laterality Date  . Tonsillectomy     History reviewed. No pertinent family history. History  Substance Use Topics  . Smoking status: Current Some Day Smoker -- 0.50 packs/day    Types: Cigarettes  . Smokeless tobacco: Not on file  . Alcohol Use: No   OB History   Grav Para Term Preterm Abortions TAB SAB Ect Mult Living   1              Review of Systems  All systems reviewed and negative, other than as noted in HPI.   Allergies  Hydrocodone  Home Medications   Current Outpatient Rx  Name  Route  Sig  Dispense  Refill  . acetaminophen (TYLENOL) 500 MG tablet   Oral   Take 1,000 mg by mouth every 6 (six) hours as needed for moderate pain or headache.         . albuterol (PROVENTIL HFA;VENTOLIN HFA) 108 (90 BASE) MCG/ACT inhaler   Inhalation   Inhale 2 puffs into the lungs every 6 (six) hours as needed for wheezing or shortness of breath.          . calcium carbonate (TUMS - DOSED IN MG ELEMENTAL CALCIUM) 500 MG chewable tablet   Oral   Chew 1 tablet by mouth 3 (three) times daily  as needed for indigestion or heartburn.         Marland Kitchen ibuprofen (ADVIL,MOTRIN) 200 MG tablet   Oral   Take 600 mg by mouth every 6 (six) hours as needed for mild pain.          . cyclobenzaprine (FLEXERIL) 10 MG tablet   Oral   Take 1 tablet (10 mg total) by mouth 3 (three) times daily as needed for muscle spasms.   12 tablet   0   . ibuprofen (ADVIL,MOTRIN) 600 MG tablet   Oral   Take 1 tablet (600 mg total) by mouth every 6 (six) hours as needed.   30 tablet   0   . oxyCODONE-acetaminophen (PERCOCET/ROXICET) 5-325 MG per tablet   Oral   Take 1 tablet by mouth every 4 (four) hours as needed for severe pain.   10 tablet   0    BP 128/71  Pulse 58  Temp(Src) 98.1 F (36.7 C) (Oral)  Resp 20  SpO2 100%  LMP 09/28/2013 Physical Exam  Nursing note and vitals reviewed. Constitutional: She appears well-developed and well-nourished. No distress.  . No acute distress. Obese.  HENT:  Head: Normocephalic and atraumatic.  Eyes: Conjunctivae are normal. Right eye exhibits no discharge.  Left eye exhibits no discharge.  Neck: Neck supple.  Cardiovascular: Normal rate, regular rhythm and normal heart sounds.  Exam reveals no gallop and no friction rub.   No murmur heard. Pulmonary/Chest: Effort normal and breath sounds normal. No respiratory distress.  Abdominal: Soft. She exhibits no distension. There is no tenderness.  Musculoskeletal: She exhibits no edema and no tenderness.  Tenderness to palpation of the left trapezius and left deltoid. No concerning skin changes noted. Poor range of motion of the shoulder although with increased pain particularly with abduction adduction. Neurovascular intact distally. No midline spinal tenderness. No nuchal rigidity.  Neurological: She is alert.  Skin: Skin is warm and dry.  Psychiatric: She has a normal mood and affect. Her behavior is normal. Thought content normal.    ED Course  Procedures (including critical care time) Labs  Review Labs Reviewed - No data to display Imaging Review No results found.  EKG Interpretation    Date/Time:  Tuesday December 01 2013 10:57:35 EST Ventricular Rate:  56 PR Interval:  166 QRS Duration: 98 QT Interval:  412 QTC Calculation: 397 R Axis:   82 Text Interpretation:  Sinus bradycardia with sinus arrhythmia Otherwise normal ECG No significant change since NO SIGNIFICANT CHANGE SINCE LAST TRACING YESTERDAY Confirmed by Juleen ChinaKOHUT  MD, Brennen Gardiner ([id number]) on 12/01/2013 12:09:49 PM            MDM   1. Shoulder pain, acute, left    22yF with shoulder pain. Suspect strain. Doubt infectious, pe, acs, dissection. No trauma. nonfocal neuro exam. Plan symptomatic tx.     Raeford RazorStephen Jamilla Galli, MD 12/06/13 [phone]

## 2014-01-14 ENCOUNTER — Emergency Department (HOSPITAL_COMMUNITY)
Admission: EM | Admit: 2014-01-14 | Discharge: 2014-01-14 | Disposition: A | Discharge status: Home / Self Care | Payer: No Typology Code available for payment source | Attending: Emergency Medicine | Admitting: Emergency Medicine

## 2014-01-14 ENCOUNTER — Encounter (HOSPITAL_COMMUNITY): Payer: Self-pay | Admitting: Emergency Medicine

## 2014-01-14 DIAGNOSIS — K219 Gastro-esophageal reflux disease without esophagitis: Secondary | ICD-10-CM | POA: Insufficient documentation

## 2014-01-14 DIAGNOSIS — J309 Allergic rhinitis, unspecified: Secondary | ICD-10-CM | POA: Insufficient documentation

## 2014-01-14 DIAGNOSIS — R197 Diarrhea, unspecified: Secondary | ICD-10-CM | POA: Insufficient documentation

## 2014-01-14 DIAGNOSIS — R112 Nausea with vomiting, unspecified: Secondary | ICD-10-CM | POA: Insufficient documentation

## 2014-01-14 DIAGNOSIS — M549 Dorsalgia, unspecified: Secondary | ICD-10-CM | POA: Insufficient documentation

## 2014-01-14 DIAGNOSIS — J45909 Unspecified asthma, uncomplicated: Secondary | ICD-10-CM | POA: Insufficient documentation

## 2014-01-14 DIAGNOSIS — R109 Unspecified abdominal pain: Secondary | ICD-10-CM | POA: Insufficient documentation

## 2014-01-14 DIAGNOSIS — G8929 Other chronic pain: Secondary | ICD-10-CM | POA: Insufficient documentation

## 2014-01-14 DIAGNOSIS — Z79899 Other long term (current) drug therapy: Secondary | ICD-10-CM | POA: Insufficient documentation

## 2014-01-14 DIAGNOSIS — Z886 Allergy status to analgesic agent status: Secondary | ICD-10-CM | POA: Insufficient documentation

## 2014-01-14 DIAGNOSIS — F17200 Nicotine dependence, unspecified, uncomplicated: Secondary | ICD-10-CM | POA: Insufficient documentation

## 2014-01-14 LAB — LIPASE, BLOOD: LIPASE: 23 U/L (ref 11–59)

## 2014-01-14 LAB — CBC WITH DIFFERENTIAL/PLATELET
BASOS ABS: 0.1 10*3/uL (ref 0.0–0.1)
BASOS PCT: 1 % (ref 0–1)
EOS ABS: 0.3 10*3/uL (ref 0.0–0.7)
EOS PCT: 5 % (ref 0–5)
HCT: 37.6 % (ref 36.0–46.0)
Hemoglobin: 12.9 g/dL (ref 12.0–15.0)
LYMPHS PCT: 42 % (ref 12–46)
Lymphs Abs: 2.8 10*3/uL (ref 0.7–4.0)
MCH: 30.8 pg (ref 26.0–34.0)
MCHC: 34.3 g/dL (ref 30.0–36.0)
MCV: 89.7 fL (ref 78.0–100.0)
Monocytes Absolute: 0.5 10*3/uL (ref 0.1–1.0)
Monocytes Relative: 7 % (ref 3–12)
Neutro Abs: 3.1 10*3/uL (ref 1.7–7.7)
Neutrophils Relative %: 46 % (ref 43–77)
PLATELETS: 321 10*3/uL (ref 150–400)
RBC: 4.19 MIL/uL (ref 3.87–5.11)
RDW: 13.1 % (ref 11.5–15.5)
WBC: 6.7 10*3/uL (ref 4.0–10.5)

## 2014-01-14 LAB — POCT PREGNANCY, URINE: Preg Test, Ur: NEGATIVE

## 2014-01-14 LAB — COMPREHENSIVE METABOLIC PANEL
ALBUMIN: 4.1 g/dL (ref 3.5–5.2)
ALT: 26 U/L (ref 0–35)
AST: 24 U/L (ref 0–37)
Alkaline Phosphatase: 76 U/L (ref 39–117)
BUN: 12 mg/dL (ref 6–23)
CALCIUM: 9.3 mg/dL (ref 8.4–10.5)
CO2: 24 meq/L (ref 19–32)
CREATININE: 0.88 mg/dL (ref 0.50–1.10)
Chloride: 104 mEq/L (ref 96–112)
GFR calc Af Amer: >90 mL/min (ref >90)
GFR calc non Af Amer: >90 mL/min (ref >90)
Glucose, Bld: 83 mg/dL (ref 70–99)
Potassium: 4 mEq/L (ref 3.7–5.3)
SODIUM: 142 meq/L (ref 137–147)
TOTAL PROTEIN: 7.3 g/dL (ref 6.0–8.3)
Total Bilirubin: 0.7 mg/dL (ref 0.3–1.2)

## 2014-01-14 MED ORDER — ONDANSETRON 4 MG PO TBDP
4.0000 mg | ORAL_TABLET | Freq: Once | ORAL | Status: AC
  Administered 2014-01-14: 4 mg via ORAL
  Filled 2014-01-14: qty 1

## 2014-01-14 MED ORDER — PROMETHAZINE HCL 25 MG PO TABS: 25.0000 mg | ORAL_TABLET | Freq: Four times a day (QID) | ORAL | Status: DC | PRN

## 2014-01-14 NOTE — ED Notes (Signed)
Pt ambulated to restroom. 

## 2014-01-14 NOTE — ED Provider Notes (Signed)
CSN: [id number]     Arrival date & time 01/14/14  1219 History   None    Chief Complaint  Patient presents with  . Abdominal Pain     (Consider location/radiation/quality/duration/timing/severity/associated sxs/prior Treatment) Patient is a 23 y.o. female presenting with abdominal pain.  Abdominal Pain  23 yo female presents with Abdominal pain last night after eating a "raw" hamburger from [employer]. Approximately 30 minutes after eating patient states that she develop some N/V x 4 episodes and diarrhea about 5 episodes. Admits to crampy lower left abdominal pain rated 8/10 that improves after she has a episode of diarrhea/vomiting.Denies any bloody emesis or diarrhea. Admits to some chills. Denies any fever. Admits to reflux and heartburn. Denies any urinary sxs or vaginal sxs. Patient has not tried anything for sxs. PMH significant for GERD, recurrent N/V, recurrent abdominal pain.  Past Medical History  Diagnosis Date  . Asthma   . Seasonal allergies   . GERD (gastroesophageal reflux disease)   . Chronic back pain   . Nausea and vomiting     recurrent  . Recurrent abdominal pain    Past Surgical History  Procedure Laterality Date  . Tonsillectomy     No family history on file. History  Substance Use Topics  . Smoking status: Current Some Day Smoker -- 0.50 packs/day    Types: Cigarettes  . Smokeless tobacco: Not on file  . Alcohol Use: No   OB History   Grav Para Term Preterm Abortions TAB SAB Ect Mult Living   1              Review of Systems  Gastrointestinal: Positive for abdominal pain.  All other systems reviewed and are negative.      Allergies  Hydrocodone  Home Medications   Current Outpatient Rx  Name  Route  Sig  Dispense  Refill  . calcium carbonate (TUMS - DOSED IN MG ELEMENTAL CALCIUM) 500 MG chewable tablet   Oral   Chew 1-3 tablets by mouth every 4 (four) hours as needed for indigestion or heartburn.          . Esomeprazole Magnesium  (NEXIUM PO)   Oral   Take 1 capsule by mouth daily as needed (acid reflux).         Marland Kitchen. ibuprofen (ADVIL,MOTRIN) 200 MG tablet   Oral   Take 400 mg by mouth 2 (two) times daily as needed for mild pain.          Marland Kitchen. albuterol (PROVENTIL HFA;VENTOLIN HFA) 108 (90 BASE) MCG/ACT inhaler   Inhalation   Inhale 2 puffs into the lungs every 6 (six) hours as needed for wheezing or shortness of breath.          . promethazine (PHENERGAN) 25 MG tablet   Oral   Take 1 tablet (25 mg total) by mouth every 6 (six) hours as needed for nausea or vomiting.   12 tablet   0    BP 138/96  Pulse 83  Temp(Src) 98.3 F (36.8 C) (Oral)  Resp 18  Ht 5\' 7"  (1.702 m)  Wt 248 lb (112.492 kg)  BMI 38.83 kg/m2  SpO2 98%  LMP 12/13/2013 Physical Exam  Nursing note and vitals reviewed. Constitutional: She is oriented to person, place, and time. She appears well-developed and well-nourished. No distress.  HENT:  Head: Normocephalic and atraumatic.  Eyes: Conjunctivae are normal. No scleral icterus.  Neck: No JVD present. No tracheal deviation present.  Cardiovascular: Normal rate and regular rhythm.  Exam reveals no gallop and no friction rub.   No murmur heard. Pulmonary/Chest: Effort normal and breath sounds normal. No respiratory distress. She has no wheezes. She has no rhonchi. She has no rales.  Abdominal: Soft. Bowel sounds are normal. She exhibits no distension. There is no hepatosplenomegaly. There is no tenderness. There is no rigidity, no rebound, no guarding, no tenderness at McBurney's point and negative Murphy's sign.  Musculoskeletal: Normal range of motion. She exhibits no edema.  Neurological: She is alert and oriented to person, place, and time.  Skin: Skin is warm and dry. She is not diaphoretic.  Psychiatric: She has a normal mood and affect. Her behavior is normal.    ED Course  Procedures (including critical care time) Labs Review Labs Reviewed  CBC WITH DIFFERENTIAL   COMPREHENSIVE METABOLIC PANEL  LIPASE, BLOOD  POCT PREGNANCY, URINE   Imaging Review No results found.  EKG Interpretation   None       MDM   Final diagnoses:  Abdominal pain  Nausea, vomiting, and diarrhea    Urine preg negative.  Lipase negative CMP -WNL CBC - WNL  Plan to treat patient's nausea and have her follow up with PCP in 2 days if sxs do not improve. Return precautions given for worsening abdominal pain, fever/chills, or if patient unable to tolerate oral fluids. Discusses results with patient. Patient agrees with plan.   Meds given in ED:  Medications  ondansetron (ZOFRAN-ODT) disintegrating tablet 4 mg (4 mg Oral Given 01/14/14 1542)    Discharge Medication List as of 01/14/2014  3:56 PM    START taking these medications   Details  promethazine (PHENERGAN) 25 MG tablet Take 1 tablet (25 mg total) by mouth every 6 (six) hours as needed for nausea or vomiting., Starting 01/14/2014, Until Discontinued, Print             Allen Norris [place], PA-C 01/16/14 [phone]

## 2014-01-14 NOTE — Discharge Instructions (Signed)
Follow up with a primary care doctor in 2 days if symptoms do not improve. Resource guide attached below for follow up reference. Return to ED should you develop any worsening symptoms, fever/chills, intractable N/V, or worsening pain.    Emergency Department Resource Guide 1) Find a Doctor and Pay Out of Pocket Although you won't have to find out who is covered by your insurance plan, it is a good idea to ask around and get recommendations. You will then need to call the office and see if the doctor you have chosen will accept you as a new patient and what types of options they offer for patients who are self-pay. Some doctors offer discounts or will set up payment plans for their patients who do not have insurance, but you will need to ask so you aren't surprised when you get to your appointment.  2) Contact Your Local Health Department Not all health departments have doctors that can see patients for sick visits, but many do, so it is worth a call to see if yours does. If you don't know where your local health department is, you can check in your phone book. The CDC also has a tool to help you locate your state's health department, and many state websites also have listings of all of their local health departments.  3) Find a Walk-in Clinic If your illness is not likely to be very severe or complicated, you may want to try a walk in clinic. These are popping up all over the country in pharmacies, drugstores, and shopping centers. They're usually staffed by nurse practitioners or physician assistants that have been trained to treat common illnesses and complaints. They're usually fairly quick and inexpensive. However, if you have serious medical issues or chronic medical problems, these are probably not your best option.  No Primary Care Doctor: - Call Health Connect at  [phone] - they can help you locate a primary care doctor that  accepts your insurance, provides certain services,  etc. - Physician Referral Service- [phone]  Chronic Pain Problems: Organization         Address  Phone   Notes  [facility]  [phone]) [phone] Patients need to be referred by their primary care doctor.   Medication Assistance: Organization         Address  Phone   Notes  [facility] Medication [facility] [address] [place], [place] [address] [phone] --Must be a resident of [place] [place] -- Must have NO insurance coverage whatsoever (no Medicaid/ Medicare, etc.) -- The pt. MUST have a primary care doctor that directs their care regularly and follows them in the community   MedAssist  [phone]   [employer]  [phone]    Agencies that provide inexpensive medical care: Organization         Address  Phone   Notes  [facility]  [phone]   [facility]    [phone]   [facility] [address] [place], [place] [address] [phone]) [phone]   [facility] [address], [place] [phone]   Planned Parenthood    [phone](336) [phone]   [facility]    [phone]   [facility]  [address], [place] Phone:  [phone], Fax:  [phone](336) [phone] Hours of Operation:  9 am - 6 pm, M-F.  Also accepts Medicaid/Medicare and self-pay.  [facility] [place] [address], [address], [place] Phone: [phone], Fax: [phone]. Hours of Operation:  8:30 am - 5:30 pm, M-F.  Also accepts Medicaid and self-pay.  [facility] [address] [place] Phone: [phone]   [place], [place], [place] [phone], Ext. [phone] Mondays & Thursdays: 7-9 AM.  First 15 patients are seen on a first come, first serve basis.    [place] Providers:  Organization         Address  Phone   Notes  [facility] [address], [place] [phone] Also accepts self-pay patients.  [facility] [address], [place]  [phone]   [place], [address], [place] [phone]   [facility] [address], [place] [phone]   Lucianne Lei [address], [place]   [phone] Only accepts [place] Access [place] patients after they have their name applied to their card.   Self-Pay (no insurance) in [facility] [place]:  Organization         Address  Phone   Notes  Sickle Cell Patients, [facility] [place] [phone]   [facility] [place] [phone]   [facility] [place]  [place], [place], [place] [phone]   [facility]/Dr. Osei-Bonsu  [address] [place] or [address], [place] [phone] Phone number for both [place] and [place] locations is the same.  Urgent Medical and [facility] [address], [place] [phone]   [facility] [address], [place] or [address] [phone]   [facility] [address] [place] [phone], phone; [phone], fax Sees patients 1st and 3rd Saturday of every month.  Must not qualify for public or private insurance (i.e. Medicaid, Medicare, [place] Health Choice, Veterans' Benefits)  Household income should be no more than 200% of the poverty level The clinic cannot treat you if you are pregnant or think you are pregnant  Sexually transmitted diseases are not treated at the clinic.    Dental Care: Organization         Address  Phone  Notes  [facility] Department of [facility] [place] [phone] Accepts children up to  age 78 who are enrolled in [place] or [place]; pregnant women with a Medicaid card; and children who have applied for Medicaid or [place] Health Choice, but were declined, whose parents can pay a reduced fee at time of service.  [facility] of [facility]  [address], [place] [phone] Accepts children up to age 39 who are enrolled in [place] or [place]; pregnant women with a Medicaid card; and children who have applied for Medicaid or [place] Health Choice, but were declined, whose parents can pay a reduced fee at time of service.  [facility] Adult Dental Access PROGRAM  [place] [phone] Patients are seen by appointment only. Walk-ins are not accepted. [place] will see patients 78 years of age and older. Monday - Tuesday (8am-5pm) Most Wednesdays (8:30-5pm) $30 per  visit, cash only  [facility] Adult [employer] PROGRAM  [address], [facility] [phone] Patients are seen by appointment only. Walk-ins are not accepted. [place] will see patients 25 years of age and older. One Wednesday Evening (Monthly: Volunteer Based).  $30 per visit, cash only  [place]  [phone] for adults; Children under age 88, call Graduate Pediatric Dentistry at [phone]. Children aged 53-14, please call [phone] to request a pediatric application.  Dental services are provided in all areas of dental care including fillings, crowns and bridges, complete and partial dentures, implants, gum treatment, root canals, and extractions. Preventive care is also provided. Treatment is provided to both adults and children. Patients are selected via a lottery and there is often a waiting list.   [facility] [address] [place]  [phone] [email or web address]   [facility] [address] [place], [place] [phone], Ext. [phone] Second and Fourth Thursday of  each month, opens at 6:30 AM; Clinic ends at 9 AM.  Patients are seen on a first-come first-served basis, and a limited number are seen during each clinic.   [facility]  [address] Hillard Danker [place], [place] [phone]   Eligibility Requirements You must have lived in [place], [place], or [place] counties for at least the last three months.   You cannot be eligible for state or federal sponsored [employer], including [employer], [place], or [employer].   You generally cannot be eligible for healthcare insurance through your employer.    How to apply: Eligibility screenings are held every Tuesday and Wednesday afternoon from 1:00 pm until 4:00 pm. You do not need an appointment for the interview!  [facility] [address], [place], [place]   [place]  [place] Department  [place]  [phone]    Behavioral Health Resources in the Community: Intensive Outpatient Programs Organization         Address  Phone  Notes  [place]. [address] [place], [place] [phone]   [facility]patient [address] [place], [place]   ADS: Alcohol & Drug Svcs [address] [place], [place]   [place] [address],  [place], [place] or [phone]   Substance Abuse Resources Organization         Address  Phone  Notes  Alcohol and Drug Services  [phone]   [place]  [phone]   [facility] [place]   Chinita Pester  [phone]   Residential & Outpatient Substance Abuse Program  [phone]   Psychological Services Organization         Address  Phone  Notes  [facility]  [place]  [phone]   [place] [address],  [place] or [phone]    Mobile Crisis Teams Organization         Address  Phone  Notes  [facility], Mobile Crisis Care Unit  [phone]   [facility]  [address] [place], [place]   [place] [address], [place] [phone]    Self-Help/Support Groups Organization         Address  Phone  Notes  [facility] [place] - variety of support groups  [place] Call for more information  Narcotics Anonymous (NA), [facility] [address], [employer] [place]  2 meetings at this location   Special educational needs teacher         Address  Phone  Notes  [facility]AP Residential Treatment [place],    [place]  [phone]   [facility]  [address], [place] [phone], [place], [place]   [place], [place] [phone] Admissions: 8am-3pm M-F  Incentives Substance [place] [address],    [place], [place] [phone]   [facility] [address] [place], [place], [place]   [facility] [address]  [place], [place]   [facility] Programs - Intensive Outpatient [address], [place], [place]   [facility] ([place].,  [place], [place] [phone] or [phone]   [facility] Services (RTS) [address] [place], [place] Accepts Medicaid  [facility] [place] [address],  [place] [phone] Substance Abuse/Addiction Treatment   [facility] Organization         Address  Phone  Notes  [facility]  [phone]   Domenic Schwab, PhD [address] Arlis Porta [place], [place]   [phone] or [phone]   [place], [place] [phone]     [facility] [address], [place], [place] [phone] Insurance/Medicaid/sponsorship through [facility] and Families [address], [address]                                    [place], [place] [phone] [place] [address], [place] [phone]    Dr. Adele Schilder  [phone]   [facility] Dept. 1) [address], [place] 2) [place] 3)  [address], [place] [phone]  [phone]   [place] [phone] or [phone] (After Hours)

## 2014-01-14 NOTE — ED Notes (Signed)
Pt with acute onset nvd since last night.  States she at meat that was raw at [employer] last night.  Also c/o feeling lightheaded.

## 2014-01-17 NOTE — ED Provider Notes (Signed)
Pt is a 23 y/o female , CC of n/v/d. States started last night after eating poorly cooked meat at [employer]. She has had persistent sx since that time but is gradually improving. Denies f/c, denies dysuria or pregnancy, no hx of abd surgery. She has had no meds pta. Is Sex active, no protection, hx of one miscarriage. At this time she has no abd pain.  On exam, she is obese, soft, NT, ND abd, normal BS. She has clear heart adn lung sounds, no jaundice or sceral icterus, no edema and is in no distress.   Given no abd ttp and n/v/d which is resolving, suspect an acute GI illness or some mild food poisoning. She declines further w/u with imaging and IV meds and requests PO nausea meds and PO challenge -states she has improved significantly spontaneously while in the waiting room. Appears stable for d/c.   Medical screening examination/treatment/procedure(s) were conducted as a shared visit with non-physician practitioner(s) and myself. I personally evaluated the patient during the encounter.   Clinical Impression: Nausea, Vomiting and Diarrhea.   Vida RollerBrian D Khang Hannum, MD 01/17/14 [phone]

## 2014-04-05 ENCOUNTER — Emergency Department (HOSPITAL_COMMUNITY)
Admission: EM | Admit: 2014-04-05 | Discharge: 2014-04-05 | Disposition: A | Discharge status: Home / Self Care | Payer: No Typology Code available for payment source | Attending: Emergency Medicine | Admitting: Emergency Medicine

## 2014-04-05 ENCOUNTER — Encounter (HOSPITAL_COMMUNITY): Payer: Self-pay | Admitting: Emergency Medicine

## 2014-04-05 DIAGNOSIS — J45909 Unspecified asthma, uncomplicated: Secondary | ICD-10-CM | POA: Insufficient documentation

## 2014-04-05 DIAGNOSIS — K219 Gastro-esophageal reflux disease without esophagitis: Secondary | ICD-10-CM | POA: Insufficient documentation

## 2014-04-05 DIAGNOSIS — A499 Bacterial infection, unspecified: Secondary | ICD-10-CM | POA: Insufficient documentation

## 2014-04-05 DIAGNOSIS — Z3202 Encounter for pregnancy test, result negative: Secondary | ICD-10-CM | POA: Insufficient documentation

## 2014-04-05 DIAGNOSIS — R42 Dizziness and giddiness: Secondary | ICD-10-CM | POA: Insufficient documentation

## 2014-04-05 DIAGNOSIS — N760 Acute vaginitis: Secondary | ICD-10-CM | POA: Insufficient documentation

## 2014-04-05 DIAGNOSIS — R112 Nausea with vomiting, unspecified: Secondary | ICD-10-CM | POA: Insufficient documentation

## 2014-04-05 DIAGNOSIS — K5900 Constipation, unspecified: Secondary | ICD-10-CM | POA: Insufficient documentation

## 2014-04-05 DIAGNOSIS — B9689 Other specified bacterial agents as the cause of diseases classified elsewhere: Secondary | ICD-10-CM | POA: Insufficient documentation

## 2014-04-05 DIAGNOSIS — F17200 Nicotine dependence, unspecified, uncomplicated: Secondary | ICD-10-CM | POA: Insufficient documentation

## 2014-04-05 DIAGNOSIS — N390 Urinary tract infection, site not specified: Secondary | ICD-10-CM | POA: Insufficient documentation

## 2014-04-05 DIAGNOSIS — G8929 Other chronic pain: Secondary | ICD-10-CM | POA: Insufficient documentation

## 2014-04-05 LAB — URINALYSIS, ROUTINE W REFLEX MICROSCOPIC
Bilirubin Urine: NEGATIVE
GLUCOSE, UA: NEGATIVE mg/dL
Ketones, ur: NEGATIVE mg/dL
Nitrite: POSITIVE — AB
PROTEIN: NEGATIVE mg/dL
Specific Gravity, Urine: 1.027 (ref 1.005–1.030)
Urobilinogen, UA: 1 mg/dL (ref 0.0–1.0)
pH: 6 (ref 5.0–8.0)

## 2014-04-05 LAB — CBC WITH DIFFERENTIAL/PLATELET
Basophils Absolute: 0.1 10*3/uL (ref 0.0–0.1)
Basophils Relative: 1 % (ref 0–1)
EOS ABS: 0.4 10*3/uL (ref 0.0–0.7)
Eosinophils Relative: 7 % — ABNORMAL HIGH (ref 0–5)
HCT: 39.4 % (ref 36.0–46.0)
HEMOGLOBIN: 13.3 g/dL (ref 12.0–15.0)
LYMPHS ABS: 2.5 10*3/uL (ref 0.7–4.0)
LYMPHS PCT: 41 % (ref 12–46)
MCH: 30.6 pg (ref 26.0–34.0)
MCHC: 33.8 g/dL (ref 30.0–36.0)
MCV: 90.8 fL (ref 78.0–100.0)
Monocytes Absolute: 0.4 10*3/uL (ref 0.1–1.0)
Monocytes Relative: 7 % (ref 3–12)
NEUTROS PCT: 44 % (ref 43–77)
Neutro Abs: 2.7 10*3/uL (ref 1.7–7.7)
Platelets: 301 10*3/uL (ref 150–400)
RBC: 4.34 MIL/uL (ref 3.87–5.11)
RDW: 12.9 % (ref 11.5–15.5)
WBC: 6.1 10*3/uL (ref 4.0–10.5)

## 2014-04-05 LAB — COMPREHENSIVE METABOLIC PANEL
ALT: 26 U/L (ref 0–35)
AST: 22 U/L (ref 0–37)
Albumin: 4.3 g/dL (ref 3.5–5.2)
Alkaline Phosphatase: 95 U/L (ref 39–117)
BILIRUBIN TOTAL: 0.5 mg/dL (ref 0.3–1.2)
BUN: 17 mg/dL (ref 6–23)
CHLORIDE: 101 meq/L (ref 96–112)
CO2: 22 meq/L (ref 19–32)
Calcium: 9.1 mg/dL (ref 8.4–10.5)
Creatinine, Ser: 0.92 mg/dL (ref 0.50–1.10)
GFR calc non Af Amer: 87 mL/min — ABNORMAL LOW (ref >90)
GLUCOSE: 115 mg/dL — AB (ref 70–99)
POTASSIUM: 4 meq/L (ref 3.7–5.3)
Sodium: 138 mEq/L (ref 137–147)
TOTAL PROTEIN: 7.6 g/dL (ref 6.0–8.3)

## 2014-04-05 LAB — LIPASE, BLOOD: Lipase: 25 U/L (ref 11–59)

## 2014-04-05 LAB — URINE MICROSCOPIC-ADD ON

## 2014-04-05 LAB — POC URINE PREG, ED: Preg Test, Ur: NEGATIVE

## 2014-04-05 LAB — WET PREP, GENITAL
TRICH WET PREP: NONE SEEN
YEAST WET PREP: NONE SEEN

## 2014-04-05 MED ORDER — ONDANSETRON HCL 4 MG/2ML IJ SOLN
4.0000 mg | Freq: Once | INTRAMUSCULAR | Status: AC
  Administered 2014-04-05: 4 mg via INTRAVENOUS
  Filled 2014-04-05: qty 2

## 2014-04-05 MED ORDER — NAPROXEN 250 MG PO TABS
500.0000 mg | ORAL_TABLET | Freq: Two times a day (BID) | ORAL | Status: DC
Start: 2014-04-05 — End: 2014-04-05
  Administered 2014-04-05: 500 mg via ORAL
  Filled 2014-04-05: qty 2

## 2014-04-05 MED ORDER — METRONIDAZOLE 500 MG PO TABS: 500.0000 mg | ORAL_TABLET | Freq: Two times a day (BID) | ORAL | Status: DC

## 2014-04-05 MED ORDER — CEFTRIAXONE SODIUM 250 MG IJ SOLR
250.0000 mg | Freq: Once | INTRAMUSCULAR | Status: AC
  Administered 2014-04-05: 250 mg via INTRAMUSCULAR
  Filled 2014-04-05: qty 250

## 2014-04-05 MED ORDER — CEPHALEXIN 500 MG PO CAPS: 500.0000 mg | ORAL_CAPSULE | Freq: Four times a day (QID) | ORAL | Status: DC

## 2014-04-05 MED ORDER — AZITHROMYCIN 1 G PO PACK
1.0000 g | PACK | Freq: Once | ORAL | Status: AC
  Administered 2014-04-05: 1 g via ORAL
  Filled 2014-04-05: qty 1

## 2014-04-05 MED ORDER — LIDOCAINE HCL (PF) 1 % IJ SOLN
INTRAMUSCULAR | Status: AC
  Administered 2014-04-05: 2 mL
  Filled 2014-04-05: qty 5

## 2014-04-05 NOTE — Discharge Instructions (Signed)
Abdominal (belly) pain can be caused by many things. Your caregiver performed an examination and possibly ordered blood/urine tests and imaging (CT scan, x-rays, ultrasound). Many cases can be observed and treated at home after initial evaluation in the emergency department. Even though you are being discharged home, abdominal pain can be unpredictable. Therefore, you need a repeated exam if your pain does not resolve, returns, or worsens. Most patients with abdominal pain don't have to be admitted to the hospital or have surgery, but serious problems like appendicitis and gallbladder attacks can start out as nonspecific pain. Many abdominal conditions cannot be diagnosed in one visit, so follow-up evaluations are very important. °SEEK IMMEDIATE MEDICAL ATTENTION IF: °The pain does not go away or becomes severe.  °A temperature above 101 develops.  °Repeated vomiting occurs (multiple episodes).  °The pain becomes localized to portions of the abdomen. The right side could possibly be appendicitis. In an adult, the left lower portion of the abdomen could be colitis or diverticulitis.  °Blood is being passed in stools or vomit (bright red or black tarry stools).  °Return also if you develop chest pain, difficulty breathing, dizziness or fainting, or become confused, poorly responsive, or inconsolable (young children). ° ° ° ° ° ° °  Emergency Department Resource Guide °1) Find a Doctor and Pay Out of Pocket °Although you won't have to find out who is covered by your insurance plan, it is a good idea to ask around and get recommendations. You will then need to call the office and see if the doctor you have chosen will accept you as a new patient and what types of options they offer for patients who are self-pay. Some doctors offer discounts or will set up payment plans for their patients who do not have insurance, but you will need to ask so you aren't surprised when you get to your appointment. ° °2) Contact Your Local  Health Department °Not all health departments have doctors that can see patients for sick visits, but many do, so it is worth a call to see if yours does. If you don't know where your local health department is, you can check in your phone book. The CDC also has a tool to help you locate your state's health department, and many state websites also have listings of all of their local health departments. ° °3) Find a Walk-in Clinic °If your illness is not likely to be very severe or complicated, you may want to try a walk in clinic. These are popping up all over the country in pharmacies, drugstores, and shopping centers. They're usually staffed by nurse practitioners or physician assistants that have been trained to treat common illnesses and complaints. They're usually fairly quick and inexpensive. However, if you have serious medical issues or chronic medical problems, these are probably not your best option. ° °No Primary Care Doctor: °- Call [facility] at  832-8000 - they can help you locate a primary care doctor that  accepts your insurance, provides certain services, etc. °- Physician Referral Service- 1-800-533-3463 ° °Chronic Pain Problems: °Organization         Address  Phone   Notes  °[facility]  (336) 297-2271 Patients need to be referred by their primary care doctor.  ° °Medication Assistance: °Organization         Address  Phone   Notes  °[place] [facility] [address] °[place], [place] [address] (336) 641-8030 --Must be a   resident of [place] °-- Must have NO insurance coverage whatsoever (no Medicaid/ Medicare, etc.) °-- The pt. MUST have a primary care doctor that directs their care regularly and follows them in the community °  °[facility]  (866) 331-1348   °[employer]  (888) 892-1162   ° °Agencies that provide inexpensive medical care: °Organization         Address  Phone   Notes  °[facility]  (336) 832-8035    °[facility]    (336) 832-7272   °[facility] [address] °[place], [place] [address] (336) 832-4777   °[facility] [address], °[place] (336) 271-4999   °[facility]    (336) 373-0678   °[facility]    (336) 272-1050   °[facility] ° [address], [place] Phone:  (336) 832-4444, Fax:  (336) 832-4440 Hours of Operation:  9 am - 6 pm, M-F.  Also accepts Medicaid/Medicare and self-pay.  °[facility] ° [address], [place] Phone: (336) 832-3150, Fax: (336) 832-3151. Hours of Operation:  8:30 am - 5:30 pm, M-F.  Also accepts Medicaid and self-pay.  °[facility] [place] [address], [place] Phone: (336) 878-6027   °[facility] [address], [place], [place] (336)723-1848, Ext. 123 Mondays & Thursdays: 7-9 AM.  First 15 patients are seen on a first come, first serve basis. °  ° °Medicaid-accepting [place] Providers: ° °Organization         Address  Phone   Notes  °[facility] [address], [place] (336) 641-2100 Also accepts self-pay patients.  °[facility] [address], [place] ° (336) 856-9996   °[facility] [address], [place] (336) 288-8857   °[facility] [address] [place] [address], [place] (336) 299-7000   °Veita Bland [address], [place]  ° (336) 373-1557 Only accepts [place] Access Medicaid patients after they have their name applied to their card.  ° °Self-Pay (no insurance) in [place]: ° °Organization         Address  Phone   Notes  °Sickle Cell Patients, [facility] [address], [place] (336) 832-1970   °[facility] [address], [place] (336) 832-4400   °[facility] [place] ° [address],  [place] (336) 992-4800   °[facility]/Dr. Osei-Bonsu ° [address] [place] [address], [place] or [address], [place] (336) 841-8500 Phone number for both [place] and [place] locations is the same.  °[facility] [address], [place] (336) 299-0000   °[facility] [address] [place] [address],  or [address] (336) 852-7530 °(336) 878-2260   °[facility] [address],  (336) 350-1642, phone; (336) 294-5005, fax Sees patients 1st and 3rd Saturday of every month.  Must not qualify for public or private insurance (i.e. Medicaid, Medicare, [place] Health Choice, Veterans' Benefits) • Household income should be no more than 200% of the poverty level •The clinic cannot treat you if you are pregnant or think you are pregnant • Sexually transmitted diseases are not treated at the clinic.  ° °Dental Care: °Organization         Address    Phone  Notes  °[facility] [facility] [address], [place] (336) 641-6152 Accepts children up to age 21 who are enrolled in Medicaid or [place] Health Choice; pregnant women with a Medicaid card; and children who have applied for Medicaid or [place] Health Choice, but were declined, whose parents can pay a reduced fee at time of service.  °[facility] [place]  [address], [place] (336) 641-7733 Accepts children up to age 21 who are enrolled in Medicaid or [place] Health Choice; pregnant women with a Medicaid card; and children who have applied for Medicaid or [place] Health Choice, but were declined, whose parents can pay a reduced fee at time of service.  °[facility] ° [address], [place] (336) 641-4533 Patients are seen by appointment only. Walk-ins are not accepted. [facility] will see patients 18 years of age and older. °Monday - Tuesday (8am-5pm) °Most Wednesdays  (8:30-5pm) °$30 per visit, cash only  °[facility] ° [address], [place] (336) 641-4533 Patients are seen by appointment only. Walk-ins are not accepted. [facility] will see patients 18 years of age and older. °One Wednesday Evening (Monthly: Volunteer Based).  $30 per visit, cash only  °[facility]  (919) 537-3737 for adults; Children under age 4, call Graduate Pediatric Dentistry at (919) [phone]. Children aged 4-14, please call (919) [phone] to request a pediatric application. ° Dental services are provided in all areas of dental care including fillings, crowns and bridges, complete and partial dentures, implants, gum treatment, root canals, and extractions. Preventive care is also provided. Treatment is provided to both adults and children. °Patients are selected via a lottery and there is often a waiting list. °  °[facility] [address], °[place] ° (336) 763-8833 [email or web address] °  °[facility] [address], [place], [place] (336)723-1848, Ext. 123 Second and Fourth Thursday of each month, opens at 6:30 AM; Clinic ends at 9 AM.  Patients are seen on a first-come first-served basis, and a limited number are seen during each clinic.  ° °[facility] ° [address], [place], [place] (336) 723-7904   Eligibility Requirements °You must have lived in [place], [place], or [place] counties for at least the last three months. °  You cannot be eligible for state or federal sponsored healthcare insurance, including Veterans Administration, Medicaid, or Medicare. °  You generally cannot be eligible for healthcare insurance through your employer.  °  How to apply: °Eligibility screenings are held every Tuesday and Wednesday afternoon from 1:00 pm until 4:00 pm. You do not need an appointment for the interview!  °[facility] [address], [place], [place] 336-631-2330   °[place]  [facility]  336-342-8273   °[place]  336-703-3100   °[facility]  336-570-6415   ° °Behavioral Health Resources in the Community: °Intensive Outpatient Programs °Organization         Address  Phone  Notes  °[facility] [address], [place], [place] 336-878-6098   °[facility] [address], [place], [place] 336-832-9800   °[facility] [address], [place], [place] ° 336-882-2125   °[facility] [address],  °[place],  1-800-853-5163 or 336-641-4981   °Substance Abuse Resources °Organization           Address  Phone  Notes  °[facility]  336-882-2125   °[facility]  336-784-9470   °[facility]  336-285-9073   °[facility]  336-845-3988   °Residential & Outpatient Substance Abuse Program  1-800-659-3381   °Psychological Services °Organization         Address  Phone  Notes  °[facility]  336- 832-9600   °[facility]  336- 378-7881   °[facility] [address], [place] 1-800-853-5163 or 336-641-4981   ° °Mobile Crisis Teams °Organization         Address  Phone  Notes  °[facility], Mobile Crisis Care Unit  1-877-626-1772   °Assertive °[facility] ° [address] [place], [place] 336-834-9664   °Sharon DeEsch [address] °[place] 336-554-5454   ° °Self-Help/Support Groups °Organization         Address  Phone             Notes  °[facility] - variety of support groups  336- 373-1402 Call for more information  °Narcotics Anonymous (NA), [facility] [address], °[place]  2 meetings at this location  ° °Residential Treatment Programs °Organization         Address  Phone  Notes  °[facility] [address],    °[place]  1-866-801-8205   °[facility] ° [address], [place], [place]  704-293-8524   °[facility] [address], [place] 336-845-3988 Admissions: 8am-3pm M-F  °[facility] [address]    °[place], [place] 336-841-1104   °[facility] [address], [place], [place] 336-379-7146   °[facility] [address]  °[place], [place] 336-285-9073   °[facility] Intensive Outpatient [address], [place], [place] 336-852-3033   °[facility] [address]  °[place], [place] 1-877-615-2722 or 336-784-9470   °[facility] [address] [place], [place] 336-227-7417 Accepts Medicaid  °[facility] [address]  °[place] 1-800-659-3381 Substance Abuse/Addiction Treatment  ° °[place] Behavioral Health Resources °Organization         Address  Phone  Notes  °[facility]  (888) 581-9988   °Julie Brannon, PhD [address] [place], [place]   (336) 349-5553 or (336) 951-0000   °[facility]   [address] °[place], [place] (336) 349-4454   °[facility] [address], [place], [place] (336) 342-8316 Insurance/Medicaid/sponsorship through [facility]  °[facility] [address]                                    [place], [place] (336) 342-8316 Therapy/tele-psych/case  °[facility] [address]  ° [place], [place] (336) 349-2233    °Dr. Arfeen  (336) 349-4544   °[facility] [place]  [employer] [place] [facility] 1) [address], [place] °2) [address], [place] °3)  [address], [place] (336) 349-3220 °(336) 342-7768 ° °(336) 342-8140   °[place] [facility] (336) 342-1394 or (336) 342-3537 (After Hours)    ° °   °

## 2014-04-05 NOTE — ED Provider Notes (Signed)
CSN: [id number]     Arrival date & time 04/05/14  0820 History   First MD Initiated Contact with Patient 04/05/14 [phone]     Chief Complaint  Patient presents with  . Abdominal Pain  . Dizziness     (Consider location/radiation/quality/duration/timing/severity/associated sxs/prior Treatment) The history is provided by the patient.   history of present illness: 23 year old female who presents to the emergency department with multiple complaints. Patient's primary complaint is left side pain. However today she reports the left side is not hurting and it is more on the right side. Onset of symptoms was about 2 weeks ago. For the past 3 days she has been sleeping on the floor and reports that that has exacerbated the discomfort in her right side. During this time she is also had associated intermittent nausea and a few episodes of nonbloody nonbilious emesis. Patient reports she felt nauseous last night but has not had any episodes of emesis for the past 2 days. She has also had intermittent constipation and diarrhea. Currently patient has felt more constipated and has had associated generalized "crampy" abdominal discomfort. No localized pain. Starting one week ago she had some white vaginal discharge and has had some intermittent vaginal burning. Patient thinks it may be related to a new soap that she used at a hotel but would also be concerned for possible exposure to sexually transmitted infection. No fevers or vaginal bleeding.  Past Medical History  Diagnosis Date  . Asthma   . Seasonal allergies   . GERD (gastroesophageal reflux disease)   . Chronic back pain   . Nausea and vomiting     recurrent  . Recurrent abdominal pain    Past Surgical History  Procedure Laterality Date  . Tonsillectomy     No family history on file. History  Substance Use Topics  . Smoking status: Current Some Day Smoker -- 0.50 packs/day    Types: Cigarettes  . Smokeless tobacco: Not on file  . Alcohol Use:  No   OB History   Grav Para Term Preterm Abortions TAB SAB Ect Mult Living   1              Review of Systems  Constitutional: Negative for fever and chills.  HENT: Negative for congestion.   Eyes: Negative for pain.  Respiratory: Negative for shortness of breath.   Cardiovascular: Negative for chest pain.  Gastrointestinal: Positive for nausea, vomiting, abdominal pain and constipation. Negative for blood in stool.  Genitourinary: Positive for dysuria, vaginal discharge and vaginal pain (intermittent burning). Negative for frequency, hematuria, vaginal bleeding, menstrual problem and pelvic pain.  Musculoskeletal: Negative for back pain.  Skin: Negative for rash and wound.  Neurological: Negative for headaches.  All other systems reviewed and are negative.     Allergies  Hydrocodone  Home Medications   Prior to Admission medications   Medication Sig Start Date End Date Taking? Authorizing Provider  albuterol (PROVENTIL HFA;VENTOLIN HFA) 108 (90 BASE) MCG/ACT inhaler Inhale 2 puffs into the lungs every 6 (six) hours as needed for wheezing or shortness of breath.     Historical Provider, MD  calcium carbonate (TUMS - DOSED IN MG ELEMENTAL CALCIUM) 500 MG chewable tablet Chew 1-3 tablets by mouth every 4 (four) hours as needed for indigestion or heartburn.     Historical Provider, MD  Esomeprazole Magnesium (NEXIUM PO) Take 1 capsule by mouth daily as needed (acid reflux).    Historical Provider, MD  ibuprofen (ADVIL,MOTRIN) 200 MG tablet Take  400 mg by mouth 2 (two) times daily as needed for mild pain.     Historical Provider, MD  promethazine (PHENERGAN) 25 MG tablet Take 1 tablet (25 mg total) by mouth every 6 (six) hours as needed for nausea or vomiting. 01/14/14   Rudene AndaJacob Gray Lackey, PA-C   BP 116/72  Pulse 59  Temp(Src) 98 F (36.7 C) (Oral)  Resp 16  Ht 5\' 7"  (1.702 m)  Wt 234 lb (106.142 kg)  BMI 36.64 kg/m2  SpO2 99%  LMP 03/09/2014 Physical Exam  Nursing note and  vitals reviewed. Constitutional: She is oriented to person, place, and time. She appears well-developed and well-nourished. No distress.  HENT:  Head: Normocephalic and atraumatic.  Eyes: Conjunctivae are normal.  Neck: Neck supple.  Cardiovascular: Normal rate, regular rhythm, normal heart sounds and intact distal pulses.   Pulmonary/Chest: Effort normal and breath sounds normal. She has no wheezes. She has no rales.  Abdominal: Soft. She exhibits no distension. There is no hepatosplenomegaly. There is generalized tenderness. There is no rigidity, no guarding, no CVA tenderness, no tenderness at McBurney's point and negative Murphy's sign.  Genitourinary: Cervix exhibits no motion tenderness. Right adnexum displays no mass, no tenderness and no fullness. Left adnexum displays no mass, no tenderness and no fullness. Vaginal discharge (white) found.  Musculoskeletal: Normal range of motion.  Neurological: She is alert and oriented to person, place, and time.  Skin: Skin is warm and dry.    ED Course  Procedures (including critical care time) Labs Review Labs Reviewed  WET PREP, GENITAL - Abnormal; Notable for the following:    Clue Cells Wet Prep HPF POC FEW (*)    WBC, Wet Prep HPF POC FEW (*)    All other components within normal limits  CBC WITH DIFFERENTIAL - Abnormal; Notable for the following:    Eosinophils Relative 7 (*)    All other components within normal limits  COMPREHENSIVE METABOLIC PANEL - Abnormal; Notable for the following:    Glucose, Bld 115 (*)    GFR calc non Af Amer 87 (*)    All other components within normal limits  URINALYSIS, ROUTINE W REFLEX MICROSCOPIC - Abnormal; Notable for the following:    APPearance CLOUDY (*)    Hgb urine dipstick SMALL (*)    Nitrite POSITIVE (*)    Leukocytes, UA SMALL (*)    All other components within normal limits  URINE MICROSCOPIC-ADD ON - Abnormal; Notable for the following:    Bacteria, UA MANY (*)    All other  components within normal limits  GC/CHLAMYDIA PROBE AMP  URINE CULTURE  LIPASE, BLOOD  POC URINE PREG, ED    Imaging Review No results found.   EKG Interpretation None      MDM   Final diagnoses:  Urinary tract infection  Bacterial vaginosis    Danielle Zimmerman is a 23 y.o. female with here with 2 weeks of intermittent "crampy" abdominal discomfort, intermittent nausea and vomiting, white vaginal discharge and vaginal burning. AF VSS.  Exam with very minimal abdominal tenderness to palpation. Nonsurgical abdomen. No focal tenderness to palpation. White vaginal discharge. No cervical motion tenderness, adnexal tenderness, adnexal fullness, or adnexal mass.  Concern for UTI, STI, BV.  Given presentation and exam, have low suspicion for appendicitis, diverticulitis, gallbladder or pancreatic etiology.  UPT negative, doubt ectopic. No pelvic pain or tenderness on exam, doubt PID, ectopic, TOA.  Labs/Imaging: CBC, CMP, lipase, UA, UPT.  Giving Zofran.  UA with  positive nitrites leuks consistent with UTI. Patient concerned for possible STI exposure. Will give him. Rocephin and azithromycin.   UA consistent with urinary tract infection will give course of Keflex. Wet prep with clue cells consistent with BV and will give course of Flagyl. Patient provided contact information for followup. Term precautions given.  Cherre Robins, MD 04/05/14 1535

## 2014-04-05 NOTE — ED Notes (Signed)
Pt c/o left sided abdominal pain, N/V, milky white vaginal discharge, vaginal burning and lightheadedness ongoing for about 2 weeks. Pt also reports not sure if she is pregnant or not. Pt used a new soap that has caused the burning followed by discharge. Pt reports that she sleeps on the floor for past 3 days and that has been making the pain in her left side worse.

## 2014-04-05 NOTE — ED Provider Notes (Signed)
Medical screening examination/treatment/procedure(s) were conducted as a shared visit with non-physician practitioner(s) or resident and myself. I personally evaluated the patient during the encounter and agree with the findings and plan unless otherwise indicated.  I have personally reviewed any xrays and/ or EKG's with the provider and I agree with interpretation.  Patient with STD history presents with left-sided abdominal pain, vaginal burning and discharge, nausea and mild vomiting intermittent for the past week. Patient has had similar symptoms in the past. No kidney stone history known. On exam abdomen soft, minimal pelvic tenderness, no focal flank pain, well-appearing, mild dry mucous membranes. Pelvic exam present no. Concern for UTI/pregnancy/STDs. Recent possible exposure to STDs. Plan on prophylactic STD treatment and UTI treatment with outpatient followup.  Vaginal discharge, left flank pain, UTI   Danielle SkeensJoshua M Sheryle Vice, MD 04/05/14 [phone]

## 2014-04-06 LAB — GC/CHLAMYDIA PROBE AMP
CT PROBE, AMP APTIMA: NEGATIVE
GC Probe RNA: NEGATIVE

## 2014-04-07 LAB — URINE CULTURE

## 2014-04-09 ENCOUNTER — Telehealth (HOSPITAL_BASED_OUTPATIENT_CLINIC_OR_DEPARTMENT_OTHER): Payer: Self-pay

## 2014-04-09 NOTE — Telephone Encounter (Signed)
Post ED Visit - Positive Culture Follow-up  Culture report reviewed by antimicrobial stewardship pharmacist: []  Danielle Zimmerman, Pharm.D., BCPS [x]  Danielle Zimmerman, Pharm.D., BCPS []  Danielle Zimmerman, [address] BCPS []  Danielle Zimmerman, [address] BCPS, AAHIVP []  Danielle Zimmerman, Pharm.D., BCPS, AAHIVP []  Danielle Zimmerman, Pharm.D.  Positive Urine culture >/= 100,000 colonies -> E Coli  Treated with Cephalexin, organism sensitive to the same and no further patient follow-up is required at this time.  Danielle Zimmerman 04/09/2014, 9:32 AM

## 2014-04-28 ENCOUNTER — Encounter (HOSPITAL_COMMUNITY): Payer: Self-pay | Admitting: Emergency Medicine

## 2014-04-28 ENCOUNTER — Emergency Department (HOSPITAL_COMMUNITY)
Admission: EM | Admit: 2014-04-28 | Discharge: 2014-04-28 | Discharge status: Against Medical Advice | Payer: No Typology Code available for payment source | Attending: Emergency Medicine | Admitting: Emergency Medicine

## 2014-04-28 DIAGNOSIS — J45909 Unspecified asthma, uncomplicated: Secondary | ICD-10-CM | POA: Insufficient documentation

## 2014-04-28 DIAGNOSIS — R109 Unspecified abdominal pain: Secondary | ICD-10-CM | POA: Insufficient documentation

## 2014-04-28 DIAGNOSIS — G8929 Other chronic pain: Secondary | ICD-10-CM | POA: Insufficient documentation

## 2014-04-28 DIAGNOSIS — F17200 Nicotine dependence, unspecified, uncomplicated: Secondary | ICD-10-CM | POA: Insufficient documentation

## 2014-04-28 DIAGNOSIS — R1110 Vomiting, unspecified: Secondary | ICD-10-CM | POA: Insufficient documentation

## 2014-04-28 NOTE — ED Notes (Signed)
Attempted to call name x3, no answer.

## 2014-04-28 NOTE — ED Notes (Addendum)
3 days stomach pain and throwing up worse after she eats. Reports throat burning and throwing up blood. Possibly pregnant.

## 2014-04-28 NOTE — ED Notes (Signed)
Attempted to call to room x 2 with no response.

## 2014-06-18 ENCOUNTER — Encounter (HOSPITAL_COMMUNITY): Payer: Self-pay | Admitting: Emergency Medicine

## 2014-06-18 ENCOUNTER — Emergency Department (HOSPITAL_COMMUNITY)
Admission: EM | Admit: 2014-06-18 | Discharge: 2014-06-18 | Disposition: A | Discharge status: Home / Self Care | Payer: No Typology Code available for payment source | Attending: Emergency Medicine | Admitting: Emergency Medicine

## 2014-06-18 DIAGNOSIS — A64 Unspecified sexually transmitted disease: Secondary | ICD-10-CM | POA: Insufficient documentation

## 2014-06-18 DIAGNOSIS — F17200 Nicotine dependence, unspecified, uncomplicated: Secondary | ICD-10-CM | POA: Insufficient documentation

## 2014-06-18 DIAGNOSIS — N898 Other specified noninflammatory disorders of vagina: Secondary | ICD-10-CM | POA: Insufficient documentation

## 2014-06-18 DIAGNOSIS — Z8719 Personal history of other diseases of the digestive system: Secondary | ICD-10-CM | POA: Insufficient documentation

## 2014-06-18 DIAGNOSIS — J45909 Unspecified asthma, uncomplicated: Secondary | ICD-10-CM | POA: Insufficient documentation

## 2014-06-18 DIAGNOSIS — G8929 Other chronic pain: Secondary | ICD-10-CM | POA: Insufficient documentation

## 2014-06-18 DIAGNOSIS — Z3202 Encounter for pregnancy test, result negative: Secondary | ICD-10-CM | POA: Insufficient documentation

## 2014-06-18 LAB — URINALYSIS, ROUTINE W REFLEX MICROSCOPIC
Bilirubin Urine: NEGATIVE
Glucose, UA: NEGATIVE mg/dL
Hgb urine dipstick: NEGATIVE
KETONES UR: 15 mg/dL — AB
Nitrite: NEGATIVE
PROTEIN: NEGATIVE mg/dL
Specific Gravity, Urine: 1.03 (ref 1.005–1.030)
UROBILINOGEN UA: 0.2 mg/dL (ref 0.0–1.0)
pH: 6 (ref 5.0–8.0)

## 2014-06-18 LAB — COMPREHENSIVE METABOLIC PANEL
ALBUMIN: 4.3 g/dL (ref 3.5–5.2)
ALK PHOS: 91 U/L (ref 39–117)
ALT: 28 U/L (ref 0–35)
ANION GAP: 17 — AB (ref 5–15)
AST: 24 U/L (ref 0–37)
BUN: 13 mg/dL (ref 6–23)
CO2: 22 mEq/L (ref 19–32)
Calcium: 9.2 mg/dL (ref 8.4–10.5)
Chloride: 101 mEq/L (ref 96–112)
Creatinine, Ser: 0.9 mg/dL (ref 0.50–1.10)
GFR calc Af Amer: >90 mL/min (ref >90)
GFR calc non Af Amer: 89 mL/min — ABNORMAL LOW (ref >90)
Glucose, Bld: 100 mg/dL — ABNORMAL HIGH (ref 70–99)
Potassium: 3.9 mEq/L (ref 3.7–5.3)
SODIUM: 140 meq/L (ref 137–147)
TOTAL PROTEIN: 7.5 g/dL (ref 6.0–8.3)
Total Bilirubin: 0.7 mg/dL (ref 0.3–1.2)

## 2014-06-18 LAB — URINE MICROSCOPIC-ADD ON

## 2014-06-18 LAB — CBC WITH DIFFERENTIAL/PLATELET
BASOS PCT: 1 % (ref 0–1)
Basophils Absolute: 0.1 10*3/uL (ref 0.0–0.1)
EOS ABS: 0.4 10*3/uL (ref 0.0–0.7)
Eosinophils Relative: 5 % (ref 0–5)
HCT: 39.6 % (ref 36.0–46.0)
Hemoglobin: 13.5 g/dL (ref 12.0–15.0)
Lymphocytes Relative: 31 % (ref 12–46)
Lymphs Abs: 2.3 10*3/uL (ref 0.7–4.0)
MCH: 30.9 pg (ref 26.0–34.0)
MCHC: 34.1 g/dL (ref 30.0–36.0)
MCV: 90.6 fL (ref 78.0–100.0)
Monocytes Absolute: 0.6 10*3/uL (ref 0.1–1.0)
Monocytes Relative: 8 % (ref 3–12)
NEUTROS PCT: 55 % (ref 43–77)
Neutro Abs: 4.2 10*3/uL (ref 1.7–7.7)
PLATELETS: 348 10*3/uL (ref 150–400)
RBC: 4.37 MIL/uL (ref 3.87–5.11)
RDW: 12.9 % (ref 11.5–15.5)
WBC: 7.5 10*3/uL (ref 4.0–10.5)

## 2014-06-18 LAB — WET PREP, GENITAL
Clue Cells Wet Prep HPF POC: NONE SEEN
WBC, Wet Prep HPF POC: NONE SEEN
Yeast Wet Prep HPF POC: NONE SEEN

## 2014-06-18 LAB — PREGNANCY, URINE: Preg Test, Ur: NEGATIVE

## 2014-06-18 LAB — RPR

## 2014-06-18 LAB — LIPASE, BLOOD: LIPASE: 26 U/L (ref 11–59)

## 2014-06-18 MED ORDER — METRONIDAZOLE 500 MG PO TABS
2000.0000 mg | ORAL_TABLET | Freq: Once | ORAL | Status: AC
  Administered 2014-06-18: 2000 mg via ORAL
  Filled 2014-06-18: qty 4

## 2014-06-18 MED ORDER — LIDOCAINE HCL (PF) 1 % IJ SOLN
2.0000 mL | Freq: Once | INTRAMUSCULAR | Status: AC
  Administered 2014-06-18: 2 mL via INTRADERMAL

## 2014-06-18 MED ORDER — CEFTRIAXONE SODIUM 250 MG IJ SOLR
250.0000 mg | Freq: Once | INTRAMUSCULAR | Status: AC
  Administered 2014-06-18: 250 mg via INTRAMUSCULAR
  Filled 2014-06-18: qty 250

## 2014-06-18 MED ORDER — KETOROLAC TROMETHAMINE 60 MG/2ML IM SOLN
60.0000 mg | Freq: Once | INTRAMUSCULAR | Status: AC
  Administered 2014-06-18: 60 mg via INTRAMUSCULAR
  Filled 2014-06-18: qty 2

## 2014-06-18 MED ORDER — AZITHROMYCIN 250 MG PO TABS
1000.0000 mg | ORAL_TABLET | Freq: Once | ORAL | Status: AC
  Administered 2014-06-18: 1000 mg via ORAL
  Filled 2014-06-18: qty 4

## 2014-06-18 NOTE — ED Notes (Addendum)
Pt C/o pain in lower abdomen.  States she thinks she has a STD because her partner has just been diagnosed with a STD.  States she has generalized weakness and 'doesn't feel right'.

## 2014-06-18 NOTE — ED Notes (Signed)
The pt is c/o abd pain  And a headache for 2 weeks.  She is here today because her boyfriend was diagnosed with a std yesterday.  Vaginal ithing but no vaginal discharge.  lmp July 4th

## 2014-06-18 NOTE — Discharge Instructions (Signed)
Return here as needed.  Followup with your Dr. or the health department.  Increase your fluid intake. avoid sexual contact until your symptoms have cleared up

## 2014-06-18 NOTE — ED Provider Notes (Signed)
CSN: [id number]     Arrival date & time 06/18/14  1514 History   First MD Initiated Contact with Patient 06/18/14 1619     Chief Complaint  Patient presents with  . Abdominal Pain     (Consider location/radiation/quality/duration/timing/severity/associated sxs/prior Treatment) HPI Patient presents to the emergency department with vaginal discharge, and discomfort the last few weeks.  The patient, states, that her boyfriend was treated last night for discharge from his penis.  Patient, states, that her last period was July 4.  The patient, states, that nothing seems make her condition, better or worse.  Patient denies nausea, vomiting, weakness, dizziness, back pain, neck pain, dysuria, fever, cough, or syncope.  The patient, states she did not take any medications prior to arrival Past Medical History  Diagnosis Date  . Asthma   . Seasonal allergies   . GERD (gastroesophageal reflux disease)   . Chronic back pain   . Nausea and vomiting     recurrent  . Recurrent abdominal pain    Past Surgical History  Procedure Laterality Date  . Tonsillectomy     No family history on file. History  Substance Use Topics  . Smoking status: Current Some Day Smoker -- 0.50 packs/day    Types: Cigarettes  . Smokeless tobacco: Not on file  . Alcohol Use: No   OB History   Grav Para Term Preterm Abortions TAB SAB Ect Mult Living   1              Review of Systems All other systems negative except as documented in the HPI. All pertinent positives and negatives as reviewed in the HPI.   Allergies  Hydrocodone  Home Medications   Prior to Admission medications   Medication Sig Start Date End Date Taking? Authorizing Provider  calcium carbonate (TUMS - DOSED IN MG ELEMENTAL CALCIUM) 500 MG chewable tablet Chew 3-4 tablets by mouth every 4 (four) hours as needed for indigestion or heartburn.    Yes Historical Provider, MD   BP 109/85  Pulse 61  Temp(Src) 99.1 F (37.3 C) (Oral)  Resp 20   SpO2 97%  LMP 06/05/2014 Physical Exam  Nursing note and vitals reviewed. Constitutional: She is oriented to person, place, and time. She appears well-developed and well-nourished. No distress.  HENT:  Head: Normocephalic and atraumatic.  Eyes: Pupils are equal, round, and reactive to light.  Neck: Normal range of motion. Neck supple.  Cardiovascular: Normal rate, regular rhythm and normal heart sounds.  Exam reveals no gallop and no friction rub.   No murmur heard. Pulmonary/Chest: Effort normal and breath sounds normal. No respiratory distress.  Genitourinary: Vaginal discharge found.  Neurological: She is alert and oriented to person, place, and time.  Skin: Skin is warm and dry. No rash noted. No erythema.    ED Course  Procedures (including critical care time) Labs Review Labs Reviewed  WET PREP, GENITAL - Abnormal; Notable for the following:    Trich, Wet Prep FEW (*)    All other components within normal limits  COMPREHENSIVE METABOLIC PANEL - Abnormal; Notable for the following:    Glucose, Bld 100 (*)    GFR calc non Af Amer 89 (*)    Anion gap 17 (*)    All other components within normal limits  URINALYSIS, ROUTINE W REFLEX MICROSCOPIC - Abnormal; Notable for the following:    Color, Urine AMBER (*)    APPearance CLOUDY (*)    Ketones, ur 15 (*)  Leukocytes, UA TRACE (*)    All other components within normal limits  URINE MICROSCOPIC-ADD ON - Abnormal; Notable for the following:    Squamous Epithelial / LPF MANY (*)    Bacteria, UA FEW (*)    All other components within normal limits  GC/CHLAMYDIA PROBE AMP  CBC WITH DIFFERENTIAL  LIPASE, BLOOD  PREGNANCY, URINE  RPR   Patient be treated for STDs.  Told to return here as needed.  Advised her to avoid unprotected sexual contact until her symptoms had cleared   Carlyle DollyChristopher W Buelah Rennie, PA-C 06/18/14 1849

## 2014-06-19 LAB — GC/CHLAMYDIA PROBE AMP
CT Probe RNA: NEGATIVE
GC Probe RNA: NEGATIVE

## 2014-06-19 NOTE — ED Provider Notes (Signed)
Medical screening examination/treatment/procedure(s) were performed by non-physician practitioner and as supervising physician I was immediately available for consultation/collaboration.  Ambre Kobayashi L Hien Perreira, MD 06/19/14 0122 

## 2014-06-24 ENCOUNTER — Telehealth (HOSPITAL_BASED_OUTPATIENT_CLINIC_OR_DEPARTMENT_OTHER): Payer: Self-pay

## 2014-10-04 ENCOUNTER — Encounter (HOSPITAL_COMMUNITY): Payer: Self-pay | Admitting: Emergency Medicine

## 2015-07-18 IMAGING — CR DG CHEST 2V
2 series · 2 of 2 positions shown · non-contrast
Comparison: Prior chest x-ray 05/28/2012

CLINICAL DATA: Cough, chest pain

EXAM:
CHEST  2 VIEW

[w chest pa]
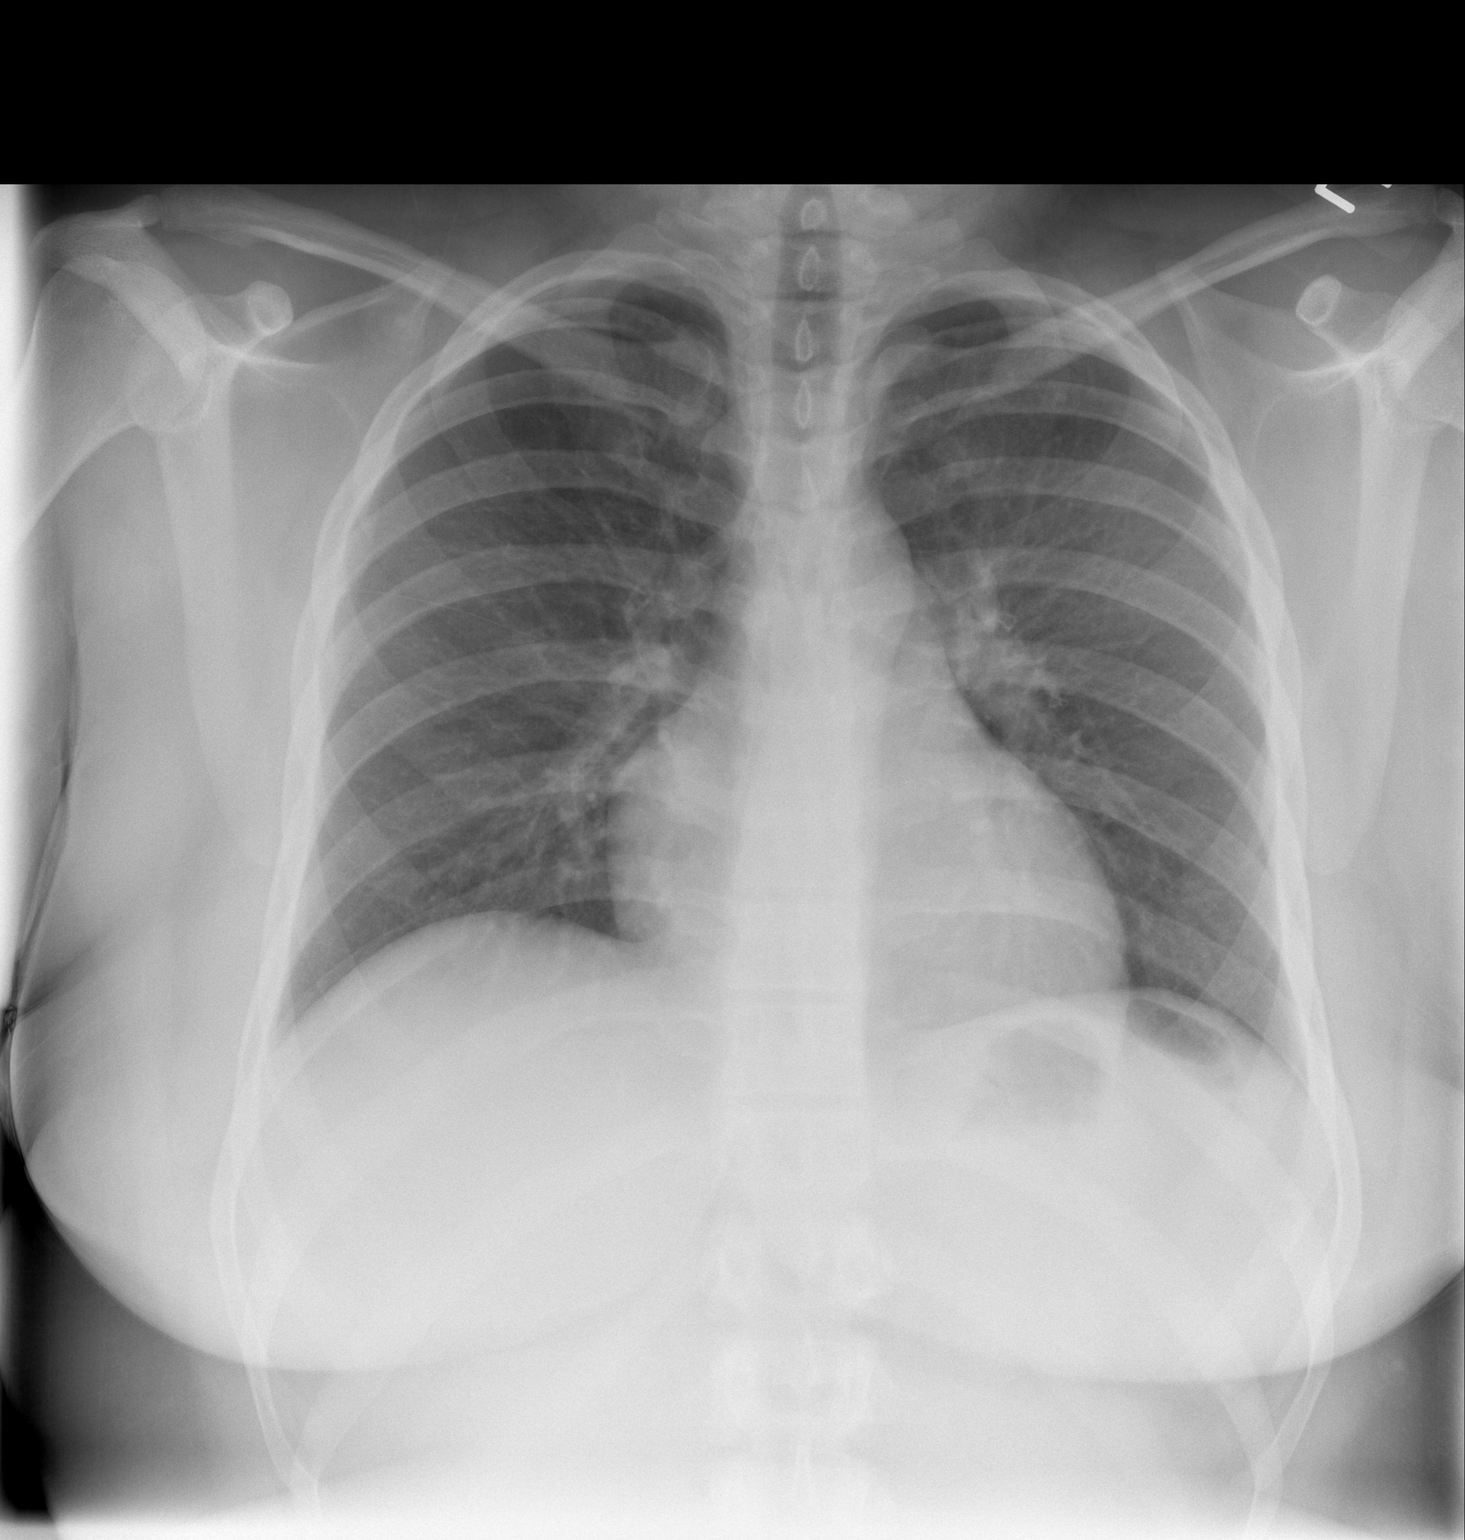

[w chest lat]
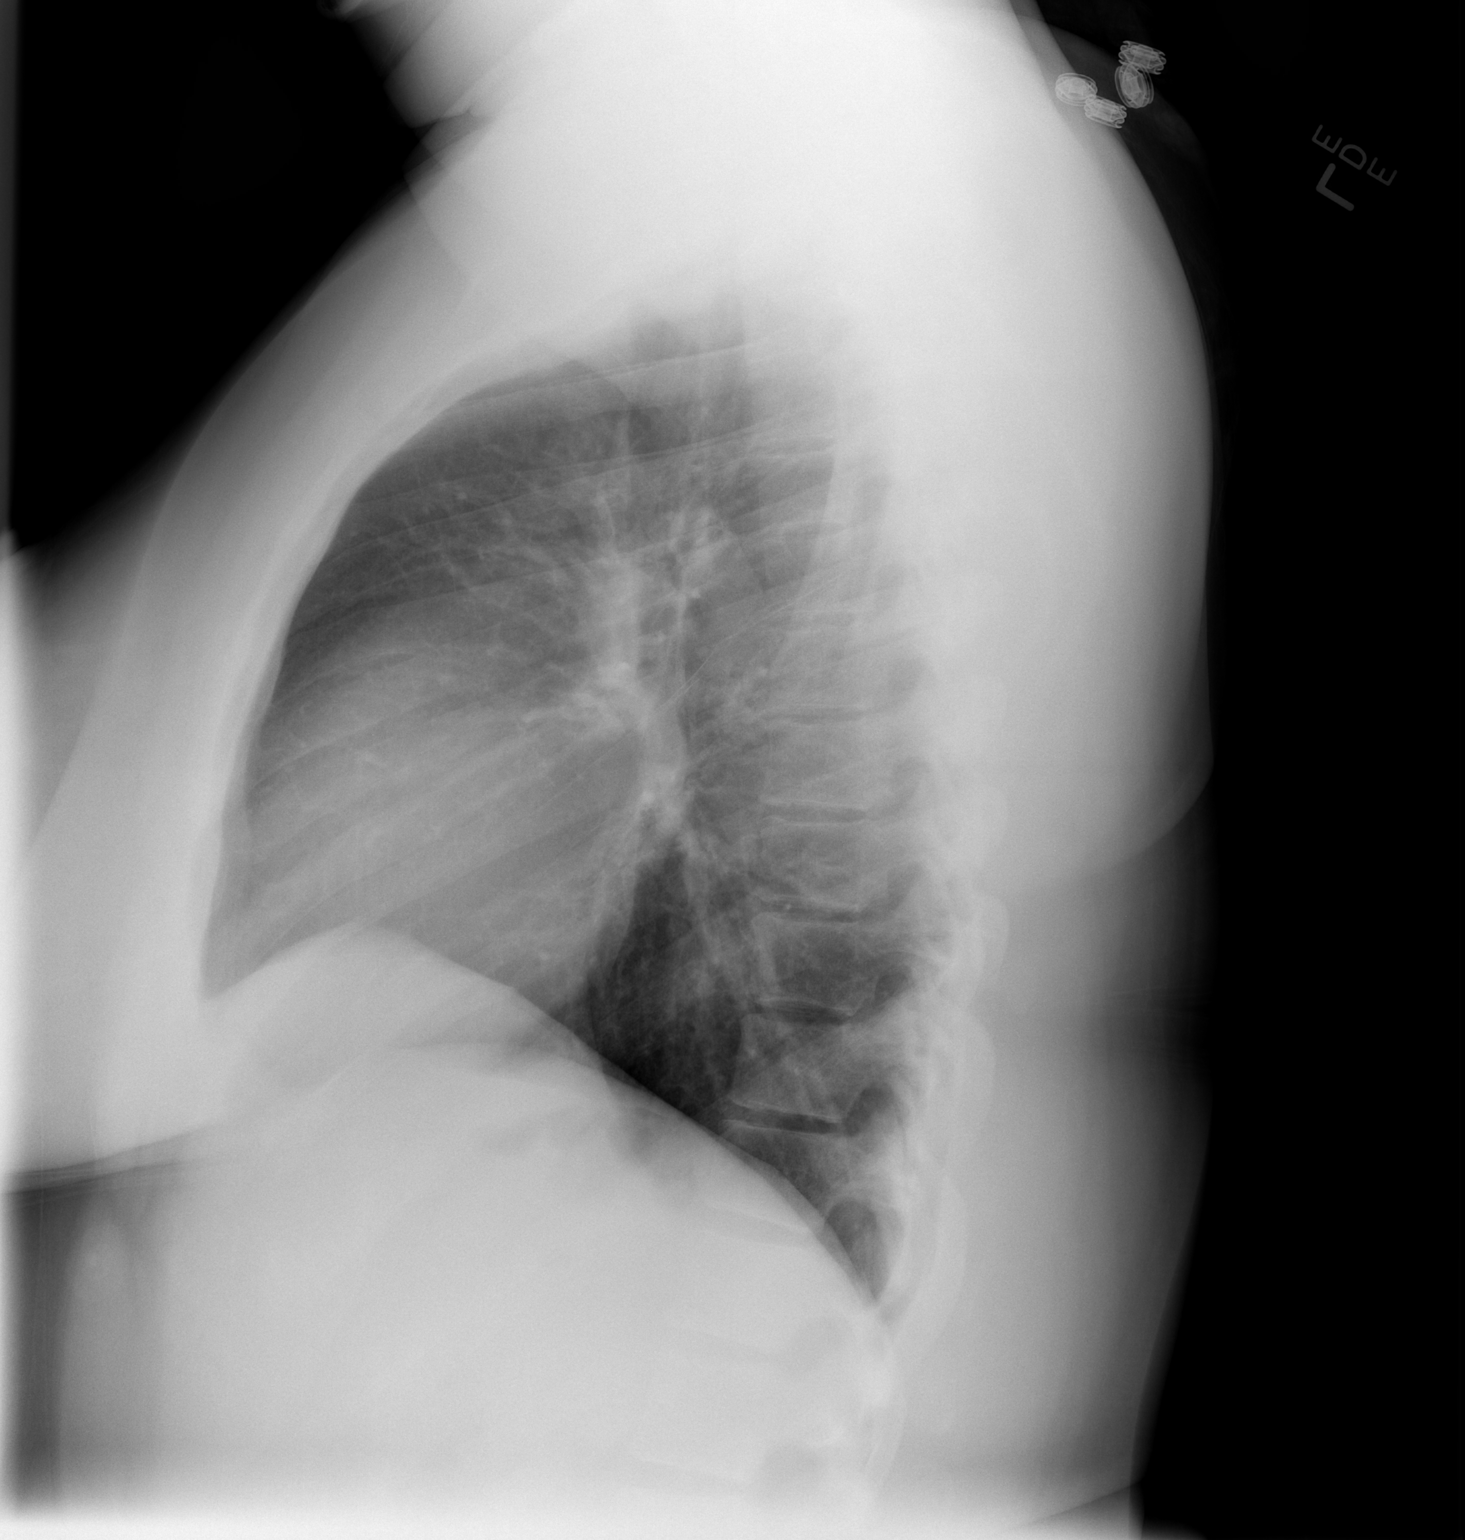

[2 of 2 positions shown; findings below may reference images not displayed]

FINDINGS: The lungs are clear and negative for focal airspace consolidation,
pulmonary edema or suspicious pulmonary nodule. No pleural effusion
or pneumothorax. Cardiac and mediastinal contours are within normal
limits. No acute fracture or lytic or blastic osseous lesions. The
visualized upper abdominal bowel gas pattern is unremarkable.
IMPRESSION: No active cardiopulmonary disease.

## 2015-08-01 IMAGING — CR DG ABDOMEN ACUTE W/ 1V CHEST
3 series · 3 of 3 positions shown · non-contrast
Comparison: 08/28/2009, 10/12/2013

CLINICAL DATA: Abdominal pain, nausea, vomiting, diarrhea

EXAM:
ACUTE ABDOMEN SERIES (ABDOMEN 2 VIEW & CHEST 1 VIEW)

[w chest pa]
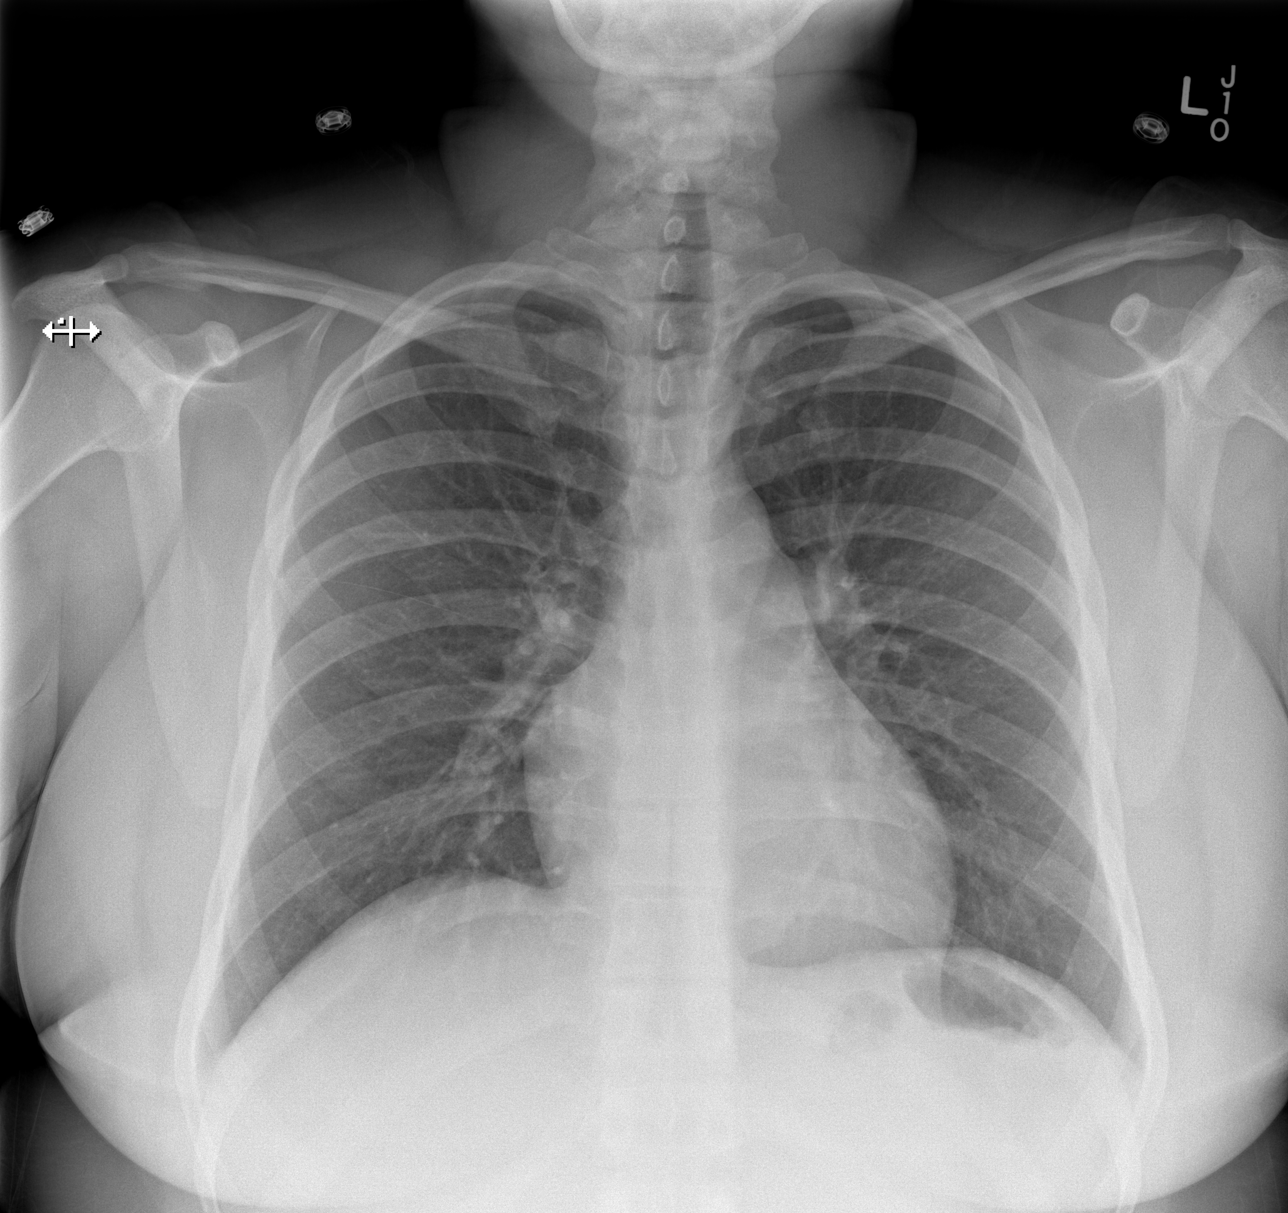

[w abdomen upright]
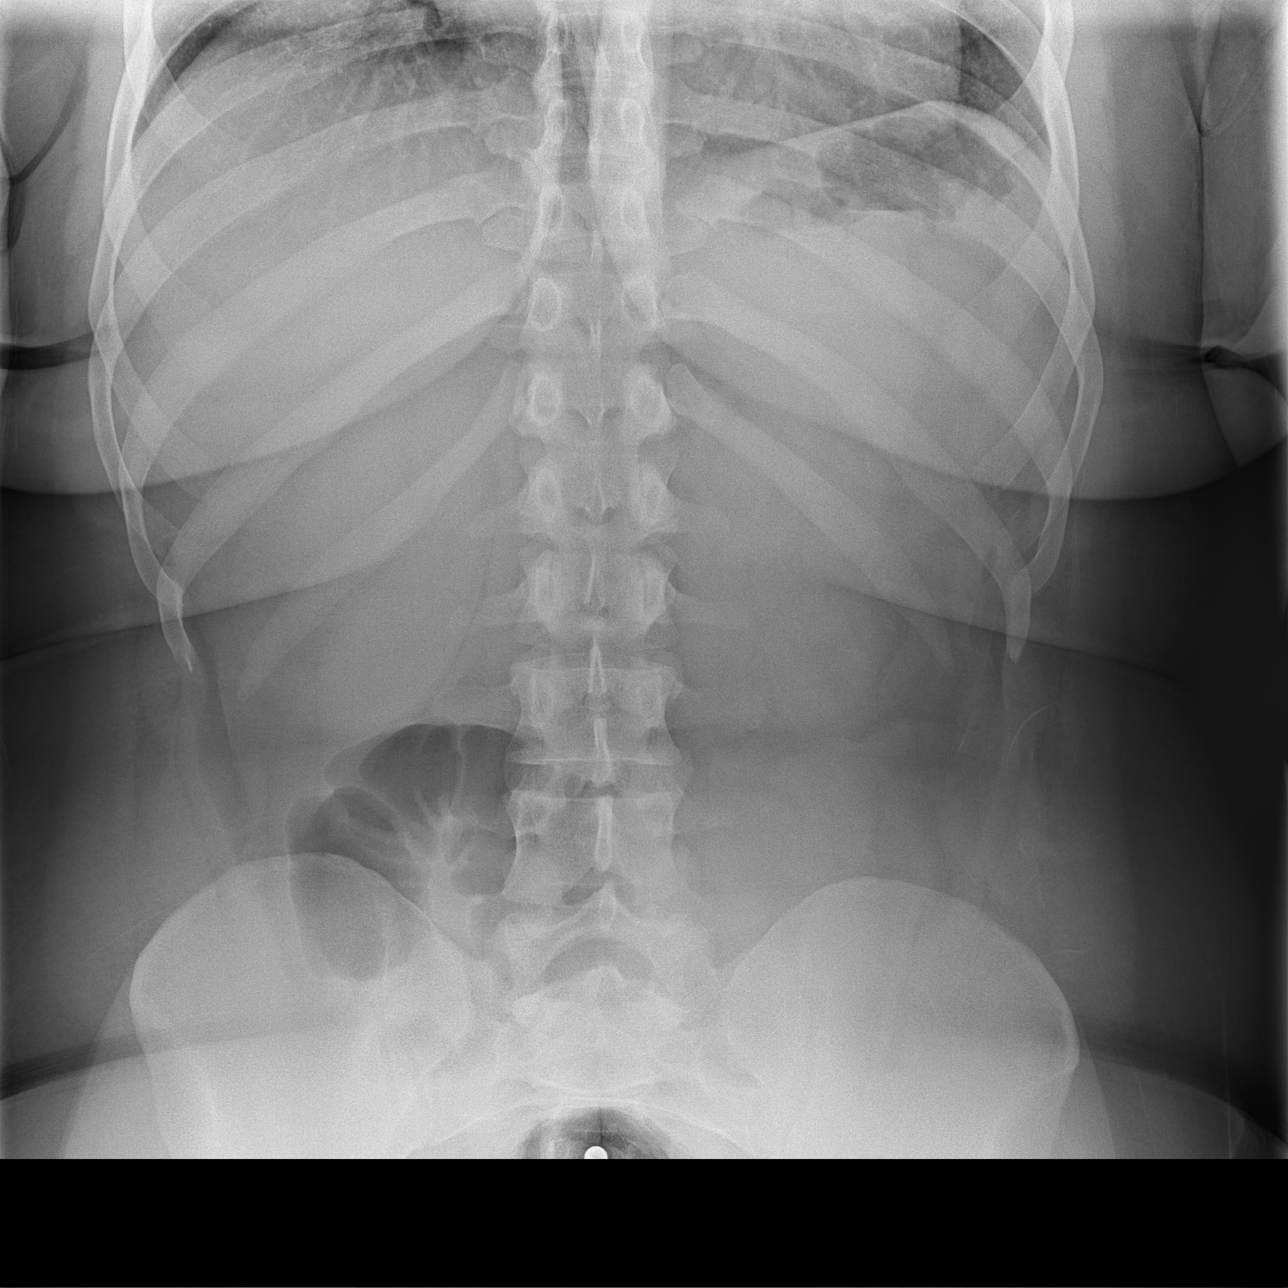

[t abdomen supine]
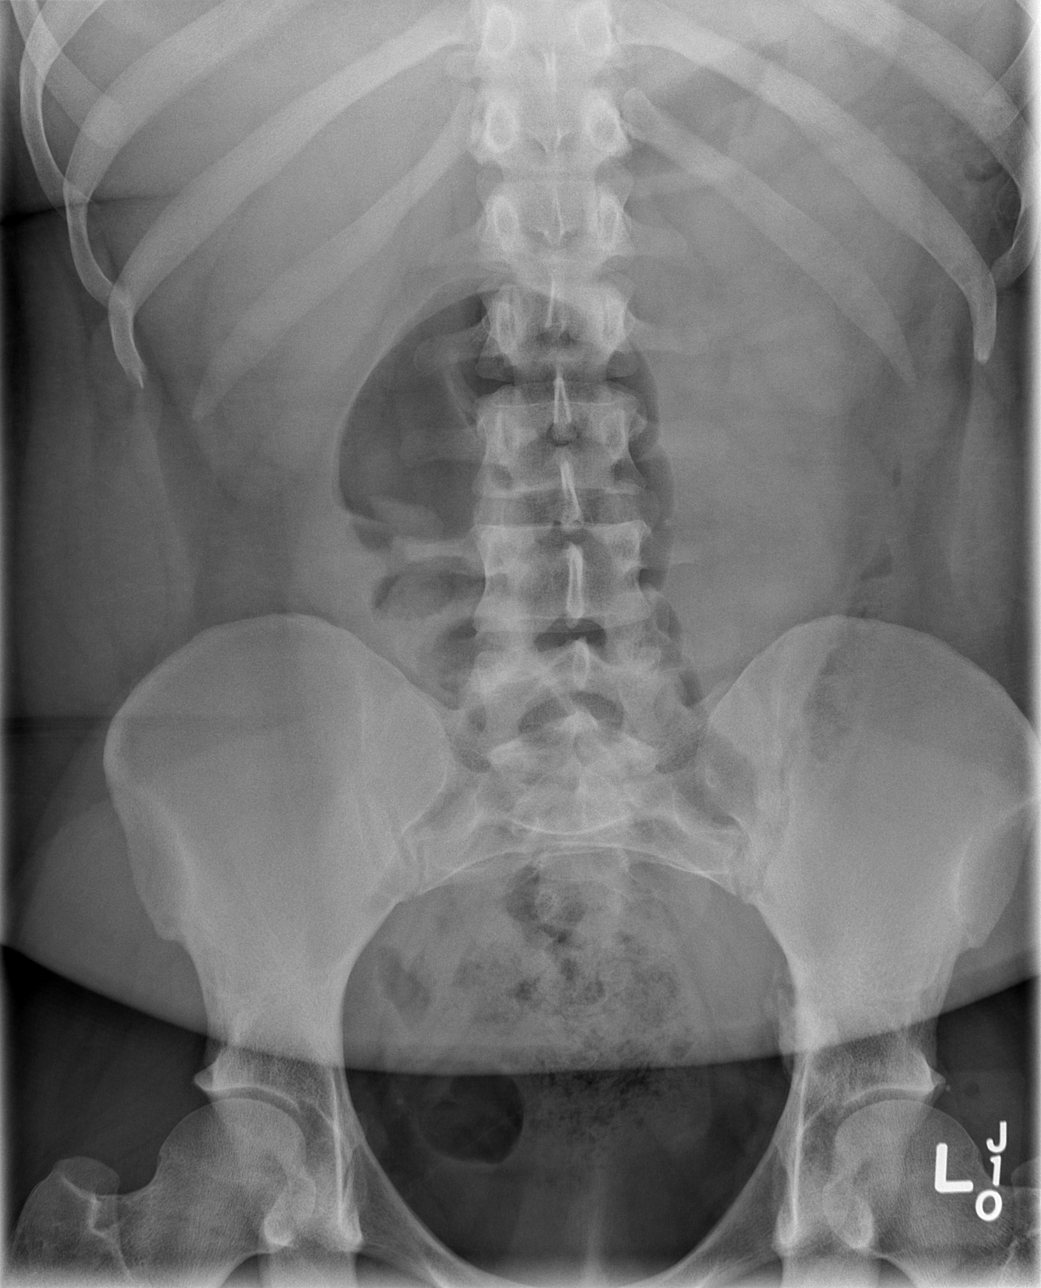

[3 of 3 positions shown; findings below may reference images not displayed]

FINDINGS: Normal heart size and vascularity. Lungs clear. No acute airspace
process, collapse, consolidation, effusion or pneumothorax. Trachea
midline.

Negative for free air.

Nonspecific gaseous distention of the colon in the lower abdomen and
pelvis. Stool noted in the rectum. Negative for significant
obstruction or ileus. No abnormal calcification or osseous
abnormality.
IMPRESSION: No acute finding in the chest or abdomen.

Negative for significant obstruction, ileus, or free air

## 2019-04-21 ENCOUNTER — Emergency Department (HOSPITAL_COMMUNITY): Payer: Medicaid Other

## 2019-04-21 ENCOUNTER — Other Ambulatory Visit: Payer: Self-pay

## 2019-04-21 ENCOUNTER — Encounter (HOSPITAL_COMMUNITY): Payer: Self-pay | Admitting: Emergency Medicine

## 2019-04-21 ENCOUNTER — Emergency Department (HOSPITAL_COMMUNITY)
Admission: EM | Admit: 2019-04-21 | Discharge: 2019-04-22 | Disposition: A | Discharge status: Home / Self Care | Payer: Medicaid Other | Attending: Emergency Medicine | Admitting: Emergency Medicine

## 2019-04-21 DIAGNOSIS — R350 Frequency of micturition: Secondary | ICD-10-CM | POA: Diagnosis not present

## 2019-04-21 DIAGNOSIS — R739 Hyperglycemia, unspecified: Secondary | ICD-10-CM

## 2019-04-21 DIAGNOSIS — M25462 Effusion, left knee: Secondary | ICD-10-CM | POA: Diagnosis not present

## 2019-04-21 DIAGNOSIS — F17210 Nicotine dependence, cigarettes, uncomplicated: Secondary | ICD-10-CM | POA: Insufficient documentation

## 2019-04-21 DIAGNOSIS — J45909 Unspecified asthma, uncomplicated: Secondary | ICD-10-CM | POA: Diagnosis not present

## 2019-04-21 DIAGNOSIS — F1210 Cannabis abuse, uncomplicated: Secondary | ICD-10-CM | POA: Diagnosis not present

## 2019-04-21 DIAGNOSIS — M25562 Pain in left knee: Secondary | ICD-10-CM | POA: Diagnosis present

## 2019-04-21 LAB — CBC WITH DIFFERENTIAL/PLATELET
Abs Immature Granulocytes: 0.06 10*3/uL (ref 0.00–0.07)
Basophils Absolute: 0.1 10*3/uL (ref 0.0–0.1)
Basophils Relative: 1 %
Eosinophils Absolute: 0.2 10*3/uL (ref 0.0–0.5)
Eosinophils Relative: 3 %
HCT: 41.9 % (ref 36.0–46.0)
Hemoglobin: 13.8 g/dL (ref 12.0–15.0)
Immature Granulocytes: 1 %
Lymphocytes Relative: 33 %
Lymphs Abs: 2.4 10*3/uL (ref 0.7–4.0)
MCH: 30.7 pg (ref 26.0–34.0)
MCHC: 32.9 g/dL (ref 30.0–36.0)
MCV: 93.3 fL (ref 80.0–100.0)
Monocytes Absolute: 0.5 10*3/uL (ref 0.1–1.0)
Monocytes Relative: 7 %
Neutro Abs: 4.1 10*3/uL (ref 1.7–7.7)
Neutrophils Relative %: 55 %
Platelets: 311 10*3/uL (ref 150–400)
RBC: 4.49 MIL/uL (ref 3.87–5.11)
RDW: 13.2 % (ref 11.5–15.5)
WBC: 7.2 10*3/uL (ref 4.0–10.5)
nRBC: 0 % (ref 0.0–0.2)

## 2019-04-21 LAB — BASIC METABOLIC PANEL
Anion gap: 11 (ref 5–15)
BUN: 15 mg/dL (ref 6–20)
CO2: 24 mmol/L (ref 22–32)
Calcium: 9.3 mg/dL (ref 8.9–10.3)
Chloride: 98 mmol/L (ref 98–111)
Creatinine, Ser: 0.93 mg/dL (ref 0.44–1.00)
GFR calc Af Amer: >60 mL/min (ref >60)
GFR calc non Af Amer: >60 mL/min (ref >60)
Glucose, Bld: 450 mg/dL — ABNORMAL HIGH (ref 70–99)
Potassium: 4.1 mmol/L (ref 3.5–5.1)
Sodium: 133 mmol/L — ABNORMAL LOW (ref 135–145)

## 2019-04-21 LAB — URINALYSIS, ROUTINE W REFLEX MICROSCOPIC
Bilirubin Urine: NEGATIVE
Glucose, UA: >=500 mg/dL — AB
Ketones, ur: NEGATIVE mg/dL
Leukocytes,Ua: NEGATIVE
Nitrite: NEGATIVE
Protein, ur: NEGATIVE mg/dL
Specific Gravity, Urine: 1.036 — ABNORMAL HIGH (ref 1.005–1.030)
pH: 6 (ref 5.0–8.0)

## 2019-04-21 LAB — CBG MONITORING, ED
Glucose-Capillary: 458 mg/dL — ABNORMAL HIGH (ref 70–99)
Glucose-Capillary: 352 mg/dL — ABNORMAL HIGH (ref 70–99)

## 2019-04-21 MED ORDER — INSULIN ASPART 100 UNIT/ML ~~LOC~~ SOLN
10.0000 [IU] | Freq: Once | SUBCUTANEOUS | Status: AC
  Administered 2019-04-21: 23:00:00 10 [IU] via SUBCUTANEOUS
  Filled 2019-04-21: qty 1

## 2019-04-21 MED ORDER — SODIUM CHLORIDE 0.9 % IV SOLN
1000.0000 mL | INTRAVENOUS | Status: DC
Start: 2019-04-21 — End: 2019-04-22

## 2019-04-21 MED ORDER — METFORMIN HCL 500 MG PO TABS: 500.0000 mg | ORAL_TABLET | Freq: Two times a day (BID) | ORAL | 0 refills | Status: DC

## 2019-04-21 MED ORDER — SODIUM CHLORIDE 0.9 % IV BOLUS (SEPSIS)
1000.0000 mL | Freq: Once | INTRAVENOUS | Status: AC
  Administered 2019-04-21: 22:00:00 1000 mL via INTRAVENOUS

## 2019-04-21 MED ORDER — SODIUM CHLORIDE 0.9 % IV BOLUS (SEPSIS)
1000.0000 mL | Freq: Once | INTRAVENOUS | Status: AC
Start: 2019-04-21 — End: 2019-04-22
  Administered 2019-04-21: 22:00:00 1000 mL via INTRAVENOUS

## 2019-04-21 NOTE — ED Notes (Signed)
Gave pt ice water °

## 2019-04-21 NOTE — ED Triage Notes (Signed)
Pt states having left knee pain since Friday. Fell on her left knee

## 2019-04-21 NOTE — Discharge Instructions (Addendum)
Schedule appointment?

## 2019-04-21 NOTE — ED Provider Notes (Signed)
[facility] EMERGENCY DEPARTMENT Provider Note   CSN: [id number] Arrival date & time: 04/21/19  1750    History   Chief Complaint Chief Complaint  Patient presents with  . Knee Injury    HPI Danielle Zimmerman is a 28 y.o. female.     The history is provided by the patient. No language interpreter was used.  Knee Pain  Location:  Knee Time since incident:  4 days Knee location:  R knee Pain details:    Quality:  Aching   Radiates to:  Does not radiate   Severity:  Moderate   Onset quality:  Gradual   Timing:  Constant   Progression:  Worsening Chronicity:  New Dislocation: no   Relieved by:  Nothing Worsened by:  Nothing Ineffective treatments:  None tried Risk factors: concern for non-accidental trauma    Pt reports she fell and hit her knee.  Pt complains of pain. Pt also reports she has been urinating frequently.  Pt states she urinates about 30 times a day Past Medical History:  Diagnosis Date  . Asthma   . Chronic back pain   . GERD (gastroesophageal reflux disease)   . Nausea and vomiting    recurrent  . Recurrent abdominal pain   . Seasonal allergies     There are no active problems to display for this patient.   Past Surgical History:  Procedure Laterality Date  . TONSILLECTOMY       OB History    Gravida  1   Para      Term      Preterm      AB      Living        SAB      TAB      Ectopic      Multiple      Live Births               Home Medications    Prior to Admission medications   Medication Sig Start Date End Date Taking? Authorizing Provider  calcium carbonate (TUMS - DOSED IN MG ELEMENTAL CALCIUM) 500 MG chewable tablet Chew 3-4 tablets by mouth every 4 (four) hours as needed for indigestion or heartburn.     [provider]    Family History No family history on file.  Social History Social History   Tobacco Use  . Smoking status: Current Some Day Smoker    Packs/day: 0.25    Types:  Cigarettes  . Smokeless tobacco: Never Used  Substance Use Topics  . Alcohol use: No  . Drug use: Yes    Types: Marijuana     Allergies   Hydrocodone   Review of Systems Review of Systems  Genitourinary: Positive for frequency.  Musculoskeletal: Positive for joint swelling.  All other systems reviewed and are negative.    Physical Exam Updated Vital Signs Ht 5\' 8"  (1.727 m)   Wt (!) 147.9 kg   LMP 04/17/2019   BMI 49.57 kg/m   Physical Exam Vitals signs and nursing note reviewed.  Constitutional:      Appearance: She is well-developed.  HENT:     Head: Normocephalic.     Nose: Nose normal.     Mouth/Throat:     Mouth: Mucous membranes are moist.  Eyes:     Pupils: Pupils are equal, round, and reactive to light.  Neck:     Musculoskeletal: Normal range of motion.  Cardiovascular:     Rate and  Rhythm: Normal rate.  Pulmonary:     Effort: Pulmonary effort is normal.  Abdominal:     General: There is no distension.  Musculoskeletal: Normal range of motion.        General: Swelling present.     Comments: Slight swelling left knee, small effusion,  No medial or lateral instability,  Negative drawer.  nv and ns intact   Neurological:     Mental Status: She is alert and oriented to person, place, and time.  Psychiatric:        Mood and Affect: Mood normal.      ED Treatments / Results  Labs (all labs ordered are listed, but only abnormal results are displayed) Labs Reviewed  URINALYSIS, ROUTINE W REFLEX MICROSCOPIC - Abnormal; Notable for the following components:      Result Value   Color, Urine STRAW (*)    Specific Gravity, Urine 1.036 (*)    Glucose, UA >=500 (*)    Hgb urine dipstick LARGE (*)    Bacteria, UA RARE (*)    All other components within normal limits    EKG None  Radiology Dg Knee Complete 4 Views Left  Result Date: 04/21/2019 CLINICAL DATA:  Left knee injury EXAM: LEFT KNEE - COMPLETE 4+ VIEW COMPARISON:  10/24/2012. FINDINGS:  No evidence of fracture, dislocation, or joint effusion. No evidence of arthropathy or other focal bone abnormality. Soft tissues are unremarkable. IMPRESSION: Negative. Electronically Signed   By: Katherine Mantlehristopher  Green M.D.   On: 04/21/2019 19:05    Procedures Procedures (including critical care time)  Medications Ordered in ED Medications - No data to display   Initial Impression / Assessment and Plan / ED Course  I have reviewed the triage vital signs and the nursing notes.  Pertinent labs & imaging results that were available during my care of the patient were reviewed by me and considered in my medical decision making (see chart for details).        MDM  Pt counseled on diabetes,  Pt given Iv fluids and insulin. Pt counseled on diabetes.  Pt placed in a knee brace.  Pt given rx for metformin.  Rx for glucometer test strips,  Pt advised to establish care   Final Clinical Impressions(s) / ED Diagnoses   Final diagnoses:  Effusion of left knee  Hyperglycemia    ED Discharge Orders         Ordered    metFORMIN (GLUCOPHAGE) 500 MG tablet  2 times daily with meals     04/21/19 2346    POCT Glucose (Device for Home Use)    Comments:  Please provide with glucometer, alcohol swabs, lancets and test strips   04/21/19 2351           Elson AreasSofia, Chimene Salo K, [place] JerseyPA-C 04/21/19 2354    Mancel BaleWentz, Elliott, MD 04/22/19 [phone]

## 2019-04-22 NOTE — ED Notes (Signed)
approx 120 ml remain in each bag of NS-will continue infusing until complete- then discharge pt- pt made aware of this.

## 2019-04-22 NOTE — ED Notes (Addendum)
[name], PA gave verbal order to let IV fluid boluses complete before discharging pt.

## 2019-05-04 DIAGNOSIS — E119 Type 2 diabetes mellitus without complications: Secondary | ICD-10-CM

## 2019-05-04 HISTORY — DX: Type 2 diabetes mellitus without complications: E11.9

## 2019-06-15 ENCOUNTER — Emergency Department (HOSPITAL_COMMUNITY)
Admission: EM | Admit: 2019-06-15 | Discharge: 2019-06-15 | Disposition: A | Discharge status: Home / Self Care | Payer: Medicaid Other | Attending: Emergency Medicine | Admitting: Emergency Medicine

## 2019-06-15 ENCOUNTER — Encounter (HOSPITAL_COMMUNITY): Payer: Self-pay | Admitting: Emergency Medicine

## 2019-06-15 ENCOUNTER — Emergency Department (HOSPITAL_COMMUNITY): Payer: Medicaid Other

## 2019-06-15 ENCOUNTER — Other Ambulatory Visit: Payer: Self-pay

## 2019-06-15 DIAGNOSIS — J45909 Unspecified asthma, uncomplicated: Secondary | ICD-10-CM | POA: Insufficient documentation

## 2019-06-15 DIAGNOSIS — Z79899 Other long term (current) drug therapy: Secondary | ICD-10-CM | POA: Diagnosis not present

## 2019-06-15 DIAGNOSIS — Z885 Allergy status to narcotic agent status: Secondary | ICD-10-CM | POA: Insufficient documentation

## 2019-06-15 DIAGNOSIS — N3001 Acute cystitis with hematuria: Secondary | ICD-10-CM | POA: Diagnosis not present

## 2019-06-15 DIAGNOSIS — R1030 Lower abdominal pain, unspecified: Secondary | ICD-10-CM | POA: Diagnosis present

## 2019-06-15 DIAGNOSIS — F17210 Nicotine dependence, cigarettes, uncomplicated: Secondary | ICD-10-CM | POA: Diagnosis not present

## 2019-06-15 HISTORY — DX: Calculus of kidney: N20.0

## 2019-06-15 LAB — URINALYSIS, ROUTINE W REFLEX MICROSCOPIC
Bilirubin Urine: NEGATIVE
Glucose, UA: >=500 mg/dL — AB
Ketones, ur: NEGATIVE mg/dL
Nitrite: POSITIVE — AB
Protein, ur: 100 mg/dL — AB
Specific Gravity, Urine: 1.033 — ABNORMAL HIGH (ref 1.005–1.030)
WBC, UA: >50 WBC/hpf — ABNORMAL HIGH (ref 0–5)
pH: 6 (ref 5.0–8.0)

## 2019-06-15 LAB — COMPREHENSIVE METABOLIC PANEL
ALT: 221 U/L — ABNORMAL HIGH (ref 0–44)
AST: 112 U/L — ABNORMAL HIGH (ref 15–41)
Albumin: 3.8 g/dL (ref 3.5–5.0)
Alkaline Phosphatase: 88 U/L (ref 38–126)
Anion gap: 9 (ref 5–15)
BUN: 7 mg/dL (ref 6–20)
CO2: 21 mmol/L — ABNORMAL LOW (ref 22–32)
Calcium: 8.5 mg/dL — ABNORMAL LOW (ref 8.9–10.3)
Chloride: 101 mmol/L (ref 98–111)
Creatinine, Ser: 0.84 mg/dL (ref 0.44–1.00)
GFR calc Af Amer: >60 mL/min (ref >60)
GFR calc non Af Amer: >60 mL/min (ref >60)
Glucose, Bld: 336 mg/dL — ABNORMAL HIGH (ref 70–99)
Potassium: 3.4 mmol/L — ABNORMAL LOW (ref 3.5–5.1)
Sodium: 131 mmol/L — ABNORMAL LOW (ref 135–145)
Total Bilirubin: 1.4 mg/dL — ABNORMAL HIGH (ref 0.3–1.2)
Total Protein: 7.4 g/dL (ref 6.5–8.1)

## 2019-06-15 LAB — CBC WITH DIFFERENTIAL/PLATELET
Abs Immature Granulocytes: 0.06 10*3/uL (ref 0.00–0.07)
Basophils Absolute: 0.1 10*3/uL (ref 0.0–0.1)
Basophils Relative: 1 %
Eosinophils Absolute: 0.8 10*3/uL — ABNORMAL HIGH (ref 0.0–0.5)
Eosinophils Relative: 7 %
HCT: 42.3 % (ref 36.0–46.0)
Hemoglobin: 14 g/dL (ref 12.0–15.0)
Immature Granulocytes: 1 %
Lymphocytes Relative: 23 %
Lymphs Abs: 2.6 10*3/uL (ref 0.7–4.0)
MCH: 30.2 pg (ref 26.0–34.0)
MCHC: 33.1 g/dL (ref 30.0–36.0)
MCV: 91.2 fL (ref 80.0–100.0)
Monocytes Absolute: 0.7 10*3/uL (ref 0.1–1.0)
Monocytes Relative: 7 %
Neutro Abs: 7 10*3/uL (ref 1.7–7.7)
Neutrophils Relative %: 61 %
Platelets: 369 10*3/uL (ref 150–400)
RBC: 4.64 MIL/uL (ref 3.87–5.11)
RDW: 12.5 % (ref 11.5–15.5)
WBC: 11.3 10*3/uL — ABNORMAL HIGH (ref 4.0–10.5)
nRBC: 0 % (ref 0.0–0.2)

## 2019-06-15 LAB — I-STAT BETA HCG BLOOD, ED (MC, WL, AP ONLY): I-stat hCG, quantitative: <5 m[IU]/mL (ref <5)

## 2019-06-15 MED ORDER — IBUPROFEN 800 MG PO TABS: 800.0000 mg | ORAL_TABLET | Freq: Three times a day (TID) | ORAL | 0 refills | Status: DC | PRN

## 2019-06-15 MED ORDER — CEPHALEXIN 500 MG PO CAPS
500.0000 mg | ORAL_CAPSULE | Freq: Once | ORAL | Status: AC
  Administered 2019-06-15: 500 mg via ORAL
  Filled 2019-06-15: qty 1

## 2019-06-15 MED ORDER — CEPHALEXIN 500 MG PO CAPS: 500.0000 mg | ORAL_CAPSULE | Freq: Four times a day (QID) | ORAL | 0 refills | Status: DC

## 2019-06-15 MED ORDER — ONDANSETRON HCL 4 MG/2ML IJ SOLN
4.0000 mg | Freq: Once | INTRAMUSCULAR | Status: AC
  Administered 2019-06-15: 4 mg via INTRAVENOUS
  Filled 2019-06-15: qty 2

## 2019-06-15 MED ORDER — HYDROMORPHONE HCL 1 MG/ML IJ SOLN
0.5000 mg | Freq: Once | INTRAMUSCULAR | Status: AC
  Administered 2019-06-15: 17:00:00 0.5 mg via INTRAVENOUS
  Filled 2019-06-15: qty 1

## 2019-06-15 NOTE — ED Triage Notes (Signed)
Pt states that she has been passing a kidney stone for 3 days but she is having bad pain on her left side

## 2019-06-15 NOTE — ED Provider Notes (Signed)
[facility] EMERGENCY DEPARTMENT Provider Note   CSN: [id number] Arrival date & time: 06/15/19  1517     History   Chief Complaint Chief Complaint  Patient presents with  . Flank Pain    HPI Danielle Zimmerman is a 28 y.o. female.     Patient complains of flank pain.  No fever no chills  The history is provided by the patient. No language interpreter was used.  Flank Pain This is a new problem. The current episode started 6 to 12 hours ago. The problem has not changed since onset.Pertinent negatives include no chest pain, no abdominal pain and no headaches. Nothing relieves the symptoms. She has tried nothing for the symptoms.    Past Medical History:  Diagnosis Date  . Asthma   . Chronic back pain   . GERD (gastroesophageal reflux disease)   . Kidney stone   . Nausea and vomiting    recurrent  . Recurrent abdominal pain   . Seasonal allergies     There are no active problems to display for this patient.   Past Surgical History:  Procedure Laterality Date  . TONSILLECTOMY       OB History    Gravida  1   Para      Term      Preterm      AB      Living        SAB      TAB      Ectopic      Multiple      Live Births               Home Medications    Prior to Admission medications   Medication Sig Start Date End Date Taking? Authorizing Provider  atorvastatin (LIPITOR) 20 MG tablet Take 20 mg by mouth daily.  06/07/19  Yes [provider]  LANTUS SOLOSTAR 100 UNIT/ML Solostar Pen Inject 40 Units into the skin at bedtime.  05/19/19  Yes [provider]  losartan-hydrochlorothiazide (HYZAAR) 50-12.5 MG tablet Take 1 tablet by mouth daily.  06/07/19  Yes [provider]  cephALEXin (KEFLEX) 500 MG capsule Take 1 capsule (500 mg total) by mouth 4 (four) times daily. 06/15/19   Bethann BerkshireZammit, Kinsley Nicklaus, MD  ibuprofen (ADVIL) 800 MG tablet Take 1 tablet (800 mg total) by mouth every 8 (eight) hours as needed for moderate pain.  06/15/19   Bethann BerkshireZammit, Camaya Gannett, MD  metFORMIN (GLUCOPHAGE) 500 MG tablet Take 1 tablet (500 mg total) by mouth 2 (two) times daily with a meal for 30 days. Patient not taking: Reported on 06/15/2019 04/21/19 05/21/19  Elson AreasSofia, Leslie K, PA-C    Family History No family history on file.  Social History Social History   Tobacco Use  . Smoking status: Current Some Day Smoker    Packs/day: 0.25    Types: Cigarettes  . Smokeless tobacco: Never Used  Substance Use Topics  . Alcohol use: No  . Drug use: Yes    Types: Marijuana     Allergies   Hydrocodone   Review of Systems Review of Systems  Constitutional: Negative for appetite change and fatigue.  HENT: Negative for congestion, ear discharge and sinus pressure.   Eyes: Negative for discharge.  Respiratory: Negative for cough.   Cardiovascular: Negative for chest pain.  Gastrointestinal: Negative for abdominal pain and diarrhea.  Genitourinary: Positive for flank pain. Negative for frequency and hematuria.  Musculoskeletal: Negative for back pain.  Skin: Negative for rash.  Neurological: Negative for seizures and headaches.  Psychiatric/Behavioral: Negative for hallucinations.     Physical Exam Updated Vital Signs BP 112/75 (BP Location: Left Arm)   Pulse 98   Temp 99.5 F (37.5 C) (Oral)   Resp 15   Ht 5\' 8"  (1.727 m)   Wt 132.9 kg   LMP 05/19/2019   SpO2 96%   BMI 44.55 kg/m   Physical Exam Vitals signs and nursing note reviewed.  Constitutional:      Appearance: She is well-developed.  HENT:     Head: Normocephalic.     Mouth/Throat:     Mouth: Mucous membranes are moist.  Eyes:     General: No scleral icterus.    Conjunctiva/sclera: Conjunctivae normal.  Neck:     Musculoskeletal: Neck supple.     Thyroid: No thyromegaly.  Cardiovascular:     Rate and Rhythm: Normal rate and regular rhythm.     Heart sounds: No murmur. No friction rub. No gallop.   Pulmonary:     Breath sounds: No stridor. No wheezing or  rales.  Chest:     Chest wall: No tenderness.  Abdominal:     General: There is no distension.     Tenderness: There is no abdominal tenderness. There is no rebound.  Musculoskeletal: Normal range of motion.     Comments: Tender right flank  Lymphadenopathy:     Cervical: No cervical adenopathy.  Skin:    Findings: No erythema or rash.  Neurological:     Mental Status: She is oriented to person, place, and time.     Motor: No abnormal muscle tone.     Coordination: Coordination normal.  Psychiatric:        Behavior: Behavior normal.      ED Treatments / Results  Labs (all labs ordered are listed, but only abnormal results are displayed) Labs Reviewed  CBC WITH DIFFERENTIAL/PLATELET - Abnormal; Notable for the following components:      Result Value   WBC 11.3 (*)    Eosinophils Absolute 0.8 (*)    All other components within normal limits  COMPREHENSIVE METABOLIC PANEL - Abnormal; Notable for the following components:   Sodium 131 (*)    Potassium 3.4 (*)    CO2 21 (*)    Glucose, Bld 336 (*)    Calcium 8.5 (*)    AST 112 (*)    ALT 221 (*)    Total Bilirubin 1.4 (*)    All other components within normal limits  URINALYSIS, ROUTINE W REFLEX MICROSCOPIC - Abnormal; Notable for the following components:   APPearance CLOUDY (*)    Specific Gravity, Urine 1.033 (*)    Glucose, UA >=500 (*)    Hgb urine dipstick LARGE (*)    Protein, ur 100 (*)    Nitrite POSITIVE (*)    Leukocytes,Ua LARGE (*)    WBC, UA >50 (*)    Bacteria, UA RARE (*)    All other components within normal limits  URINE CULTURE  I-STAT BETA HCG BLOOD, ED ([facility], [facility], [facility] ONLY)    EKG None  Radiology Ct Renal Stone Study  Result Date: 06/15/2019 CLINICAL DATA:  Acute left flank pain, gross hematuria. EXAM: CT ABDOMEN AND PELVIS WITHOUT CONTRAST TECHNIQUE: Multidetector CT imaging of the abdomen and pelvis was performed following the standard protocol without IV contrast. COMPARISON:  None.  FINDINGS: Lower chest: No acute abnormality. Hepatobiliary: No gallstones or biliary dilatation is noted. Hepatic steatosis is noted. Pancreas: Unremarkable. No  pancreatic ductal dilatation or surrounding inflammatory changes. Spleen: Normal in size without focal abnormality. Adrenals/Urinary Tract: Adrenal glands appear normal. Minimal left hydroureteronephrosis is noted with periureteral inflammation, but no obstructing calculus is noted. Potentially this may be due to recently passed stone. Right kidney and ureter are unremarkable. Urinary bladder is unremarkable. Stomach/Bowel: Stomach is within normal limits. Appendix appears normal. No evidence of bowel wall thickening, distention, or inflammatory changes. Vascular/Lymphatic: No significant vascular findings are present. No enlarged abdominal or pelvic lymph nodes. Reproductive: Uterus and bilateral adnexa are unremarkable. Other: No abdominal wall hernia or abnormality. No abdominopelvic ascites. Large calcification is seen within the inferior portion of the left psoas muscle at the level of the left hip, most likely representing sequela of prior injury of this muscle. Musculoskeletal: No acute or significant osseous findings. IMPRESSION: Minimal left hydroureteronephrosis is noted with periureteral inflammation, but no obstructing calculus is noted. Most likely this is due to recently passed stone. Hepatic steatosis. Electronically Signed   By: Marijo Conception M.D.   On: 06/15/2019 17:17    Procedures Procedures (including critical care time)  Medications Ordered in ED Medications  cephALEXin (KEFLEX) capsule 500 mg (has no administration in time range)  HYDROmorphone (DILAUDID) injection 0.5 mg (0.5 mg Intravenous Given 06/15/19 1656)  ondansetron (ZOFRAN) injection 4 mg (4 mg Intravenous Given 06/15/19 1656)     Initial Impression / Assessment and Plan / ED Course  I have reviewed the triage vital signs and the nursing notes.  Pertinent labs  & imaging results that were available during my care of the patient were reviewed by me and considered in my medical decision making (see chart for details).       Labs show patient has a urinary tract infection and possible passed stone.  She will have her urine cultured start on Keflex give Motrin for pain will follow-up with Dr. Glo Herring  Final Clinical Impressions(s) / ED Diagnoses   Final diagnoses:  Acute cystitis with hematuria    ED Discharge Orders         Ordered    cephALEXin (KEFLEX) 500 MG capsule  4 times daily     06/15/19 1857    ibuprofen (ADVIL) 800 MG tablet  Every 8 hours PRN     06/15/19 1857           Milton Ferguson, MD 06/15/19 1859

## 2019-06-15 NOTE — Discharge Instructions (Addendum)
Follow-up with Dr. Glo Herring either the end of this week began next week call for an appointment

## 2019-06-18 LAB — URINE CULTURE: Culture: >=100000 — AB

## 2019-06-19 ENCOUNTER — Telehealth: Payer: Self-pay | Admitting: Emergency Medicine

## 2019-06-19 NOTE — Telephone Encounter (Signed)
Post ED Visit - Positive Culture Follow-up  Culture report reviewed by antimicrobial stewardship pharmacist: [place] Team []  Elenor Quinones, Pharm.D. []  Heide Guile, Pharm.D., BCPS AQ-ID []  Parks Neptune, Pharm.D., BCPS []  Alycia Rossetti, Pharm.D., BCPS []  [place], Pharm.D., BCPS, AAHIVP []  Legrand Como, Pharm.D., BCPS, AAHIVP [x]  Salome Arnt, PharmD, BCPS []  Johnnette Gourd, PharmD, BCPS []  Hughes Better, PharmD, BCPS []  Leeroy Cha, PharmD []  Laqueta Linden, PharmD, BCPS []  Albertina Parr, PharmD  [place] Team []  Leodis Sias, PharmD []  Lindell Spar, PharmD []  Royetta Asal, PharmD []  Graylin Shiver, Rph []  Rema Fendt) Glennon Mac, PharmD []  Arlyn Dunning, PharmD []  Netta Cedars, PharmD []  Dia Sitter, PharmD []  Leone Haven, PharmD []  Gretta Arab, PharmD []  Theodis Shove, PharmD []  Peggyann Juba, PharmD []  Reuel Boom, PharmD   Positive urine culture Treated with Cephalexin, organism sensitive to the same and no further patient follow-up is required at this time.  Danielle Zimmerman 06/19/2019, 9:22 AM

## 2019-08-04 ENCOUNTER — Other Ambulatory Visit: Payer: Self-pay

## 2019-08-04 DIAGNOSIS — Z20822 Contact with and (suspected) exposure to covid-19: Secondary | ICD-10-CM

## 2019-08-05 LAB — NOVEL CORONAVIRUS, NAA: SARS-CoV-2, NAA: NOT DETECTED

## 2019-10-03 ENCOUNTER — Emergency Department (HOSPITAL_COMMUNITY): Payer: Medicaid Other

## 2019-10-03 ENCOUNTER — Other Ambulatory Visit: Payer: Self-pay

## 2019-10-03 ENCOUNTER — Emergency Department (HOSPITAL_COMMUNITY)
Admission: EM | Admit: 2019-10-03 | Discharge: 2019-10-03 | Disposition: A | Discharge status: Home / Self Care | Payer: Medicaid Other | Attending: Emergency Medicine | Admitting: Emergency Medicine

## 2019-10-03 ENCOUNTER — Encounter (HOSPITAL_COMMUNITY): Payer: Self-pay | Admitting: *Deleted

## 2019-10-03 DIAGNOSIS — E1165 Type 2 diabetes mellitus with hyperglycemia: Secondary | ICD-10-CM | POA: Insufficient documentation

## 2019-10-03 DIAGNOSIS — Z79899 Other long term (current) drug therapy: Secondary | ICD-10-CM | POA: Diagnosis not present

## 2019-10-03 DIAGNOSIS — N760 Acute vaginitis: Secondary | ICD-10-CM | POA: Insufficient documentation

## 2019-10-03 DIAGNOSIS — J45909 Unspecified asthma, uncomplicated: Secondary | ICD-10-CM | POA: Insufficient documentation

## 2019-10-03 DIAGNOSIS — N898 Other specified noninflammatory disorders of vagina: Secondary | ICD-10-CM | POA: Diagnosis present

## 2019-10-03 DIAGNOSIS — R0789 Other chest pain: Secondary | ICD-10-CM | POA: Insufficient documentation

## 2019-10-03 DIAGNOSIS — Z7984 Long term (current) use of oral hypoglycemic drugs: Secondary | ICD-10-CM | POA: Diagnosis not present

## 2019-10-03 DIAGNOSIS — R739 Hyperglycemia, unspecified: Secondary | ICD-10-CM

## 2019-10-03 DIAGNOSIS — F17210 Nicotine dependence, cigarettes, uncomplicated: Secondary | ICD-10-CM | POA: Diagnosis not present

## 2019-10-03 DIAGNOSIS — B9689 Other specified bacterial agents as the cause of diseases classified elsewhere: Secondary | ICD-10-CM

## 2019-10-03 HISTORY — DX: Essential (primary) hypertension: I10

## 2019-10-03 LAB — BASIC METABOLIC PANEL
Anion gap: 14 (ref 5–15)
BUN: 9 mg/dL (ref 6–20)
CO2: 17 mmol/L — ABNORMAL LOW (ref 22–32)
Calcium: 9.2 mg/dL (ref 8.9–10.3)
Chloride: 101 mmol/L (ref 98–111)
Creatinine, Ser: 0.69 mg/dL (ref 0.44–1.00)
GFR calc Af Amer: >60 mL/min (ref >60)
GFR calc non Af Amer: >60 mL/min (ref >60)
Glucose, Bld: 357 mg/dL — ABNORMAL HIGH (ref 70–99)
Potassium: 3.7 mmol/L (ref 3.5–5.1)
Sodium: 132 mmol/L — ABNORMAL LOW (ref 135–145)

## 2019-10-03 LAB — CBC
HCT: 43.7 % (ref 36.0–46.0)
Hemoglobin: 14.1 g/dL (ref 12.0–15.0)
MCH: 30.4 pg (ref 26.0–34.0)
MCHC: 32.3 g/dL (ref 30.0–36.0)
MCV: 94.2 fL (ref 80.0–100.0)
Platelets: 315 10*3/uL (ref 150–400)
RBC: 4.64 MIL/uL (ref 3.87–5.11)
RDW: 13.2 % (ref 11.5–15.5)
WBC: 6.2 10*3/uL (ref 4.0–10.5)
nRBC: 0 % (ref 0.0–0.2)

## 2019-10-03 LAB — URINALYSIS, ROUTINE W REFLEX MICROSCOPIC
Bilirubin Urine: NEGATIVE
Glucose, UA: >=500 mg/dL — AB
Hgb urine dipstick: NEGATIVE
Ketones, ur: 80 mg/dL — AB
Nitrite: NEGATIVE
Protein, ur: NEGATIVE mg/dL
Specific Gravity, Urine: 1.039 — ABNORMAL HIGH (ref 1.005–1.030)
pH: 6 (ref 5.0–8.0)

## 2019-10-03 LAB — PREGNANCY, URINE: Preg Test, Ur: NEGATIVE

## 2019-10-03 LAB — WET PREP, GENITAL
Sperm: NONE SEEN
Trich, Wet Prep: NONE SEEN
Yeast Wet Prep HPF POC: NONE SEEN

## 2019-10-03 LAB — CBG MONITORING, ED
Glucose-Capillary: 341 mg/dL — ABNORMAL HIGH (ref 70–99)
Glucose-Capillary: 395 mg/dL — ABNORMAL HIGH (ref 70–99)
Glucose-Capillary: 310 mg/dL — ABNORMAL HIGH (ref 70–99)

## 2019-10-03 LAB — TROPONIN I (HIGH SENSITIVITY)
Troponin I (High Sensitivity): 6 ng/L (ref <18)
Troponin I (High Sensitivity): 8 ng/L (ref <18)

## 2019-10-03 MED ORDER — INSULIN ASPART 100 UNIT/ML ~~LOC~~ SOLN
10.0000 [IU] | Freq: Once | SUBCUTANEOUS | Status: AC
  Administered 2019-10-03: 10 [IU] via INTRAVENOUS
  Filled 2019-10-03: qty 1

## 2019-10-03 MED ORDER — ONDANSETRON HCL 4 MG PO TABS: 4.0000 mg | ORAL_TABLET | Freq: Once | ORAL | Status: DC

## 2019-10-03 MED ORDER — TRAMADOL HCL 50 MG PO TABS: 100.0000 mg | ORAL_TABLET | Freq: Once | ORAL | Status: DC

## 2019-10-03 MED ORDER — SODIUM CHLORIDE 0.9% FLUSH
3.0000 mL | Freq: Once | INTRAVENOUS | Status: AC
  Administered 2019-10-03: 3 mL via INTRAVENOUS

## 2019-10-03 MED ORDER — SODIUM CHLORIDE 0.9 % IV BOLUS (SEPSIS)
1000.0000 mL | Freq: Once | INTRAVENOUS | Status: AC
  Administered 2019-10-03: 1000 mL via INTRAVENOUS

## 2019-10-03 MED ORDER — SODIUM CHLORIDE 0.9 % IV BOLUS (SEPSIS): 1000.0000 mL | Freq: Once | INTRAVENOUS | Status: DC

## 2019-10-03 MED ORDER — ONDANSETRON HCL 4 MG PO TABS
4.0000 mg | ORAL_TABLET | Freq: Once | ORAL | Status: AC
  Administered 2019-10-03: 22:00:00 4 mg via ORAL
  Filled 2019-10-03: qty 1

## 2019-10-03 MED ORDER — METRONIDAZOLE 500 MG PO TABS: 500.0000 mg | ORAL_TABLET | Freq: Once | ORAL | Status: DC

## 2019-10-03 MED ORDER — IBUPROFEN 400 MG PO TABS
400.0000 mg | ORAL_TABLET | Freq: Once | ORAL | Status: AC
  Administered 2019-10-03: 22:00:00 400 mg via ORAL
  Filled 2019-10-03: qty 1

## 2019-10-03 MED ORDER — SODIUM CHLORIDE 0.9 % IV SOLN
1000.0000 mL | INTRAVENOUS | Status: DC
  Administered 2019-10-03: 21:00:00 1000 mL via INTRAVENOUS

## 2019-10-03 MED ORDER — METRONIDAZOLE 500 MG PO TABS: 500.0000 mg | ORAL_TABLET | Freq: Two times a day (BID) | ORAL | 0 refills | Status: DC

## 2019-10-03 MED ORDER — SODIUM CHLORIDE 0.9 % IV SOLN: 1000.0000 mL | INTRAVENOUS | Status: DC

## 2019-10-03 NOTE — ED Notes (Signed)
Lab in to draw

## 2019-10-03 NOTE — Discharge Instructions (Addendum)
Your wet prep suggest a bacterial vaginosis.  Please use Flagyl daily with food.  Please do not use any alcohol products while taking the Flagyl.  Your glucose was elevated greater than 300.  Please discuss this with Dr. Legrand Rams, as your medications may need to be adjusted.  Please increase fluids.  Your urine test suggested that you were somewhat dehydrated.  You received IV fluids while here in the emergency department.  Please return to the emergency department if any emergent changes in your condition, changes in your symptoms, problems or concerns.

## 2019-10-03 NOTE — ED Notes (Signed)
Pt not in [facility] when called

## 2019-10-03 NOTE — ED Notes (Signed)
Blood sugar 310

## 2019-10-03 NOTE — ED Notes (Signed)
To rad 

## 2019-10-03 NOTE — ED Provider Notes (Signed)
[facility] Provider Note   CSN: [id number] Arrival date & time: 10/03/19  1659     History   Chief Complaint Chief Complaint  Patient presents with  . Vaginal Discharge  . Hyperglycemia  . Chest Pain    HPI Danielle Zimmerman is a 28 y.o. female.     Pt is a 28 y/o female who presents to the ED with a c/o vaginal discharge, chest pain and elevated glucose. Pt states she is having irritation and vaginal itching for several months. The problem is getting worse, and is now accompanied by a green discharge.  Pt report right upper chest pain that is worse with arm movement or use of the chest muscles. No n/v, no sweats, no LOC, and no direct trauma reported. Pt states she is on insulin, but has not been followed closely for her glucose. She request to be evaluated for her glucose. She has been reading 600 or greater on her home machine.  The history is provided by the patient.  Vaginal Discharge Associated symptoms: no abdominal pain, no dysuria, no nausea and no vomiting   Hyperglycemia Associated symptoms: chest pain   Associated symptoms: no abdominal pain, no confusion, no dizziness, no dysuria, no nausea, no shortness of breath, no vomiting and no weakness   Chest Pain Associated symptoms: no abdominal pain, no back pain, no cough, no dizziness, no nausea, no numbness, no palpitations, no shortness of breath, no vomiting and no weakness     Past Medical History:  Diagnosis Date  . Asthma   . Chronic back pain   . Diabetes mellitus without complication (HCC) 05/2019  . GERD (gastroesophageal reflux disease)   . Hypertension   . Kidney stone   . Nausea and vomiting    recurrent  . Recurrent abdominal pain   . Seasonal allergies     There are no active problems to display for this patient.   Past Surgical History:  Procedure Laterality Date  . CESAREAN SECTION    . TONSILLECTOMY AND ADENOIDECTOMY       OB History    Gravida  1   Para     Term      Preterm      AB      Living        SAB      TAB      Ectopic      Multiple      Live Births               Home Medications    Prior to Admission medications   Medication Sig Start Date End Date Taking? Authorizing Provider  atorvastatin (LIPITOR) 20 MG tablet Take 20 mg by mouth daily.  06/07/19   [provider]  cephALEXin (KEFLEX) 500 MG capsule Take 1 capsule (500 mg total) by mouth 4 (four) times daily. 06/15/19   Bethann BerkshireZammit, Joseph, MD  ibuprofen (ADVIL) 800 MG tablet Take 1 tablet (800 mg total) by mouth every 8 (eight) hours as needed for moderate pain. 06/15/19   Bethann BerkshireZammit, Joseph, MD  LANTUS SOLOSTAR 100 UNIT/ML Solostar Pen Inject 40 Units into the skin at bedtime.  05/19/19   [provider]  losartan-hydrochlorothiazide (HYZAAR) 50-12.5 MG tablet Take 1 tablet by mouth daily.  06/07/19   [provider]  metFORMIN (GLUCOPHAGE) 500 MG tablet Take 1 tablet (500 mg total) by mouth 2 (two) times daily with a meal for 30 days. Patient not taking: Reported on  06/15/2019 04/21/19 05/21/19  Fransico Meadow, PA-C    Family History No family history on file.  Social History Social History   Tobacco Use  . Smoking status: Current Every Day Smoker    Types: Cigarettes  . Smokeless tobacco: Never Used  . Tobacco comment: 2-3 cigarettes daily  Substance Use Topics  . Alcohol use: No  . Drug use: Not Currently    Types: Marijuana     Allergies   Hydrocodone   Review of Systems Review of Systems  Constitutional: Negative for activity change and appetite change.  HENT: Negative for congestion, ear discharge, ear pain, facial swelling, nosebleeds, rhinorrhea, sneezing and tinnitus.   Eyes: Negative for photophobia, pain and discharge.  Respiratory: Negative for cough, choking, shortness of breath and wheezing.   Cardiovascular: Positive for chest pain. Negative for palpitations and leg swelling.  Gastrointestinal: Negative for  abdominal pain, blood in stool, constipation, diarrhea, nausea and vomiting.  Genitourinary: Positive for vaginal discharge. Negative for difficulty urinating, dysuria, flank pain, frequency and hematuria.  Musculoskeletal: Negative for back pain, gait problem, myalgias and neck pain.  Skin: Negative for color change, rash and wound.  Neurological: Negative for dizziness, seizures, syncope, facial asymmetry, speech difficulty, weakness and numbness.  Hematological: Negative for adenopathy. Does not bruise/bleed easily.  Psychiatric/Behavioral: Negative for agitation, confusion, hallucinations, self-injury and suicidal ideas. The patient is not nervous/anxious.      Physical Exam Updated Vital Signs BP (!) 144/94 (BP Location: Right Arm)   Pulse (!) 102   Temp 98.2 F (36.8 C) (Oral)   Resp 18   Ht 5\' 8"  (1.727 m)   Wt 113.4 kg   LMP 09/19/2019   SpO2 99%   BMI 38.01 kg/m   Physical Exam Vitals signs and nursing note reviewed. Exam conducted with a chaperone present.  Constitutional:      Appearance: She is well-developed. She is not toxic-appearing.  HENT:     Head: Normocephalic.     Right Ear: Tympanic membrane and external ear normal.     Left Ear: Tympanic membrane and external ear normal.  Eyes:     General: Lids are normal.     Pupils: Pupils are equal, round, and reactive to light.  Neck:     Musculoskeletal: Normal range of motion and neck supple.     Vascular: No carotid bruit.  Cardiovascular:     Rate and Rhythm: Regular rhythm. Tachycardia present.     Pulses: Normal pulses.     Heart sounds: Normal heart sounds.  Pulmonary:     Effort: No respiratory distress.     Breath sounds: Normal breath sounds.  Chest:     Chest wall: Tenderness present. No swelling or edema.    Abdominal:     General: Bowel sounds are normal.     Palpations: Abdomen is soft.     Tenderness: There is no abdominal tenderness. There is no guarding.  Genitourinary:    Exam  position: Lithotomy position.     Pubic Area: No rash.      Labia:        Right: No rash, tenderness or injury.        Left: No rash, tenderness or injury.      Vagina: No foreign body. Vaginal discharge present. No bleeding.     Cervix: Discharge, friability and erythema present. No cervical motion tenderness.     Uterus: Normal.      Adnexa: Right adnexa normal and left adnexa normal.  Musculoskeletal: Normal range of motion.  Lymphadenopathy:     Head:     Right side of head: No submandibular adenopathy.     Left side of head: No submandibular adenopathy.     Cervical: No cervical adenopathy.  Skin:    General: Skin is warm and dry.  Neurological:     Mental Status: She is alert and oriented to person, place, and time.     Cranial Nerves: No cranial nerve deficit.     Sensory: No sensory deficit.  Psychiatric:        Speech: Speech normal.      ED Treatments / Results  Labs (all labs ordered are listed, but only abnormal results are displayed) Labs Reviewed  CBG MONITORING, ED - Abnormal; Notable for the following components:      Result Value   Glucose-Capillary 341 (*)    All other components within normal limits  BASIC METABOLIC PANEL  CBC  URINALYSIS, ROUTINE W REFLEX MICROSCOPIC  CBG MONITORING, ED  CBG MONITORING, ED  TROPONIN I (HIGH SENSITIVITY)    EKG EKG Interpretation  Date/Time:  Saturday October 03 2019 17:30:24 EDT Ventricular Rate:  98 PR Interval:  146 QRS Duration: 90 QT Interval:  346 QTC Calculation: 441 R Axis:   57 Text Interpretation: Normal sinus rhythm Normal ECG No STEMI Confirmed by Alvester Chou [phone]) on 10/03/2019 5:51:48 PM   Radiology No results found.  Procedures Procedures (including critical care time)  Medications Ordered in ED Medications  sodium chloride flush (NS) 0.9 % injection 3 mL (has no administration in time range)     Initial Impression / Assessment and Plan / ED Course  I have reviewed the triage  vital signs and the nursing notes.  Pertinent labs & imaging results that were available during my care of the patient were reviewed by me and considered in my medical decision making (see chart for details).          Final Clinical Impressions(s) / ED Diagnoses MDM  Vital signs reviewed. Pt has a tachycardia. Pulse ox is 99%, WNL by my interpretation. Pregnancy test negative.  Pt presents with vaginal discharge and itching. Wet prep neg for yeast, trich. Clue cells present and wbc present. Pt will be started on flagyl.  BMP - glucose elevated at 357, CO2 low at 17, anion gap wnl. IV fluids started.  CBC wnl. Troponin wnl. EKG non-acute.  Chest xray - normal study.  Glucose recheck after 1st liter of fluid 395. IV insulin given.  Patient complained of some right upper chest pain.  This pain can be reproduced by palpation and also range of motion of the right arm.  Patient treated with pain medication.  Recheck.  Patient's glucose is down to 310.  Patient is awake and alert and in no distress whatsoever.  Patient talking and texting on her cell phone.  Patient to be discharged home.  Of asked the patient to follow-up with her primary physician concerning the glucose elevation, and the need for adjustment in medications.  I have asked her to use the Flagyl daily with food for her bacterial vaginosis.  Of asked her to use Tylenol extra strength and warm tub soaks for her right upper chest area pain.   Final diagnoses:  Hyperglycemia  BV (bacterial vaginosis)    ED Discharge Orders    None       Ivery Quale, PA-C 10/04/19 0150    Terald Sleeper, MD 10/04/19 [phone]

## 2019-10-03 NOTE — ED Triage Notes (Signed)
Pt c/o vaginal irritation, itching x several months. Pt reports yesterday she started having lime green colored vaginal discharge. Pt also c/o chest pain that has been going on for 4-5 days, worse with movement of her arms. Pt also reports her blood sugars have been running around 600 ever since she found out about her diabetes in June.

## 2019-10-03 NOTE — ED Notes (Signed)
Pt is followed by Dr Legrand Rams, but states he does not follow her as she wishes for her diabetes and she essentially has no physician and has her medical needs taken care of by EDs  Reports vaginal discharge, shortness of breath, chest pain on and off   She reports her sugar is high as she has a regular sprite at the bedside

## 2019-10-06 LAB — GC/CHLAMYDIA PROBE AMP (~~LOC~~) NOT AT ARMC
Chlamydia: NEGATIVE
Neisseria Gonorrhea: NEGATIVE

## 2019-11-06 ENCOUNTER — Other Ambulatory Visit: Payer: Self-pay

## 2019-11-06 ENCOUNTER — Emergency Department (HOSPITAL_COMMUNITY): Payer: Medicaid Other

## 2019-11-06 ENCOUNTER — Emergency Department (HOSPITAL_COMMUNITY)
Admission: EM | Admit: 2019-11-06 | Discharge: 2019-11-07 | Disposition: A | Discharge status: Home / Self Care | Payer: Medicaid Other | Attending: Emergency Medicine | Admitting: Emergency Medicine

## 2019-11-06 ENCOUNTER — Encounter (HOSPITAL_COMMUNITY): Payer: Self-pay

## 2019-11-06 DIAGNOSIS — R0781 Pleurodynia: Secondary | ICD-10-CM

## 2019-11-06 DIAGNOSIS — Z794 Long term (current) use of insulin: Secondary | ICD-10-CM | POA: Insufficient documentation

## 2019-11-06 DIAGNOSIS — J45909 Unspecified asthma, uncomplicated: Secondary | ICD-10-CM | POA: Insufficient documentation

## 2019-11-06 DIAGNOSIS — R0602 Shortness of breath: Secondary | ICD-10-CM | POA: Diagnosis present

## 2019-11-06 DIAGNOSIS — F17210 Nicotine dependence, cigarettes, uncomplicated: Secondary | ICD-10-CM | POA: Insufficient documentation

## 2019-11-06 DIAGNOSIS — U071 COVID-19: Secondary | ICD-10-CM | POA: Diagnosis not present

## 2019-11-06 DIAGNOSIS — I10 Essential (primary) hypertension: Secondary | ICD-10-CM | POA: Insufficient documentation

## 2019-11-06 DIAGNOSIS — E1165 Type 2 diabetes mellitus with hyperglycemia: Secondary | ICD-10-CM | POA: Diagnosis not present

## 2019-11-06 DIAGNOSIS — R739 Hyperglycemia, unspecified: Secondary | ICD-10-CM

## 2019-11-06 LAB — CBC WITH DIFFERENTIAL/PLATELET
Abs Immature Granulocytes: 0.07 10*3/uL (ref 0.00–0.07)
Basophils Absolute: 0 10*3/uL (ref 0.0–0.1)
Basophils Relative: 1 %
Eosinophils Absolute: 0.1 10*3/uL (ref 0.0–0.5)
Eosinophils Relative: 2 %
HCT: 45.8 % (ref 36.0–46.0)
Hemoglobin: 15.2 g/dL — ABNORMAL HIGH (ref 12.0–15.0)
Immature Granulocytes: 1 %
Lymphocytes Relative: 23 %
Lymphs Abs: 1.7 10*3/uL (ref 0.7–4.0)
MCH: 30.5 pg (ref 26.0–34.0)
MCHC: 33.2 g/dL (ref 30.0–36.0)
MCV: 92 fL (ref 80.0–100.0)
Monocytes Absolute: 0.6 10*3/uL (ref 0.1–1.0)
Monocytes Relative: 8 %
Neutro Abs: 5 10*3/uL (ref 1.7–7.7)
Neutrophils Relative %: 65 %
Platelets: 364 10*3/uL (ref 150–400)
RBC: 4.98 MIL/uL (ref 3.87–5.11)
RDW: 12.5 % (ref 11.5–15.5)
WBC: 7.5 10*3/uL (ref 4.0–10.5)
nRBC: 0 % (ref 0.0–0.2)

## 2019-11-06 LAB — COMPREHENSIVE METABOLIC PANEL
ALT: 76 U/L — ABNORMAL HIGH (ref 0–44)
AST: 49 U/L — ABNORMAL HIGH (ref 15–41)
Albumin: 4.4 g/dL (ref 3.5–5.0)
Alkaline Phosphatase: 100 U/L (ref 38–126)
Anion gap: 14 (ref 5–15)
BUN: 11 mg/dL (ref 6–20)
CO2: 17 mmol/L — ABNORMAL LOW (ref 22–32)
Calcium: 9.3 mg/dL (ref 8.9–10.3)
Chloride: 98 mmol/L (ref 98–111)
Creatinine, Ser: 0.68 mg/dL (ref 0.44–1.00)
GFR calc Af Amer: >60 mL/min (ref >60)
GFR calc non Af Amer: >60 mL/min (ref >60)
Glucose, Bld: 460 mg/dL — ABNORMAL HIGH (ref 70–99)
Potassium: 3.5 mmol/L (ref 3.5–5.1)
Sodium: 129 mmol/L — ABNORMAL LOW (ref 135–145)
Total Bilirubin: 1.6 mg/dL — ABNORMAL HIGH (ref 0.3–1.2)
Total Protein: 7.8 g/dL (ref 6.5–8.1)

## 2019-11-06 LAB — URINALYSIS, ROUTINE W REFLEX MICROSCOPIC
Bacteria, UA: NONE SEEN
Bilirubin Urine: NEGATIVE
Glucose, UA: >=500 mg/dL — AB
Ketones, ur: 20 mg/dL — AB
Leukocytes,Ua: NEGATIVE
Nitrite: NEGATIVE
Protein, ur: NEGATIVE mg/dL
Specific Gravity, Urine: 1.028 (ref 1.005–1.030)
pH: 5 (ref 5.0–8.0)

## 2019-11-06 LAB — CBG MONITORING, ED: Glucose-Capillary: 461 mg/dL — ABNORMAL HIGH (ref 70–99)

## 2019-11-06 LAB — PREGNANCY, URINE: Preg Test, Ur: NEGATIVE

## 2019-11-06 MED ORDER — IOHEXOL 350 MG/ML SOLN
100.0000 mL | Freq: Once | INTRAVENOUS | Status: AC | PRN
  Administered 2019-11-06: 100 mL via INTRAVENOUS

## 2019-11-06 MED ORDER — SODIUM CHLORIDE 0.9 % IV BOLUS
1000.0000 mL | Freq: Once | INTRAVENOUS | Status: AC
  Administered 2019-11-06: 1000 mL via INTRAVENOUS

## 2019-11-06 MED ORDER — INSULIN ASPART 100 UNIT/ML ~~LOC~~ SOLN
SUBCUTANEOUS | Status: AC
  Filled 2019-11-06: qty 1

## 2019-11-06 MED ORDER — INSULIN ASPART 100 UNIT/ML ~~LOC~~ SOLN
5.0000 [IU] | Freq: Once | SUBCUTANEOUS | Status: AC
  Administered 2019-11-06: 5 [IU] via SUBCUTANEOUS
  Filled 2019-11-06: qty 1

## 2019-11-06 MED ORDER — KETOROLAC TROMETHAMINE 15 MG/ML IJ SOLN
15.0000 mg | Freq: Once | INTRAMUSCULAR | Status: AC
  Administered 2019-11-07: 15 mg via INTRAVENOUS
  Filled 2019-11-06: qty 1

## 2019-11-06 MED ORDER — FENTANYL CITRATE (PF) 100 MCG/2ML IJ SOLN
25.0000 ug | Freq: Once | INTRAMUSCULAR | Status: AC
  Administered 2019-11-06: 22:00:00 25 ug via INTRAVENOUS
  Filled 2019-11-06: qty 2

## 2019-11-06 NOTE — ED Triage Notes (Signed)
Pt reports SOB that started today around noon. Pt also c/o left rib/side pain. Also reports being out of insulin for two days, pt says her sugar always runs "high" when she checks it, pt reports feeling "kinda faint", headache.

## 2019-11-06 NOTE — ED Notes (Signed)
Pt has chips and pepsi in hand during triage-instructed NOT to drink - ok to have water.

## 2019-11-06 NOTE — ED Notes (Signed)
No insulin in the pyxis. AC bringing insulin down.

## 2019-11-06 NOTE — ED Provider Notes (Signed)
[facility] Emergency Department Provider Note MRN:  [id number]  Arrival date & time: 11/06/19     Chief Complaint   Shortness of Breath (left rib pain)   History of Present Illness   Danielle Zimmerman is a 28 y.o. year-old female with a history of diabetes presenting to the ED with chief complaint of shortness of breath.  Sudden onset shortness of breath earlier today, also with left-sided rib pain, sharp, worse with deep breaths.  Feeling dizzy/lightheaded throughout the day as well.  Has been out of insulin for the past 2 days, feels dehydrated.  Denies abdominal pain, no dysuria, no cough, no fever.  Symptoms constant, moderate severity, no exacerbating or alleviating factors.  Review of Systems  A complete 10 system review of systems was obtained and all systems are negative except as noted in the HPI and PMH.   Patient's Health History    Past Medical History:  Diagnosis Date  . Asthma   . Chronic back pain   . Diabetes mellitus without complication ([place]) 83/6629  . GERD (gastroesophageal reflux disease)   . Hypertension   . Kidney stone   . Nausea and vomiting    recurrent  . Recurrent abdominal pain   . Seasonal allergies     Past Surgical History:  Procedure Laterality Date  . CESAREAN SECTION    . TONSILLECTOMY AND ADENOIDECTOMY      No family history on file.  Social History   Socioeconomic History  . Marital status: Single    Spouse name: Not on file  . Number of children: Not on file  . Years of education: Not on file  . Highest education level: Not on file  Occupational History  . Not on file  Social Needs  . Financial resource strain: Not on file  . Food insecurity    Worry: Not on file    Inability: Not on file  . Transportation needs    Medical: Not on file    Non-medical: Not on file  Tobacco Use  . Smoking status: Current Every Day Smoker    Types: Cigarettes  . Smokeless tobacco: Never Used  . Tobacco comment: 2-3  cigarettes daily  Substance and Sexual Activity  . Alcohol use: No  . Drug use: Not Currently    Types: Marijuana  . Sexual activity: Not on file  Lifestyle  . Physical activity    Days per week: Not on file    Minutes per session: Not on file  . Stress: Not on file  Relationships  . Social Herbalist on phone: Not on file    Gets together: Not on file    Attends religious service: Not on file    Active member of club or organization: Not on file    Attends meetings of clubs or organizations: Not on file    Relationship status: Not on file  . Intimate partner violence    Fear of current or ex partner: Not on file    Emotionally abused: Not on file    Physically abused: Not on file    Forced sexual activity: Not on file  Other Topics Concern  . Not on file  Social History Narrative  . Not on file     Physical Exam  Vital Signs and Nursing Notes reviewed Vitals:   11/06/19 1918  BP: (!) 167/117  Pulse: (!) 114  Resp: (!) 24  Temp: 100 F (37.8 C)  SpO2: 100%  CONSTITUTIONAL: Well-appearing, NAD NEURO:  Alert and oriented x 3, no focal deficits EYES:  eyes equal and reactive ENT/NECK:  no LAD, no JVD CARDIO: Tachycardic rate, well-perfused, normal S1 and S2 PULM:  CTAB no wheezing or rhonchi GI/GU:  normal bowel sounds, non-distended, non-tender MSK/SPINE:  No gross deformities, no edema SKIN:  no rash, atraumatic PSYCH:  Appropriate speech and behavior  Diagnostic and Interventional Summary    EKG Interpretation  Date/Time:  Friday November 06 2019 19:14:12 EST Ventricular Rate:  109 PR Interval:  154 QRS Duration: 86 QT Interval:  324 QTC Calculation: 436 R Axis:   101 Text Interpretation: Sinus tachycardia Rightward axis Borderline ECG Confirmed by Kennis CarinaBero, Orlan Aversa [phone]([id number]) on 11/06/2019 9:21:31 PM      Labs Reviewed  CBC WITH DIFFERENTIAL/PLATELET - Abnormal; Notable for the following components:      Result Value   Hemoglobin 15.2 (*)     All other components within normal limits  COMPREHENSIVE METABOLIC PANEL - Abnormal; Notable for the following components:   Sodium 129 (*)    CO2 17 (*)    Glucose, Bld 460 (*)    AST 49 (*)    ALT 76 (*)    Total Bilirubin 1.6 (*)    All other components within normal limits  URINALYSIS, ROUTINE W REFLEX MICROSCOPIC - Abnormal; Notable for the following components:   Color, Urine STRAW (*)    Glucose, UA >=500 (*)    Hgb urine dipstick SMALL (*)    Ketones, ur 20 (*)    All other components within normal limits  CBG MONITORING, ED - Abnormal; Notable for the following components:   Glucose-Capillary 461 (*)    All other components within normal limits  PREGNANCY, URINE    CTA Chest for PE  Final Result      Medications  ketorolac (TORADOL) 15 MG/ML injection 15 mg (has no administration in time range)  sodium chloride 0.9 % bolus 1,000 mL (1,000 mLs Intravenous New Bag/Given 11/06/19 2201)  insulin aspart (novoLOG) injection 5 Units (5 Units Subcutaneous Given 11/06/19 2226)  sodium chloride 0.9 % bolus 1,000 mL (1,000 mLs Intravenous New Bag/Given 11/06/19 2202)  fentaNYL (SUBLIMAZE) injection 25 mcg (25 mcg Intravenous Given 11/06/19 2201)  iohexol (OMNIPAQUE) 350 MG/ML injection 100 mL (100 mLs Intravenous Contrast Given 11/06/19 2229)  insulin aspart (novoLOG) 100 UNIT/ML injection (has no administration in time range)     Procedures  /  Critical Care Procedures  ED Course and Medical Decision Making  I have reviewed the triage vital signs and the nursing notes.  Pertinent labs & imaging results that were available during my care of the patient were reviewed by me and considered in my medical decision making (see below for details).     Concern for pulmonary embolism given the constellation of symptoms, will exclude with CTA.  Patient is also hyperglycemic, doubt DKA, will provide fluids and small dose insulin.  CTA is unremarkable.  Will provide IV fluids, insulin,  symptomatic management, reassess.  Labs reveal very mild acidosis but this is favored to be related to dehydration rather than DKA.  Will recheck blood sugar and attempt discharge.  Signed out to oncoming provider at shift change.  Elmer SowMichael M. Pilar PlateBero, MD [facility] [email or web address]  Final Clinical Impressions(s) / ED Diagnoses     ICD-10-CM   1. Shortness of breath  R06.02   2. Hyperglycemia  R73.9     ED Discharge Orders  None       Discharge Instructions Discussed with and Provided to Patient:   Discharge Instructions   None       Sabas Sous, MD 11/06/19 2358

## 2019-11-06 NOTE — ED Notes (Signed)
Patient transported to CT 

## 2019-11-07 LAB — BASIC METABOLIC PANEL
Anion gap: 13 (ref 5–15)
BUN: 7 mg/dL (ref 6–20)
CO2: 18 mmol/L — ABNORMAL LOW (ref 22–32)
Calcium: 8.2 mg/dL — ABNORMAL LOW (ref 8.9–10.3)
Chloride: 103 mmol/L (ref 98–111)
Creatinine, Ser: 0.58 mg/dL (ref 0.44–1.00)
GFR calc Af Amer: >60 mL/min (ref >60)
GFR calc non Af Amer: >60 mL/min (ref >60)
Glucose, Bld: 308 mg/dL — ABNORMAL HIGH (ref 70–99)
Potassium: 3.6 mmol/L (ref 3.5–5.1)
Sodium: 134 mmol/L — ABNORMAL LOW (ref 135–145)

## 2019-11-07 LAB — CBG MONITORING, ED: Glucose-Capillary: 294 mg/dL — ABNORMAL HIGH (ref 70–99)

## 2019-11-07 NOTE — ED Provider Notes (Signed)
Care assumed from Dr. Orland Dec, patient with hyperglycemia because she had run out of insulin for 2 days.  Low CO2 noted on initial basic metabolic panel but with normal anion gap.  Basic metabolic panel was repeated, and glucose is down to 308 and CO2 is up to 18 with an anion gap of 13.  This is not consistent with ketoacidosis.  Patient tells me that she had received information from her pharmacy that her insulin is available for pickup in the morning, so she will not be missing any additional doses of her insulin.  Pleuritic chest pain of uncertain cause.  Patient does have some malaise and specimen is sent to evaluate for COVID-19.  She is to follow-up with her PCP, return precautions discussed.  Results for orders placed or performed during the hospital encounter of 11/06/19  CBC with Differential  Result Value Ref Range   WBC 7.5 4.0 - 10.5 K/uL   RBC 4.98 3.87 - 5.11 MIL/uL   Hemoglobin 15.2 (H) 12.0 - 15.0 g/dL   HCT 22.9 79.8 - 92.1 %   MCV 92.0 80.0 - 100.0 fL   MCH 30.5 26.0 - 34.0 pg   MCHC 33.2 30.0 - 36.0 g/dL   RDW 19.4 17.4 - 08.1 %   Platelets 364 150 - 400 K/uL   nRBC 0.0 0.0 - 0.2 %   Neutrophils Relative % 65 %   Neutro Abs 5.0 1.7 - 7.7 K/uL   Lymphocytes Relative 23 %   Lymphs Abs 1.7 0.7 - 4.0 K/uL   Monocytes Relative 8 %   Monocytes Absolute 0.6 0.1 - 1.0 K/uL   Eosinophils Relative 2 %   Eosinophils Absolute 0.1 0.0 - 0.5 K/uL   Basophils Relative 1 %   Basophils Absolute 0.0 0.0 - 0.1 K/uL   Immature Granulocytes 1 %   Abs Immature Granulocytes 0.07 0.00 - 0.07 K/uL  Comprehensive metabolic panel  Result Value Ref Range   Sodium 129 (L) 135 - 145 mmol/L   Potassium 3.5 3.5 - 5.1 mmol/L   Chloride 98 98 - 111 mmol/L   CO2 17 (L) 22 - 32 mmol/L   Glucose, Bld 460 (H) 70 - 99 mg/dL   BUN 11 6 - 20 mg/dL   Creatinine, Ser 4.48 0.44 - 1.00 mg/dL   Calcium 9.3 8.9 - 18.5 mg/dL   Total Protein 7.8 6.5 - 8.1 g/dL   Albumin 4.4 3.5 - 5.0 g/dL   AST 49 (H) 15 -  41 U/L   ALT 76 (H) 0 - 44 U/L   Alkaline Phosphatase 100 38 - 126 U/L   Total Bilirubin 1.6 (H) 0.3 - 1.2 mg/dL   GFR calc non Af Amer >60 >60 mL/min   GFR calc Af Amer >60 >60 mL/min   Anion gap 14 5 - 15  Urinalysis, Routine w reflex microscopic  Result Value Ref Range   Color, Urine STRAW (A) YELLOW   APPearance CLEAR CLEAR   Specific Gravity, Urine 1.028 1.005 - 1.030   pH 5.0 5.0 - 8.0   Glucose, UA >=500 (A) NEGATIVE mg/dL   Hgb urine dipstick SMALL (A) NEGATIVE   Bilirubin Urine NEGATIVE NEGATIVE   Ketones, ur 20 (A) NEGATIVE mg/dL   Protein, ur NEGATIVE NEGATIVE mg/dL   Nitrite NEGATIVE NEGATIVE   Leukocytes,Ua NEGATIVE NEGATIVE   RBC / HPF 0-5 0 - 5 RBC/hpf   WBC, UA 0-5 0 - 5 WBC/hpf   Bacteria, UA NONE SEEN NONE SEEN   Squamous  Epithelial / LPF 21-50 0 - 5  Pregnancy, urine  Result Value Ref Range   Preg Test, Ur NEGATIVE NEGATIVE  BMP  Result Value Ref Range   Sodium 134 (L) 135 - 145 mmol/L   Potassium 3.6 3.5 - 5.1 mmol/L   Chloride 103 98 - 111 mmol/L   CO2 18 (L) 22 - 32 mmol/L   Glucose, Bld 308 (H) 70 - 99 mg/dL   BUN 7 6 - 20 mg/dL   Creatinine, Ser 0.58 0.44 - 1.00 mg/dL   Calcium 8.2 (L) 8.9 - 10.3 mg/dL   GFR calc non Af Amer >60 >60 mL/min   GFR calc Af Amer >60 >60 mL/min   Anion gap 13 5 - 15  CBG monitoring, ED  Result Value Ref Range   Glucose-Capillary 461 (H) 70 - 99 mg/dL  CBG monitoring, ED  Result Value Ref Range   Glucose-Capillary 294 (H) 70 - 99 mg/dL   Cta Chest For Pe  Result Date: 11/06/2019 CLINICAL DATA:  Dry cough for 1-2 months. Chest congestion for 1 month. Shortness of breath. Left-sided rib pain. EXAM: CT ANGIOGRAPHY CHEST WITH CONTRAST TECHNIQUE: Multidetector CT imaging of the chest was performed using the standard protocol during bolus administration of intravenous contrast. Multiplanar CT image reconstructions and MIPs were obtained to evaluate the vascular anatomy. CONTRAST:  177mL OMNIPAQUE IOHEXOL 350 MG/ML SOLN  COMPARISON:  Chest x-ray October 03, 2019 FINDINGS: Cardiovascular: Satisfactory opacification of the pulmonary arteries to the segmental level. No evidence of pulmonary embolism. Normal heart size. No pericardial effusion. Mediastinum/Nodes: No enlarged mediastinal, hilar, or axillary lymph nodes. Thyroid gland, trachea, and esophagus demonstrate no significant findings. Lungs/Pleura: Lungs are clear. No pleural effusion or pneumothorax. Upper Abdomen: No acute abnormality. Musculoskeletal: No chest wall abnormality. No acute or significant osseous findings. Review of the MIP images confirms the above findings. IMPRESSION: 1. No pulmonary emboli.  No acute abnormalities. Electronically Signed   By: Dorise Bullion III M.D   On: 11/06/2019 22:47   Danielle Zimmerman was evaluated in Emergency Department on 11/07/2019 for the symptoms described in the history of present illness. She was evaluated in the context of the global COVID-19 pandemic, which necessitated consideration that the patient might be at risk for infection with the SARS-CoV-2 virus that causes COVID-19. Institutional protocols and algorithms that pertain to the evaluation of patients at risk for COVID-19 are in a state of rapid change based on information released by regulatory bodies including the CDC and federal and state organizations. These policies and algorithms were followed during the patient's care in the ED.    Delora Fuel, MD 32/20/25 [phone]

## 2019-11-07 NOTE — Discharge Instructions (Signed)
Please do not allow yourself to miss any doses of your Insulin.  Take acetaminophen or ibuprofen as needed for pain.  Your COVID-10 test result will be available on MyChart.

## 2019-11-08 ENCOUNTER — Emergency Department (HOSPITAL_COMMUNITY)
Admission: EM | Admit: 2019-11-08 | Discharge: 2019-11-08 | Disposition: A | Discharge status: Home / Self Care | Payer: Medicaid Other | Attending: Emergency Medicine | Admitting: Emergency Medicine

## 2019-11-08 ENCOUNTER — Encounter (HOSPITAL_COMMUNITY): Payer: Self-pay

## 2019-11-08 ENCOUNTER — Other Ambulatory Visit: Payer: Self-pay

## 2019-11-08 DIAGNOSIS — J45909 Unspecified asthma, uncomplicated: Secondary | ICD-10-CM | POA: Insufficient documentation

## 2019-11-08 DIAGNOSIS — Z794 Long term (current) use of insulin: Secondary | ICD-10-CM | POA: Diagnosis not present

## 2019-11-08 DIAGNOSIS — E119 Type 2 diabetes mellitus without complications: Secondary | ICD-10-CM | POA: Diagnosis not present

## 2019-11-08 DIAGNOSIS — Z79899 Other long term (current) drug therapy: Secondary | ICD-10-CM | POA: Insufficient documentation

## 2019-11-08 DIAGNOSIS — R531 Weakness: Secondary | ICD-10-CM | POA: Diagnosis not present

## 2019-11-08 DIAGNOSIS — U071 COVID-19: Secondary | ICD-10-CM | POA: Diagnosis not present

## 2019-11-08 DIAGNOSIS — F17210 Nicotine dependence, cigarettes, uncomplicated: Secondary | ICD-10-CM | POA: Insufficient documentation

## 2019-11-08 DIAGNOSIS — M7918 Myalgia, other site: Secondary | ICD-10-CM | POA: Insufficient documentation

## 2019-11-08 LAB — CBC WITH DIFFERENTIAL/PLATELET
Abs Immature Granulocytes: 0.05 10*3/uL (ref 0.00–0.07)
Basophils Absolute: 0 10*3/uL (ref 0.0–0.1)
Basophils Relative: 1 %
Eosinophils Absolute: 0.6 10*3/uL — ABNORMAL HIGH (ref 0.0–0.5)
Eosinophils Relative: 12 %
HCT: 42.6 % (ref 36.0–46.0)
Hemoglobin: 14 g/dL (ref 12.0–15.0)
Immature Granulocytes: 1 %
Lymphocytes Relative: 33 %
Lymphs Abs: 1.5 10*3/uL (ref 0.7–4.0)
MCH: 30.8 pg (ref 26.0–34.0)
MCHC: 32.9 g/dL (ref 30.0–36.0)
MCV: 93.8 fL (ref 80.0–100.0)
Monocytes Absolute: 0.5 10*3/uL (ref 0.1–1.0)
Monocytes Relative: 10 %
Neutro Abs: 2 10*3/uL (ref 1.7–7.7)
Neutrophils Relative %: 43 %
Platelets: 317 10*3/uL (ref 150–400)
RBC: 4.54 MIL/uL (ref 3.87–5.11)
RDW: 12.6 % (ref 11.5–15.5)
WBC: 4.7 10*3/uL (ref 4.0–10.5)
nRBC: 0 % (ref 0.0–0.2)

## 2019-11-08 LAB — COMPREHENSIVE METABOLIC PANEL
ALT: 60 U/L — ABNORMAL HIGH (ref 0–44)
AST: 34 U/L (ref 15–41)
Albumin: 3.8 g/dL (ref 3.5–5.0)
Alkaline Phosphatase: 107 U/L (ref 38–126)
Anion gap: 13 (ref 5–15)
BUN: 9 mg/dL (ref 6–20)
CO2: 23 mmol/L (ref 22–32)
Calcium: 9.1 mg/dL (ref 8.9–10.3)
Chloride: 98 mmol/L (ref 98–111)
Creatinine, Ser: 0.63 mg/dL (ref 0.44–1.00)
GFR calc Af Amer: >60 mL/min (ref >60)
GFR calc non Af Amer: >60 mL/min (ref >60)
Glucose, Bld: 409 mg/dL — ABNORMAL HIGH (ref 70–99)
Potassium: 3.8 mmol/L (ref 3.5–5.1)
Sodium: 134 mmol/L — ABNORMAL LOW (ref 135–145)
Total Bilirubin: 1 mg/dL (ref 0.3–1.2)
Total Protein: 7.1 g/dL (ref 6.5–8.1)

## 2019-11-08 LAB — CBG MONITORING, ED: Glucose-Capillary: 352 mg/dL — ABNORMAL HIGH (ref 70–99)

## 2019-11-08 LAB — SARS CORONAVIRUS 2 (TAT 6-24 HRS): SARS Coronavirus 2: POSITIVE — AB

## 2019-11-08 MED ORDER — CYCLOBENZAPRINE HCL 10 MG PO TABS
10.0000 mg | ORAL_TABLET | Freq: Once | ORAL | Status: AC
  Administered 2019-11-08: 10 mg via ORAL
  Filled 2019-11-08: qty 1

## 2019-11-08 MED ORDER — KETOROLAC TROMETHAMINE 30 MG/ML IJ SOLN
30.0000 mg | Freq: Once | INTRAMUSCULAR | Status: AC
  Administered 2019-11-08: 30 mg via INTRAVENOUS
  Filled 2019-11-08: qty 1

## 2019-11-08 MED ORDER — CYCLOBENZAPRINE HCL 10 MG PO TABS: 10.0000 mg | ORAL_TABLET | Freq: Three times a day (TID) | ORAL | 0 refills | Status: DC | PRN

## 2019-11-08 MED ORDER — SODIUM CHLORIDE 0.9 % IV BOLUS
1000.0000 mL | Freq: Once | INTRAVENOUS | Status: AC
  Administered 2019-11-08: 1000 mL via INTRAVENOUS

## 2019-11-08 MED ORDER — INSULIN ASPART 100 UNIT/ML ~~LOC~~ SOLN
10.0000 [IU] | Freq: Once | SUBCUTANEOUS | Status: AC
  Administered 2019-11-08: 10 [IU] via SUBCUTANEOUS
  Filled 2019-11-08: qty 1

## 2019-11-08 NOTE — Discharge Instructions (Addendum)
Drink plenty of fluids.  Follow-up this week to get your diabetes under better control.  Quarantine as instructed yesterday

## 2019-11-08 NOTE — ED Triage Notes (Signed)
Pt reports here yesterday for cp, sob, and left rib pain.  Today pt reports no sob but generalized body aches.  Reports has covid.

## 2019-11-08 NOTE — ED Provider Notes (Signed)
[facility] EMERGENCY DEPARTMENT Provider Note   CSN: [id number] Arrival date & time: 11/08/19  6160     History   Chief Complaint Chief Complaint  Patient presents with   Generalized Body Aches    HPI Danielle Zimmerman is a 28 y.o. female.     Patient was tested positive for Covid yesterday.  She complains of myalgias.  Patient also has diabetes.  The history is provided by the patient. No language interpreter was used.  Weakness Severity:  Mild Onset quality:  Sudden Timing:  Constant Progression:  Waxing and waning Chronicity:  New Context: not alcohol use   Relieved by:  Nothing Associated symptoms: no abdominal pain, no chest pain, no cough, no diarrhea, no frequency, no headaches and no seizures     Past Medical History:  Diagnosis Date   Asthma    Chronic back pain    Diabetes mellitus without complication ([place]) 73/7106   GERD (gastroesophageal reflux disease)    Hypertension    Kidney stone    Nausea and vomiting    recurrent   Recurrent abdominal pain    Seasonal allergies     There are no active problems to display for this patient.   Past Surgical History:  Procedure Laterality Date   CESAREAN SECTION     TONSILLECTOMY AND ADENOIDECTOMY       OB History    Gravida  1   Para      Term      Preterm      AB      Living        SAB      TAB      Ectopic      Multiple      Live Births               Home Medications    Prior to Admission medications   Medication Sig Start Date End Date Taking? Authorizing Provider  atorvastatin (LIPITOR) 20 MG tablet Take 20 mg by mouth daily.  06/07/19  Yes [provider]  LANTUS SOLOSTAR 100 UNIT/ML Solostar Pen Inject 40 Units into the skin at bedtime.  05/19/19  Yes [provider]  losartan-hydrochlorothiazide (HYZAAR) 50-12.5 MG tablet Take 1 tablet by mouth daily.  06/07/19  Yes [provider]  cyclobenzaprine (FLEXERIL) 10 MG tablet Take 1  tablet (10 mg total) by mouth 3 (three) times daily as needed for muscle spasms. 11/08/19   Milton Ferguson, MD  metFORMIN (GLUCOPHAGE) 500 MG tablet Take 1 tablet (500 mg total) by mouth 2 (two) times daily with a meal for 30 days. Patient not taking: Reported on 06/15/2019 04/21/19 05/21/19  Fransico Meadow, PA-C    Family History No family history on file.  Social History Social History   Tobacco Use   Smoking status: Current Every Day Smoker    Types: Cigarettes   Smokeless tobacco: Never Used   Tobacco comment: 2-3 cigarettes daily  Substance Use Topics   Alcohol use: No   Drug use: Not Currently    Types: Marijuana     Allergies   Hydrocodone   Review of Systems Review of Systems  Constitutional: Negative for appetite change and fatigue.  HENT: Negative for congestion, ear discharge and sinus pressure.   Eyes: Negative for discharge.  Respiratory: Negative for cough.   Cardiovascular: Negative for chest pain.  Gastrointestinal: Negative for abdominal pain and diarrhea.  Genitourinary: Negative for frequency and hematuria.  Musculoskeletal: Negative for  back pain.  Skin: Negative for rash.  Neurological: Positive for weakness. Negative for seizures and headaches.  Psychiatric/Behavioral: Negative for hallucinations.     Physical Exam Updated Vital Signs BP (!) 134/96    Pulse 88    Temp 98.3 F (36.8 C) (Oral)    Resp 18    Ht 5\' 8"  (1.727 m)    Wt 108 kg    LMP 10/14/2019    SpO2 100%    BMI 36.20 kg/m   Physical Exam Vitals signs and nursing note reviewed.  Constitutional:      Appearance: She is well-developed.  HENT:     Head: Normocephalic.     Mouth/Throat:     Mouth: Mucous membranes are moist.  Eyes:     General: No scleral icterus.    Conjunctiva/sclera: Conjunctivae normal.  Neck:     Musculoskeletal: Neck supple.     Thyroid: No thyromegaly.  Cardiovascular:     Rate and Rhythm: Normal rate and regular rhythm.     Heart sounds: No  murmur. No friction rub. No gallop.   Pulmonary:     Breath sounds: No stridor. No wheezing or rales.  Chest:     Chest wall: No tenderness.  Abdominal:     General: There is no distension.     Tenderness: There is no abdominal tenderness. There is no rebound.  Musculoskeletal: Normal range of motion.  Lymphadenopathy:     Cervical: No cervical adenopathy.  Skin:    Findings: No erythema or rash.  Neurological:     Mental Status: She is alert and oriented to person, place, and time.     Motor: No abnormal muscle tone.     Coordination: Coordination normal.  Psychiatric:        Behavior: Behavior normal.      ED Treatments / Results  Labs (all labs ordered are listed, but only abnormal results are displayed) Labs Reviewed  CBC WITH DIFFERENTIAL/PLATELET - Abnormal; Notable for the following components:      Result Value   Eosinophils Absolute 0.6 (*)    All other components within normal limits  COMPREHENSIVE METABOLIC PANEL - Abnormal; Notable for the following components:   Sodium 134 (*)    Glucose, Bld 409 (*)    ALT 60 (*)    All other components within normal limits  CBG MONITORING, ED - Abnormal; Notable for the following components:   Glucose-Capillary 352 (*)    All other components within normal limits    EKG None  Radiology Cta Chest For Pe  Result Date: 11/06/2019 CLINICAL DATA:  Dry cough for 1-2 months. Chest congestion for 1 month. Shortness of breath. Left-sided rib pain. EXAM: CT ANGIOGRAPHY CHEST WITH CONTRAST TECHNIQUE: Multidetector CT imaging of the chest was performed using the standard protocol during bolus administration of intravenous contrast. Multiplanar CT image reconstructions and MIPs were obtained to evaluate the vascular anatomy. CONTRAST:  14/03/2019 OMNIPAQUE IOHEXOL 350 MG/ML SOLN COMPARISON:  Chest x-ray October 03, 2019 FINDINGS: Cardiovascular: Satisfactory opacification of the pulmonary arteries to the segmental level. No evidence of  pulmonary embolism. Normal heart size. No pericardial effusion. Mediastinum/Nodes: No enlarged mediastinal, hilar, or axillary lymph nodes. Thyroid gland, trachea, and esophagus demonstrate no significant findings. Lungs/Pleura: Lungs are clear. No pleural effusion or pneumothorax. Upper Abdomen: No acute abnormality. Musculoskeletal: No chest wall abnormality. No acute or significant osseous findings. Review of the MIP images confirms the above findings. IMPRESSION: 1. No pulmonary emboli.  No acute abnormalities.  Electronically Signed   By: Gerome Samavid  Williams III M.D   On: 11/06/2019 22:47    Procedures Procedures (including critical care time)  Medications Ordered in ED Medications  sodium chloride 0.9 % bolus 1,000 mL (0 mLs Intravenous Stopped 11/08/19 1024)  ketorolac (TORADOL) 30 MG/ML injection 30 mg (30 mg Intravenous Given 11/08/19 0828)  insulin aspart (novoLOG) injection 10 Units (10 Units Subcutaneous Given 11/08/19 1026)  cyclobenzaprine (FLEXERIL) tablet 10 mg (10 mg Oral Given 11/08/19 1045)     Initial Impression / Assessment and Plan / ED Course  I have reviewed the triage vital signs and the nursing notes.  Pertinent labs & imaging results that were available during my care of the patient were reviewed by me and considered in my medical decision making (see chart for details).        Poorly controlled glucose.  Rest of labs unremarkable.  Patient feels better with the muscle relaxer.  She is sent home on Flexeril for the Covid and she will quarantine and then follow-up with her doctor about her diabetes Final Clinical Impressions(s) / ED Diagnoses   Final diagnoses:  COVID-19    ED Discharge Orders         Ordered    cyclobenzaprine (FLEXERIL) 10 MG tablet  3 times daily PRN     11/08/19 1150           Bethann BerkshireZammit, Selin Eisler, MD 11/08/19 1155

## 2019-11-10 ENCOUNTER — Encounter: Payer: Medicaid Other | Admitting: Adult Health

## 2019-11-10 ENCOUNTER — Telehealth: Payer: Self-pay | Admitting: Nurse Practitioner

## 2019-11-10 NOTE — Telephone Encounter (Signed)
Called to Discuss with patient about Covid symptoms and the use of bamlanivimab, a monoclonal antibody infusion for those with mild to moderate Covid symptoms and at a high risk of hospitalization.     Pt is qualified for this infusion at the [facility] infusion center due to co-morbid conditions and/or a member of an at-risk group.    BMI greater than 35.   Patient states that she is much improved and declines treatment at this time.

## 2020-01-01 ENCOUNTER — Encounter (HOSPITAL_COMMUNITY): Payer: Self-pay | Admitting: *Deleted

## 2020-01-01 ENCOUNTER — Other Ambulatory Visit: Payer: Self-pay

## 2020-01-01 ENCOUNTER — Emergency Department (HOSPITAL_COMMUNITY): Payer: Medicaid Other

## 2020-01-01 ENCOUNTER — Emergency Department (HOSPITAL_COMMUNITY)
Admission: EM | Admit: 2020-01-01 | Discharge: 2020-01-01 | Disposition: A | Discharge status: Home / Self Care | Payer: Medicaid Other | Attending: Emergency Medicine | Admitting: Emergency Medicine

## 2020-01-01 DIAGNOSIS — R0602 Shortness of breath: Secondary | ICD-10-CM | POA: Diagnosis not present

## 2020-01-01 DIAGNOSIS — R110 Nausea: Secondary | ICD-10-CM | POA: Insufficient documentation

## 2020-01-01 DIAGNOSIS — Z20822 Contact with and (suspected) exposure to covid-19: Secondary | ICD-10-CM | POA: Insufficient documentation

## 2020-01-01 DIAGNOSIS — F17210 Nicotine dependence, cigarettes, uncomplicated: Secondary | ICD-10-CM | POA: Diagnosis not present

## 2020-01-01 DIAGNOSIS — Z794 Long term (current) use of insulin: Secondary | ICD-10-CM | POA: Insufficient documentation

## 2020-01-01 DIAGNOSIS — B349 Viral infection, unspecified: Secondary | ICD-10-CM | POA: Diagnosis not present

## 2020-01-01 DIAGNOSIS — R739 Hyperglycemia, unspecified: Secondary | ICD-10-CM

## 2020-01-01 DIAGNOSIS — J45909 Unspecified asthma, uncomplicated: Secondary | ICD-10-CM | POA: Diagnosis not present

## 2020-01-01 DIAGNOSIS — E1165 Type 2 diabetes mellitus with hyperglycemia: Secondary | ICD-10-CM | POA: Insufficient documentation

## 2020-01-01 DIAGNOSIS — I10 Essential (primary) hypertension: Secondary | ICD-10-CM | POA: Insufficient documentation

## 2020-01-01 DIAGNOSIS — Z79899 Other long term (current) drug therapy: Secondary | ICD-10-CM | POA: Diagnosis not present

## 2020-01-01 DIAGNOSIS — Z8616 Personal history of COVID-19: Secondary | ICD-10-CM | POA: Insufficient documentation

## 2020-01-01 DIAGNOSIS — R0789 Other chest pain: Secondary | ICD-10-CM | POA: Insufficient documentation

## 2020-01-01 DIAGNOSIS — M7918 Myalgia, other site: Secondary | ICD-10-CM | POA: Insufficient documentation

## 2020-01-01 LAB — URINALYSIS, ROUTINE W REFLEX MICROSCOPIC
Bacteria, UA: NONE SEEN
Bilirubin Urine: NEGATIVE
Glucose, UA: >=500 mg/dL — AB
Hgb urine dipstick: NEGATIVE
Ketones, ur: 20 mg/dL — AB
Leukocytes,Ua: NEGATIVE
Nitrite: NEGATIVE
Protein, ur: NEGATIVE mg/dL
Specific Gravity, Urine: 1.04 — ABNORMAL HIGH (ref 1.005–1.030)
pH: 7 (ref 5.0–8.0)

## 2020-01-01 LAB — COMPREHENSIVE METABOLIC PANEL
ALT: 31 U/L (ref 0–44)
AST: 21 U/L (ref 15–41)
Albumin: 4 g/dL (ref 3.5–5.0)
Alkaline Phosphatase: 97 U/L (ref 38–126)
Anion gap: 10 (ref 5–15)
BUN: 7 mg/dL (ref 6–20)
CO2: 23 mmol/L (ref 22–32)
Calcium: 9.2 mg/dL (ref 8.9–10.3)
Chloride: 101 mmol/L (ref 98–111)
Creatinine, Ser: 0.75 mg/dL (ref 0.44–1.00)
GFR calc Af Amer: >60 mL/min (ref >60)
GFR calc non Af Amer: >60 mL/min (ref >60)
Glucose, Bld: 333 mg/dL — ABNORMAL HIGH (ref 70–99)
Potassium: 3.5 mmol/L (ref 3.5–5.1)
Sodium: 134 mmol/L — ABNORMAL LOW (ref 135–145)
Total Bilirubin: 0.9 mg/dL (ref 0.3–1.2)
Total Protein: 7.6 g/dL (ref 6.5–8.1)

## 2020-01-01 LAB — CBC WITH DIFFERENTIAL/PLATELET
Abs Immature Granulocytes: 0.03 10*3/uL (ref 0.00–0.07)
Basophils Absolute: 0.1 10*3/uL (ref 0.0–0.1)
Basophils Relative: 1 %
Eosinophils Absolute: 0.6 10*3/uL — ABNORMAL HIGH (ref 0.0–0.5)
Eosinophils Relative: 9 %
HCT: 44.4 % (ref 36.0–46.0)
Hemoglobin: 14.6 g/dL (ref 12.0–15.0)
Immature Granulocytes: 1 %
Lymphocytes Relative: 30 %
Lymphs Abs: 1.9 10*3/uL (ref 0.7–4.0)
MCH: 30.2 pg (ref 26.0–34.0)
MCHC: 32.9 g/dL (ref 30.0–36.0)
MCV: 91.7 fL (ref 80.0–100.0)
Monocytes Absolute: 0.4 10*3/uL (ref 0.1–1.0)
Monocytes Relative: 7 %
Neutro Abs: 3.3 10*3/uL (ref 1.7–7.7)
Neutrophils Relative %: 52 %
Platelets: 359 10*3/uL (ref 150–400)
RBC: 4.84 MIL/uL (ref 3.87–5.11)
RDW: 12.5 % (ref 11.5–15.5)
WBC: 6.2 10*3/uL (ref 4.0–10.5)
nRBC: 0 % (ref 0.0–0.2)

## 2020-01-01 LAB — CBG MONITORING, ED
Glucose-Capillary: 448 mg/dL — ABNORMAL HIGH (ref 70–99)
Glucose-Capillary: 545 mg/dL (ref 70–99)
Glucose-Capillary: 415 mg/dL — ABNORMAL HIGH (ref 70–99)

## 2020-01-01 LAB — LIPASE, BLOOD: Lipase: 30 U/L (ref 11–51)

## 2020-01-01 LAB — D-DIMER, QUANTITATIVE: D-Dimer, Quant: 0.28 ug/mL-FEU (ref 0.00–0.50)

## 2020-01-01 LAB — POC URINE PREG, ED: Preg Test, Ur: NEGATIVE

## 2020-01-01 LAB — POC SARS CORONAVIRUS 2 AG -  ED: SARS Coronavirus 2 Ag: NEGATIVE

## 2020-01-01 MED ORDER — INSULIN ASPART 100 UNIT/ML ~~LOC~~ SOLN
10.0000 [IU] | Freq: Once | SUBCUTANEOUS | Status: AC
  Administered 2020-01-01: 10 [IU] via SUBCUTANEOUS
  Filled 2020-01-01: qty 1

## 2020-01-01 MED ORDER — KETOROLAC TROMETHAMINE 30 MG/ML IJ SOLN
15.0000 mg | Freq: Once | INTRAMUSCULAR | Status: AC
  Administered 2020-01-01: 15 mg via INTRAVENOUS
  Filled 2020-01-01: qty 1

## 2020-01-01 MED ORDER — MORPHINE SULFATE (PF) 4 MG/ML IV SOLN
4.0000 mg | Freq: Once | INTRAVENOUS | Status: AC
  Administered 2020-01-01: 4 mg via INTRAVENOUS
  Filled 2020-01-01: qty 1

## 2020-01-01 MED ORDER — ALUM & MAG HYDROXIDE-SIMETH 200-200-20 MG/5ML PO SUSP
30.0000 mL | Freq: Once | ORAL | Status: AC
  Administered 2020-01-01: 30 mL via ORAL
  Filled 2020-01-01: qty 30

## 2020-01-01 MED ORDER — SODIUM CHLORIDE 0.9 % IV BOLUS
1000.0000 mL | Freq: Once | INTRAVENOUS | Status: AC
  Administered 2020-01-01: 1000 mL via INTRAVENOUS

## 2020-01-01 MED ORDER — ONDANSETRON HCL 4 MG/2ML IJ SOLN
4.0000 mg | Freq: Once | INTRAMUSCULAR | Status: AC
  Administered 2020-01-01: 21:00:00 4 mg via INTRAVENOUS
  Filled 2020-01-01: qty 2

## 2020-01-01 MED ORDER — ONDANSETRON HCL 4 MG/2ML IJ SOLN
4.0000 mg | Freq: Once | INTRAMUSCULAR | Status: AC
  Administered 2020-01-01: 4 mg via INTRAVENOUS
  Filled 2020-01-01: qty 2

## 2020-01-01 NOTE — ED Provider Notes (Signed)
[facility] EMERGENCY DEPARTMENT Provider Note   CSN: [id number] Arrival date & time: 01/01/20  1556     History Chief Complaint  Patient presents with  . Hyperglycemia    Danielle Zimmerman is a 29 y.o. female with history of asthma, diabetes, GERD, hypertension and was diagnosed with COVID-19 on 11/07/2019 presenting with a 1 day history of multiple complaints including nonproductive cough, chest pressure with shortness of breath along with nausea without emesis, several episodes of diarrhea and generalized abdominal pain.  She has had poor p.o. intake for the past 24 hours.  She also endorses hot flashes but has had no documented fevers.  She states her symptoms are similar to her last episode of Covid 19 last month.  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  It is noted that her CBG today is 445.  She states this is not an unusual blood glucose reading for her.  She does not think her symptoms are related to her hyperglycemia.  Additionally, she is actively trying to get pregnant and is currently 10 days late with her menses, although reports an irregular cycle and a home pregnancy test yesterday was negative.  She has taken Tylenol for her symptoms with equivocal relief.  The history is provided by the patient.       Past Medical History:  Diagnosis Date  . Asthma   . Chronic back pain   . Diabetes mellitus without complication ([facility]) 05/2019  . GERD (gastroesophageal reflux disease)   . Hypertension   . Kidney stone   . Nausea and vomiting    recurrent  . Recurrent abdominal pain   . Seasonal allergies     There are no problems to display for this patient.   Past Surgical History:  Procedure Laterality Date  . CESAREAN SECTION    . TONSILLECTOMY AND ADENOIDECTOMY       OB History    Gravida  1   Para      Term      Preterm      AB      Living        SAB      TAB      Ectopic      Multiple      Live  Births              No family history on file.  Social History   Tobacco Use  . Smoking status: Current Every Day Smoker    Types: Cigarettes  . Smokeless tobacco: Never Used  . Tobacco comment: 2-3 cigarettes daily  Substance Use Topics  . Alcohol use: No  . Drug use: Not Currently    Types: Marijuana    Home Medications Prior to Admission medications   Medication Sig Start Date End Date Taking? Authorizing Provider  atorvastatin (LIPITOR) 20 MG tablet Take 20 mg by mouth daily.  06/07/19  Yes [provider]  cyclobenzaprine (FLEXERIL) 10 MG tablet Take 1 tablet (10 mg total) by mouth 3 (three) times daily as needed for muscle spasms. 11/08/19  Yes Bethann Berkshire, MD  LANTUS SOLOSTAR 100 UNIT/ML Solostar Pen Inject 40 Units into the skin at bedtime.  05/19/19  Yes [provider]  losartan-hydrochlorothiazide (HYZAAR) 50-12.5 MG tablet Take 1 tablet by mouth daily.  06/07/19  Yes [provider]  metFORMIN (GLUCOPHAGE) 500 MG tablet Take 1 tablet (500 mg total) by mouth 2 (two) times daily  with a meal for 30 days. Patient not taking: Reported on 06/15/2019 04/21/19 05/21/19  Elson Areas, PA-C    Allergies    Hydrocodone  Review of Systems   Review of Systems  Constitutional: Positive for appetite change and fever.  HENT: Negative for congestion and sore throat.   Eyes: Negative.   Respiratory: Positive for chest tightness and shortness of breath.   Cardiovascular: Negative for chest pain.  Gastrointestinal: Positive for abdominal pain, diarrhea and nausea. Negative for vomiting.  Genitourinary: Negative.   Musculoskeletal: Positive for myalgias. Negative for arthralgias, joint swelling and neck pain.  Skin: Negative.  Negative for rash and wound.  Neurological: Negative for dizziness, weakness, light-headedness, numbness and headaches.  Psychiatric/Behavioral: Negative.     Physical Exam Updated Vital Signs BP 120/74   Pulse 98   Temp  98.6 F (37 C) (Oral)   Resp 18   Ht 5\' 8"  (1.727 m)   Wt 103.1 kg   LMP 11/17/2019   SpO2 99%   BMI 34.56 kg/m   Physical Exam Vitals and nursing note reviewed.  Constitutional:      Appearance: She is well-developed.  HENT:     Head: Normocephalic and atraumatic.  Eyes:     Conjunctiva/sclera: Conjunctivae normal.  Cardiovascular:     Rate and Rhythm: Normal rate and regular rhythm.     Heart sounds: Normal heart sounds.  Pulmonary:     Effort: Pulmonary effort is normal.     Breath sounds: Normal breath sounds. No wheezing.  Chest:     Comments: Chest wall nontender to palpation. Abdominal:     General: Bowel sounds are normal. There is no distension.     Palpations: Abdomen is soft.     Tenderness: There is no abdominal tenderness. There is no guarding or rebound.     Comments: abd soft, normoactive bs,  No guarding.   Musculoskeletal:        General: Normal range of motion.     Cervical back: Normal range of motion.  Skin:    General: Skin is warm and dry.  Neurological:     General: No focal deficit present.     Mental Status: She is alert.     ED Results / Procedures / Treatments   Labs (all labs ordered are listed, but only abnormal results are displayed) Labs Reviewed  CBC WITH DIFFERENTIAL/PLATELET - Abnormal; Notable for the following components:      Result Value   Eosinophils Absolute 0.6 (*)    All other components within normal limits  COMPREHENSIVE METABOLIC PANEL - Abnormal; Notable for the following components:   Sodium 134 (*)    Glucose, Bld 333 (*)    All other components within normal limits  URINALYSIS, ROUTINE W REFLEX MICROSCOPIC - Abnormal; Notable for the following components:   APPearance HAZY (*)    Specific Gravity, Urine 1.040 (*)    Glucose, UA >=500 (*)    Ketones, ur 20 (*)    All other components within normal limits  CBG MONITORING, ED - Abnormal; Notable for the following components:   Glucose-Capillary 448 (*)    All  other components within normal limits  CBG MONITORING, ED - Abnormal; Notable for the following components:   Glucose-Capillary 545 (*)    All other components within normal limits  CBG MONITORING, ED - Abnormal; Notable for the following components:   Glucose-Capillary 415 (*)    All other components within normal limits  SARS CORONAVIRUS 2 (  TAT 6-24 HRS)  LIPASE, BLOOD  D-DIMER, QUANTITATIVE (NOT AT [facility])  POC URINE PREG, ED  POC SARS CORONAVIRUS 2 AG -  ED    EKG None  Radiology DG Chest Portable 1 View  Result Date: 01/01/2020 CLINICAL DATA:  Short of breath, cough EXAM: PORTABLE CHEST 1 VIEW COMPARISON:  10/03/2019 FINDINGS: The heart size and mediastinal contours are within normal limits. Both lungs are clear. The visualized skeletal structures are unremarkable. IMPRESSION: No active disease. Electronically Signed   By: Jasmine Pang M.D.   On: 01/01/2020 17:04    Procedures Procedures (including critical care time)  Medications Ordered in ED Medications  sodium chloride 0.9 % bolus 1,000 mL (0 mLs Intravenous Stopped 01/01/20 1854)  ondansetron (ZOFRAN) injection 4 mg (4 mg Intravenous Given 01/01/20 1724)  ketorolac (TORADOL) 30 MG/ML injection 15 mg (15 mg Intravenous Given 01/01/20 1725)  insulin aspart (novoLOG) injection 10 Units (10 Units Subcutaneous Given 01/01/20 1726)  sodium chloride 0.9 % bolus 1,000 mL (0 mLs Intravenous Stopped 01/01/20 2037)  insulin aspart (novoLOG) injection 10 Units (10 Units Subcutaneous Given 01/01/20 1859)  alum & mag hydroxide-simeth (MAALOX/MYLANTA) 200-200-20 MG/5ML suspension 30 mL (30 mLs Oral Given 01/01/20 1947)  morphine 4 MG/ML injection 4 mg (4 mg Intravenous Given 01/01/20 2030)  ondansetron (ZOFRAN) injection 4 mg (4 mg Intravenous Given 01/01/20 2030)    ED Course  I have reviewed the triage vital signs and the nursing notes.  Pertinent labs & imaging results that were available during my care of the patient were reviewed by  me and considered in my medical decision making (see chart for details).    MDM Rules/Calculators/A&P                      Pt with multiple complaints, main concerns about possible return of Covid with a new exposure, although tested positive on 12/5, so low risk for repeat infection.  Her POC covid was negative.  Her PCR is pending, advised home quarantine until this test results.  D dimer negative, perc negative, doubt PE.  No pneumonia.   Secondly, concerned re hyperglycemia, although states her home cbg's at baseline range 400-500 range.  Denies any skipped doses of her medications.  She was diagnosed with DM last summer by pcp, has not had dietary instruction and expresses frustration over her lack of knowledge about this illness.  While here, her cbg was responding to the IV fluids and SQ insulin aspart given.  Unfortunately, discovered by RN that she drank a non diet Cheerwine and ate a [employer] meal while here - her repeat cbg elevated again.  Given another IV bolus dose and insulin SQ - last cbg check improving but still elevated.  No dka.  Pt given general guideline info for dietary choices, but also order placed for hospital visit with nutritionist.   The patient appears reasonably screened and/or stabilized for discharge and I doubt any other medical condition or other [facility] requiring further screening, evaluation, or treatment in the ED at this time prior to discharge.    Danielle Zimmerman was evaluated in Emergency Department on 01/02/2020 for the symptoms described in the history of present illness. She was evaluated in the context of the global COVID-19 pandemic, which necessitated consideration that the patient might be at risk for infection with the SARS-CoV-2 virus that causes COVID-19. Institutional protocols and algorithms that pertain to the evaluation of patients at risk for COVID-19 are in a state of  rapid change based on information released by regulatory bodies including the CDC  and federal and state organizations. These policies and algorithms were followed during the patient's care in the ED.  Final Clinical Impression(s) / ED Diagnoses Final diagnoses:  Hyperglycemia  Viral syndrome    Rx / DC Orders ED Discharge Orders         Ordered    Ambulatory referral to Nutrition and Diabetic Education     01/01/20 2120           Evalee Jefferson, PA-C 01/02/20 1750    Veryl Speak, MD 01/04/20 1505

## 2020-01-01 NOTE — ED Notes (Signed)
Upon entering room for shift assessment to meet Pt, Pts CBG found to be higher than previously measured after receiving 10 units of Insulin. Pt admitted to be drinking Cheerwine and eating food in room. Provider notified and fluids to be re-ordered with more Insulin. Will recheck CBG after an hour.

## 2020-01-01 NOTE — ED Triage Notes (Addendum)
Pt c/o multiple complaints, reports that she was exposed to covid for the second time this year and is feeling the same way as when she was positive for covid before, pt c/o of  Body aches,  Chest pain Dizziness Sob Nausea abd pain Diarrhea Cough Elevated blood sugar Feeling shaky Also concerned with possibility of being pregnant

## 2020-01-01 NOTE — Discharge Instructions (Signed)
Your blood glucose level remains elevated tonight but is improving after the second dose of insulin. You are sabotaging your health by not being more careful with your food and drink intake.  You really need to avoid sugary drinks such as the cheerwine you were drinking here. Please refer to the information below about nutritional choices for your diabetes.  You would also benefit from classes with a nutritionist if your primary MD has not arranged this with you.  I have referred you to see a nutritionist here at [facility] for this service.  You have been screened for Covid today, the rapid test is negative and will be confirmed by a send out test which should result tomorrow. In the interim, I advise home quarantine until this test is aback and is negative.  (As discussed, given you had Covid early December,  I suspect this test will be negative).

## 2020-01-02 LAB — SARS CORONAVIRUS 2 (TAT 6-24 HRS): SARS Coronavirus 2: NEGATIVE

## 2020-01-28 ENCOUNTER — Ambulatory Visit: Payer: Medicaid Other | Admitting: Nutrition

## 2020-03-12 ENCOUNTER — Emergency Department (HOSPITAL_COMMUNITY)
Admission: EM | Admit: 2020-03-12 | Discharge: 2020-03-12 | Disposition: A | Discharge status: Home / Self Care | Payer: Medicaid Other | Attending: Emergency Medicine | Admitting: Emergency Medicine

## 2020-03-12 ENCOUNTER — Encounter (HOSPITAL_COMMUNITY): Payer: Self-pay | Admitting: Emergency Medicine

## 2020-03-12 ENCOUNTER — Other Ambulatory Visit: Payer: Self-pay

## 2020-03-12 DIAGNOSIS — E119 Type 2 diabetes mellitus without complications: Secondary | ICD-10-CM | POA: Diagnosis not present

## 2020-03-12 DIAGNOSIS — I10 Essential (primary) hypertension: Secondary | ICD-10-CM | POA: Insufficient documentation

## 2020-03-12 DIAGNOSIS — Z79899 Other long term (current) drug therapy: Secondary | ICD-10-CM | POA: Diagnosis not present

## 2020-03-12 DIAGNOSIS — J45909 Unspecified asthma, uncomplicated: Secondary | ICD-10-CM | POA: Insufficient documentation

## 2020-03-12 DIAGNOSIS — F4321 Adjustment disorder with depressed mood: Secondary | ICD-10-CM | POA: Diagnosis not present

## 2020-03-12 DIAGNOSIS — Z794 Long term (current) use of insulin: Secondary | ICD-10-CM | POA: Insufficient documentation

## 2020-03-12 DIAGNOSIS — F419 Anxiety disorder, unspecified: Secondary | ICD-10-CM | POA: Insufficient documentation

## 2020-03-12 DIAGNOSIS — F17210 Nicotine dependence, cigarettes, uncomplicated: Secondary | ICD-10-CM | POA: Insufficient documentation

## 2020-03-12 MED ORDER — SODIUM CHLORIDE 0.9 % IV BOLUS
1000.0000 mL | Freq: Once | INTRAVENOUS | Status: AC
  Administered 2020-03-12: 13:00:00 1000 mL via INTRAVENOUS

## 2020-03-12 MED ORDER — HYDROXYZINE HCL 25 MG PO TABS: 25.0000 mg | ORAL_TABLET | Freq: Four times a day (QID) | ORAL | 0 refills | Status: DC | PRN

## 2020-03-12 MED ORDER — LORAZEPAM 2 MG/ML IJ SOLN
1.0000 mg | Freq: Once | INTRAMUSCULAR | Status: AC
  Administered 2020-03-12: 13:00:00 1 mg via INTRAVENOUS
  Filled 2020-03-12: qty 1

## 2020-03-12 MED ORDER — HYDROXYZINE HCL 25 MG PO TABS
25.0000 mg | ORAL_TABLET | Freq: Once | ORAL | Status: AC
  Administered 2020-03-12: 13:00:00 25 mg via ORAL
  Filled 2020-03-12: qty 1

## 2020-03-12 NOTE — ED Provider Notes (Signed)
[facility] EMERGENCY DEPARTMENT Provider Note   CSN: [id number] Arrival date & time: 03/12/20  1141     History Chief Complaint  Patient presents with  . Anxiety    Danielle Zimmerman is a 29 y.o. female.  Danielle Zimmerman is a 29 y.o. female with a history of asthma, hypertension, diabetes, seasonal allergies, GERD, who presents to the emergency department for evaluation of anxiety.  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  She states this was very scary she was not sure if the area was safe for what had happened.  She reports since then she has been feeling very anxious and panicked.  She reports she frequently thinks of him and pictures him laying on the front porch.  She states that she thought that if she could get through the day it would get better, but states that for the past 2 days she has been feeling extremely anxious.  She states that she feels some tightness in her chest that is worse when she is breathing quickly.  She is able to slow her breathing down, but the anxiety keeps coming back.  She states she keeps picturing her clients son on the front porch, and sometimes she thinks she hears his voice.  She has a history of anxiety and depression when she was much younger but is not currently on any medication for this and has not had issues with it until this recent event.  Aside from chest tightness when she is feeling anxious she denies shortness of breath or chest pain.  Denies abdominal pain, nausea or vomiting.  No leg pain or swelling.  No other aggravating or alleviating factors. Denies any thoughts of hurting herself or others.        Past Medical History:  Diagnosis Date  . Asthma   . Chronic back pain   . Diabetes mellitus without complication (HCC) 05/2019  . GERD (gastroesophageal reflux disease)   . Hypertension   . Kidney stone   . Nausea and vomiting    recurrent   . Recurrent abdominal pain   . Seasonal allergies     There are no problems to display for this patient.   Past Surgical History:  Procedure Laterality Date  . CESAREAN SECTION    . TONSILLECTOMY AND ADENOIDECTOMY       OB History    Gravida  1   Para      Term      Preterm      AB      Living        SAB      TAB      Ectopic      Multiple      Live Births              No family history on file.  Social History   Tobacco Use  . Smoking status: Current Every Day Smoker    Packs/day: 0.50    Years: 10.00    Pack years: 5.00    Types: Cigarettes  . Smokeless tobacco: Never Used  . Tobacco comment: 2-3 cigarettes daily  Substance Use Topics  . Alcohol use: No  . Drug use: Not Currently    Types: Marijuana    Home Medications Prior to Admission medications   Medication Sig Start Date End Date Taking? Authorizing Provider  atorvastatin (LIPITOR) 20  MG tablet Take 20 mg by mouth daily.  06/07/19  Yes [provider]  LANTUS SOLOSTAR 100 UNIT/ML Solostar Pen Inject 40 Units into the skin at bedtime.  05/19/19  Yes [provider]  losartan-hydrochlorothiazide (HYZAAR) 50-12.5 MG tablet Take 1 tablet by mouth daily.  06/07/19  Yes [provider]  metFORMIN (GLUCOPHAGE) 500 MG tablet Take 1 tablet (500 mg total) by mouth 2 (two) times daily with a meal for 30 days. 04/21/19 03/12/20 Yes Fransico Meadow, PA-C  cyclobenzaprine (FLEXERIL) 10 MG tablet Take 1 tablet (10 mg total) by mouth 3 (three) times daily as needed for muscle spasms. Patient not taking: Reported on 03/12/2020 11/08/19   Milton Ferguson, MD    Allergies    Hydrocodone  Review of Systems   Review of Systems  Constitutional: Negative for chills and fever.  HENT: Negative.   Respiratory: Positive for chest tightness. Negative for cough and shortness of breath.   Cardiovascular: Negative for chest pain and leg swelling.  Gastrointestinal: Negative for abdominal  pain, nausea and vomiting.  Musculoskeletal: Negative for arthralgias and myalgias.  Skin: Negative for color change and rash.  Neurological: Negative for dizziness, syncope and light-headedness.  Psychiatric/Behavioral: The patient is nervous/anxious.   All other systems reviewed and are negative.   Physical Exam Updated Vital Signs BP (!) 141/96 (BP Location: Right Arm)   Pulse (!) 123   Temp 98.4 F (36.9 C) (Oral)   Resp 20   Ht 5\' 7"  (1.702 m)   Wt 96.2 kg   LMP 03/04/2020 (Approximate)   SpO2 99%   BMI 33.20 kg/m   Physical Exam Vitals and nursing note reviewed.  Constitutional:      General: She is not in acute distress.    Appearance: Normal appearance. She is well-developed. She is not diaphoretic.     Comments: Patient appears anxious, she is tearful  HENT:     Head: Normocephalic and atraumatic.  Eyes:     General:        Right eye: No discharge.        Left eye: No discharge.  Cardiovascular:     Rate and Rhythm: Regular rhythm. Tachycardia present.     Pulses: Normal pulses.     Heart sounds: Normal heart sounds. No murmur. No friction rub. No gallop.   Pulmonary:     Effort: Pulmonary effort is normal. No respiratory distress.     Breath sounds: Normal breath sounds. No wheezing or rales.     Comments: Respirations equal and unlabored, patient able to speak in full sentences, lungs clear to auscultation bilaterally Chest:     Chest wall: No tenderness.  Abdominal:     General: Bowel sounds are normal. There is no distension.     Palpations: Abdomen is soft. There is no mass.     Tenderness: There is no abdominal tenderness. There is no guarding.     Comments: Abdomen soft, nondistended, nontender to palpation in all quadrants without guarding or peritoneal signs  Musculoskeletal:        General: No deformity.     Cervical back: Neck supple.  Skin:    General: Skin is warm and dry.     Capillary Refill: Capillary refill takes less than 2 seconds.    Neurological:     Mental Status: She is alert.     Coordination: Coordination normal.     Comments: Speech is clear, able to follow commands Moves extremities without ataxia, coordination intact  Psychiatric:        Mood and Affect: Mood is anxious. Affect is tearful.        Speech: Speech normal.        Behavior: Behavior normal.        Thought Content: Thought content does not include homicidal or suicidal ideation. Thought content does not include homicidal or suicidal plan.     ED Results / Procedures / Treatments   Labs (all labs ordered are listed, but only abnormal results are displayed) Labs Reviewed - No data to display  EKG EKG Interpretation  Date/Time:  Saturday March 12 2020 15:00:32 EDT Ventricular Rate:  95 PR Interval:  144 QRS Duration: 80 QT Interval:  360 QTC Calculation: 452 R Axis:   105 Text Interpretation: Normal sinus rhythm Rightward axis Borderline ECG Similar to Dec 2020 tracing No STEMI Confirmed by Alona Bene [phone]) on 03/12/2020 3:22:32 PM   Radiology No results found.  Procedures Procedures (including critical care time)  Medications Ordered in ED Medications  sodium chloride 0.9 % bolus 1,000 mL (0 mLs Intravenous Stopped 03/12/20 1526)  LORazepam (ATIVAN) injection 1 mg (1 mg Intravenous Given 03/12/20 1252)  hydrOXYzine (ATARAX/VISTARIL) tablet 25 mg (25 mg Oral Given 03/12/20 1252)    ED Course  I have reviewed the triage vital signs and the nursing notes.  Pertinent labs & imaging results that were available during my care of the patient were reviewed by me and considered in my medical decision making (see chart for details).    MDM Rules/Calculators/A&P                     Patient presents with anxiety and panic attacks after traumatic event where she found her clients son down on the front porch when she arrived at the house for work on Wednesday.  On arrival she is tachycardic and appears very anxious.  She reports chest  tightness that is only present when she is feeling anxious and panicked.  On arrival she is tachycardic but vitals are otherwise normal.  Patient is well-appearing.  She has no thoughts of hurting herself or others.  Will treat with IV fluids, Ativan, hydroxyzine and then reevaluate.  Will get EKG.  Given that patient has specific inciting event to cause anxiety, I feel this is more related with her symptoms and that this is unlikely due to a organic problem with the heart or lungs.  EKG shows normal sinus rhythm with no acute ischemic changes, no arrhythmia.  After medications patient has finally been able to sleep for the first time in a few days.  She states her anxiety is significantly improved and she is feeling better.  Her heart rate has returned to normal.  I have stressed to patient the importance of following up with a counselor to talk about this traumatic event.  She expresses understanding and agreement with this plan.  Counseling resources provided.  Patient will be prescribed hydroxyzine.  She expresses understanding and agreement with plan.  BP 113/76 (BP Location: Right Arm)   Pulse 88   Temp 97.7 F (36.5 C) (Oral)   Resp 12   Ht 5\' 7"  (1.702 m)   Wt 96.2 kg   LMP 03/04/2020 (Approximate)   SpO2 99%   BMI 33.20 kg/m   Final Clinical Impression(s) / ED Diagnoses Final diagnoses:  Anxiety  Grief reaction    Rx / DC Orders ED Discharge Orders    None  Dartha Lodge, PA-C 03/12/20 1559    Vanetta Mulders, MD 03/13/20 [phone]

## 2020-03-12 NOTE — Discharge Instructions (Signed)
Please use prescribed medication to help with anxiety.  It is very important that you use the counseling resources provided, to find someone to talk to about this to help you with your grief and anxiety.  There are even online free counseling apps available on your phone that you can try.  I would also like for you to follow-up with the primary care doctor.  [facility] Primary Care Doctor List   Syliva Overman, MD. Specialty: [facility] Medicine Contact information: [address]  [place] [address]  [phone]   Lilyan Punt, MD. Specialty: [facility] Medicine Contact information: [address]  [place] [address]  [phone]   Avon Gully, MD Specialty: Internal Medicine Contact information: [address] [place] [address]  [phone]   Catalina Pizza, MD. Specialty: Internal Medicine Contact information: [address]  [place] [address]  [phone]    [facility] (Dr. Selena Batten) Specialty: Family Medicine Contact information: [address]  [place] [address]  [phone]   John Giovanni, MD. Specialty: [facility] Medicine Contact information: [address]  [address]  [place] [address]  [phone]   Carylon Perches, MD. Specialty: Internal Medicine Contact information: [address]  [address]  [place] [address]  [phone]    [facility]  [address] [place], [place] [address] [phone]  Services [facility] offers a variety of basic health services.  Services include but are not limited to: Blood pressure checks  Heart rate checks  Blood sugar checks  Urine analysis  Rapid strep tests  Pregnancy tests.  Health education and referrals  People needing more complex services will be directed to a physician online. Using these virtual visits, doctors can evaluate and prescribe medicine and treatments. There will be  no medication on-site, though [facility] will help patients fill their prescriptions at little to no cost.   For More information please go to: [email or web address]

## 2020-03-12 NOTE — ED Triage Notes (Signed)
Pt c/o anxiety that started on 03/09/20 when she found her client expired. She is employed as Microbiologist. Patient denies SI/HI

## 2020-03-24 ENCOUNTER — Encounter: Payer: Self-pay | Admitting: Nurse Practitioner

## 2020-03-24 ENCOUNTER — Other Ambulatory Visit: Payer: Self-pay

## 2020-03-24 ENCOUNTER — Ambulatory Visit (INDEPENDENT_AMBULATORY_CARE_PROVIDER_SITE_OTHER): Payer: Medicaid Other | Admitting: Nurse Practitioner

## 2020-03-24 VITALS — BP 128/88 | HR 84 | Temp 97.6°F | Resp 18 | Ht 68.0 in | Wt 210.4 lb

## 2020-03-24 DIAGNOSIS — Z13228 Encounter for screening for other metabolic disorders: Secondary | ICD-10-CM

## 2020-03-24 DIAGNOSIS — Z6831 Body mass index (BMI) 31.0-31.9, adult: Secondary | ICD-10-CM

## 2020-03-24 DIAGNOSIS — E1169 Type 2 diabetes mellitus with other specified complication: Secondary | ICD-10-CM | POA: Diagnosis not present

## 2020-03-24 DIAGNOSIS — F4310 Post-traumatic stress disorder, unspecified: Secondary | ICD-10-CM

## 2020-03-24 DIAGNOSIS — F419 Anxiety disorder, unspecified: Secondary | ICD-10-CM

## 2020-03-24 DIAGNOSIS — E785 Hyperlipidemia, unspecified: Secondary | ICD-10-CM

## 2020-03-24 DIAGNOSIS — R000 Tachycardia, unspecified: Secondary | ICD-10-CM

## 2020-03-24 DIAGNOSIS — I10 Essential (primary) hypertension: Secondary | ICD-10-CM | POA: Diagnosis not present

## 2020-03-24 DIAGNOSIS — F329 Major depressive disorder, single episode, unspecified: Secondary | ICD-10-CM

## 2020-03-24 MED ORDER — LOSARTAN POTASSIUM-HCTZ 50-12.5 MG PO TABS: 1.0000 | ORAL_TABLET | Freq: Every day | ORAL | 6 refills | Status: AC

## 2020-03-24 MED ORDER — NEBIVOLOL HCL 5 MG PO TABS
5.0000 mg | ORAL_TABLET | Freq: Every day | ORAL | Status: AC
  Administered 2020-03-24: 5 mg via ORAL

## 2020-03-24 MED ORDER — LANTUS SOLOSTAR 100 UNIT/ML ~~LOC~~ SOPN: 40.0000 [IU] | PEN_INJECTOR | Freq: Every day | SUBCUTANEOUS | 6 refills | Status: AC

## 2020-03-24 MED ORDER — ATORVASTATIN CALCIUM 20 MG PO TABS: 20.0000 mg | ORAL_TABLET | Freq: Every day | ORAL | 6 refills | Status: AC

## 2020-03-24 MED ORDER — PAROXETINE HCL 10 MG PO TABS: 10.0000 mg | ORAL_TABLET | Freq: Every day | ORAL | 0 refills | Status: DC

## 2020-03-24 MED ORDER — ALPRAZOLAM 0.25 MG PO TABS: 0.2500 mg | ORAL_TABLET | Freq: Two times a day (BID) | ORAL | 0 refills | Status: DC | PRN

## 2020-03-24 MED ORDER — METFORMIN HCL 500 MG PO TABS: 500.0000 mg | ORAL_TABLET | Freq: Two times a day (BID) | ORAL | 6 refills | Status: DC

## 2020-03-24 MED ORDER — ACCU-CHEK SOFT TOUCH LANCETS MISC: 1.0000 | 2 refills | Status: DC | PRN

## 2020-03-24 NOTE — Patient Instructions (Addendum)
Established care today Tachycardia and elevated blood pressure off of medications: bystolic 5 mg administered in clinc bringing HR/BP down. Refill medications PTSD anxiety/depression: psychiatry referral, initiated SSRI and Xanax follow up in 4 weeks if not completed apt with psychiatry.  Labs today.  Endocrinology Referral r/t DM diagnosis 2 years ago with A1C reported >7 non controlled. Evaluate DM 1 or 15 with 29 year old pt.  Continue weight loss lower calorie increase exercise.

## 2020-03-24 NOTE — Progress Notes (Addendum)
New Patient Office Visit  Subjective:  Patient ID: Danielle Zimmerman, female    DOB: 12/30/1990  Age: 29 y.o. MRN: [id number]  CC:  Chief Complaint  Patient presents with  . Establish Care    NP    HPI Danielle Zimmerman is a 29 year old female presenting to establish care. She reports she weighed near 400lbs a few years ago and that she reduced calories, exercise and chasing around her toddler child.   She has elevated HR and blood pressure while she admits to running out of her HTN medication she does report that she founds her neighbors son  "dead" on front porch, she was seen in the ER and told she may have ptsd. She admits to anxiety over the situation with insomnia and depression without suicidal harm to self or other ideations.  Labs today, refill on all medications for htn, dm, hyperlipidemia. Initiate tx for ptsd, anxiety/depression.  Discussed continued work towards weight loss with your exercise and reduced calories, daily walks.  Bystolic given in clinic 5 mg and 10 minutes later reduced to acceptable range for departure from office.  Past Medical History:  Diagnosis Date  . Asthma   . Chronic back pain   . Diabetes mellitus without complication (HCC) 05/2019  . GERD (gastroesophageal reflux disease)   . Hypertension   . Kidney stone   . Nausea and vomiting    recurrent  . Recurrent abdominal pain   . Seasonal allergies     Past Surgical History:  Procedure Laterality Date  . CESAREAN SECTION    . TONSILLECTOMY AND ADENOIDECTOMY      History reviewed. No pertinent family history.  Social History   Socioeconomic History  . Marital status: Single    Spouse name: Not on file  . Number of children: Not on file  . Years of education: Not on file  . Highest education level: Not on file  Occupational History  . Not on file  Tobacco Use  . Smoking status: Current Every Day Smoker    Packs/day: 0.50    Years: 10.00    Pack years: 5.00    Types: Cigarettes    . Smokeless tobacco: Never Used  . Tobacco comment: 2-3 cigarettes daily  Substance and Sexual Activity  . Alcohol use: No  . Drug use: Not Currently    Types: Marijuana  . Sexual activity: Yes  Other Topics Concern  . Not on file  Social History Narrative  . Not on file   Social Determinants of Health   Financial Resource Strain:   . Difficulty of Paying Living Expenses:   Food Insecurity:   . Worried About Programme researcher, broadcasting/film/video in the Last Year:   . Barista in the Last Year:   Transportation Needs:   . Freight forwarder (Medical):   Marland Kitchen Lack of Transportation (Non-Medical):   Physical Activity:   . Days of Exercise per Week:   . Minutes of Exercise per Session:   Stress:   . Feeling of Stress :   Social Connections:   . Frequency of Communication with Friends and Family:   . Frequency of Social Gatherings with Friends and Family:   . Attends Religious Services:   . Active Member of Clubs or Organizations:   . Attends Banker Meetings:   Marland Kitchen Marital Status:   Intimate Partner Violence:   . Fear of Current or Ex-Partner:   . Emotionally Abused:   .  Physically Abused:   . Sexually Abused:     ROS Review of Systems  All other systems reviewed and are negative.   Objective:   Today's Vitals: BP 128/88 (BP Location: Left Arm, Patient Position: Sitting, Cuff Size: Large)   Pulse 84   Temp 97.6 F (36.4 C) (Temporal)   Resp 18   Ht 5\' 8"  (1.727 m)   Wt 210 lb 6.4 oz (95.4 kg)   LMP 03/04/2020 (Approximate)   SpO2 96%   BMI 31.99 kg/m   Physical Exam Vitals and nursing note reviewed.  Constitutional:      Appearance: Normal appearance. She is well-developed and well-groomed.  HENT:     Head: Normocephalic.     Right Ear: Hearing and external ear normal.     Left Ear: Hearing and external ear normal.     Nose: Nose normal.     Mouth/Throat:     Lips: Pink.     Mouth: Mucous membranes are moist.     Pharynx: Oropharynx is clear.   Eyes:     General: Lids are normal. Lids are everted, no foreign bodies appreciated.     Extraocular Movements: Extraocular movements intact.     Conjunctiva/sclera: Conjunctivae normal.     Pupils: Pupils are equal, round, and reactive to light.  Neck:     Thyroid: No thyroid mass, thyromegaly or thyroid tenderness.     Vascular: No carotid bruit or JVD.  Cardiovascular:     Rate and Rhythm: Regular rhythm. Tachycardia present.     Pulses: Normal pulses.     Heart sounds: Normal heart sounds, S1 normal and S2 normal.  Pulmonary:     Effort: Pulmonary effort is normal.     Breath sounds: Normal breath sounds.  Abdominal:     General: Abdomen is flat. Bowel sounds are normal. There is no abdominal bruit.     Palpations: Abdomen is soft.  Musculoskeletal:        General: Normal range of motion.     Cervical back: Full passive range of motion without pain, normal range of motion and neck supple.     Right lower leg: No edema.     Left lower leg: No edema.  Lymphadenopathy:     Cervical: No cervical adenopathy.  Skin:    General: Skin is warm and dry.     Capillary Refill: Capillary refill takes less than 2 seconds.  Neurological:     General: No focal deficit present.     Mental Status: She is alert and oriented to person, place, and time.  Psychiatric:        Attention and Perception: Attention normal.        Mood and Affect: Mood normal.        Speech: Speech normal.        Behavior: Behavior normal. Behavior is cooperative.        Thought Content: Thought content normal.        Cognition and Memory: Cognition normal.        Judgment: Judgment normal.      Assessment & Plan:  Established care today Tachycardia and elevated blood pressure off of medications: bystolic 5 mg administered in clinc bringing HR/BP down. Refill medications PTSD anxiety/depression: psychiatry referral, initiated SSRI and Xanax follow up in 4 weeks if not completed apt with psychiatry.  Labs  today.  Endocrinology Referral r/t DM diagnosis 2 years ago with A1C reported >7 non controlled. Evaluate DM 1 or 2 with  29 year old pt.  Continue weight loss lower calorie increase exercise. Time spent discussing the above educating, and print outs provided. Problem List Items Addressed This Visit      Cardiovascular and Mediastinum   Hypertension   Relevant Medications   losartan-hydrochlorothiazide (HYZAAR) 50-12.5 MG tablet   atorvastatin (LIPITOR) 20 MG tablet   nebivolol (BYSTOLIC) tablet 5 mg     Endocrine   Type 2 diabetes mellitus with other specified complication (HCC)   Relevant Medications   metFORMIN (GLUCOPHAGE) 500 MG tablet   losartan-hydrochlorothiazide (HYZAAR) 50-12.5 MG tablet   LANTUS SOLOSTAR 100 UNIT/ML Solostar Pen   atorvastatin (LIPITOR) 20 MG tablet   Other Relevant Orders   Microalbumin, urine   Ambulatory referral to Endocrinology     Other   Adult BMI 31.0-31.9 kg/sq m - Primary   Relevant Orders   Hemoglobin A1c   Lipid panel   CBC with Differential/Platelet   COMPLETE METABOLIC PANEL WITH GFR   T4, free   TSH   PTSD (post-traumatic stress disorder)   Relevant Medications   PARoxetine (PAXIL) 10 MG tablet   ALPRAZolam (XANAX) 0.25 MG tablet   Other Relevant Orders   Ambulatory referral to Psychiatry   Anxiety and depression   Relevant Medications   PARoxetine (PAXIL) 10 MG tablet   ALPRAZolam (XANAX) 0.25 MG tablet   Other Relevant Orders   Ambulatory referral to Psychiatry    Other Visit Diagnoses    Screening for metabolic disorder       Relevant Orders   Hemoglobin A1c   Lipid panel   CBC with Differential/Platelet   COMPLETE METABOLIC PANEL WITH GFR   Tachycardia       Relevant Medications   nebivolol (BYSTOLIC) tablet 5 mg      Outpatient Encounter Medications as of 03/24/2020  Medication Sig  . atorvastatin (LIPITOR) 20 MG tablet Take 1 tablet (20 mg total) by mouth daily.  Marland Kitchen LANTUS SOLOSTAR 100 UNIT/ML Solostar Pen  Inject 40 Units into the skin at bedtime.  Marland Kitchen losartan-hydrochlorothiazide (HYZAAR) 50-12.5 MG tablet Take 1 tablet by mouth daily.  . metFORMIN (GLUCOPHAGE) 500 MG tablet Take 1 tablet (500 mg total) by mouth 2 (two) times daily with a meal.  . [DISCONTINUED] atorvastatin (LIPITOR) 20 MG tablet Take 20 mg by mouth daily.   . [DISCONTINUED] cyclobenzaprine (FLEXERIL) 10 MG tablet Take 1 tablet (10 mg total) by mouth 3 (three) times daily as needed for muscle spasms.  . [DISCONTINUED] hydrOXYzine (ATARAX/VISTARIL) 25 MG tablet Take 1 tablet (25 mg total) by mouth every 6 (six) hours as needed for anxiety.  . [DISCONTINUED] LANTUS SOLOSTAR 100 UNIT/ML Solostar Pen Inject 40 Units into the skin at bedtime.   . [DISCONTINUED] losartan-hydrochlorothiazide (HYZAAR) 50-12.5 MG tablet Take 1 tablet by mouth daily.   . [DISCONTINUED] metFORMIN (GLUCOPHAGE) 500 MG tablet Take 1 tablet (500 mg total) by mouth 2 (two) times daily with a meal for 30 days.  . ALPRAZolam (XANAX) 0.25 MG tablet Take 1 tablet (0.25 mg total) by mouth 2 (two) times daily as needed for anxiety.  Marland Kitchen PARoxetine (PAXIL) 10 MG tablet Take 1 tablet (10 mg total) by mouth daily.   Facility-Administered Encounter Medications as of 03/24/2020  Medication  . nebivolol (BYSTOLIC) tablet 5 mg    Follow-up: Return in about 4 weeks (around 04/21/2020) for f/u initiated treatment PTSD depression/anxiety.   Annie Main, FNP

## 2020-03-25 LAB — CBC WITH DIFFERENTIAL/PLATELET
Absolute Monocytes: 543 cells/uL (ref 200–950)
Basophils Absolute: 122 cells/uL (ref 0–200)
Basophils Relative: 1.5 %
Eosinophils Absolute: 786 cells/uL — ABNORMAL HIGH (ref 15–500)
Eosinophils Relative: 9.7 %
HCT: 46.7 % — ABNORMAL HIGH (ref 35.0–45.0)
Hemoglobin: 15.5 g/dL (ref 11.7–15.5)
Lymphs Abs: 2228 cells/uL (ref 850–3900)
MCH: 30.4 pg (ref 27.0–33.0)
MCHC: 33.2 g/dL (ref 32.0–36.0)
MCV: 91.6 fL (ref 80.0–100.0)
MPV: 9.8 fL (ref 7.5–12.5)
Monocytes Relative: 6.7 %
Neutro Abs: 4423 cells/uL (ref 1500–7800)
Neutrophils Relative %: 54.6 %
Platelets: 436 10*3/uL — ABNORMAL HIGH (ref 140–400)
RBC: 5.1 10*6/uL (ref 3.80–5.10)
RDW: 12 % (ref 11.0–15.0)
Total Lymphocyte: 27.5 %
WBC: 8.1 10*3/uL (ref 3.8–10.8)

## 2020-03-25 LAB — MICROALBUMIN, URINE: Microalb, Ur: 0.3 mg/dL

## 2020-03-25 LAB — COMPLETE METABOLIC PANEL WITH GFR
AG Ratio: 1.3 (calc) (ref 1.0–2.5)
ALT: 13 U/L (ref 6–29)
AST: 14 U/L (ref 10–30)
Albumin: 4.1 g/dL (ref 3.6–5.1)
Alkaline phosphatase (APISO): 120 U/L (ref 31–125)
BUN: 16 mg/dL (ref 7–25)
CO2: 20 mmol/L (ref 20–32)
Calcium: 9.6 mg/dL (ref 8.6–10.2)
Chloride: 93 mmol/L — ABNORMAL LOW (ref 98–110)
Creat: 0.78 mg/dL (ref 0.50–1.10)
GFR, Est African American: 119 mL/min/{1.73_m2} (ref >=60)
GFR, Est Non African American: 103 mL/min/{1.73_m2} (ref >=60)
Globulin: 3.2 g/dL (calc) (ref 1.9–3.7)
Glucose, Bld: 438 mg/dL — ABNORMAL HIGH (ref 65–99)
Potassium: 4.7 mmol/L (ref 3.5–5.3)
Sodium: 127 mmol/L — ABNORMAL LOW (ref 135–146)
Total Bilirubin: 0.7 mg/dL (ref 0.2–1.2)
Total Protein: 7.3 g/dL (ref 6.1–8.1)

## 2020-03-25 LAB — TSH: TSH: 2.18 mIU/L

## 2020-03-25 LAB — LIPID PANEL
Cholesterol: 224 mg/dL — ABNORMAL HIGH (ref <200)
HDL: 33 mg/dL — ABNORMAL LOW (ref >=50)
Non-HDL Cholesterol (Calc): 191 mg/dL (calc) — ABNORMAL HIGH (ref <130)
Total CHOL/HDL Ratio: 6.8 (calc) — ABNORMAL HIGH (ref <5.0)
Triglycerides: 573 mg/dL — ABNORMAL HIGH (ref <150)

## 2020-03-25 LAB — HEMOGLOBIN A1C: Hgb A1c MFr Bld: >14 % of total Hgb — ABNORMAL HIGH (ref <5.7)

## 2020-03-25 LAB — T4, FREE: Free T4: 1.2 ng/dL (ref 0.8–1.8)

## 2020-03-26 ENCOUNTER — Encounter (HOSPITAL_COMMUNITY): Payer: Self-pay | Admitting: Emergency Medicine

## 2020-03-26 ENCOUNTER — Other Ambulatory Visit: Payer: Self-pay

## 2020-03-26 ENCOUNTER — Emergency Department (HOSPITAL_COMMUNITY)
Admission: EM | Admit: 2020-03-26 | Discharge: 2020-03-26 | Disposition: A | Discharge status: Home / Self Care | Payer: Medicaid Other | Attending: Emergency Medicine | Admitting: Emergency Medicine

## 2020-03-26 ENCOUNTER — Emergency Department (HOSPITAL_COMMUNITY): Payer: Medicaid Other

## 2020-03-26 DIAGNOSIS — R739 Hyperglycemia, unspecified: Secondary | ICD-10-CM

## 2020-03-26 DIAGNOSIS — J45909 Unspecified asthma, uncomplicated: Secondary | ICD-10-CM | POA: Diagnosis not present

## 2020-03-26 DIAGNOSIS — R0602 Shortness of breath: Secondary | ICD-10-CM | POA: Diagnosis not present

## 2020-03-26 DIAGNOSIS — E1165 Type 2 diabetes mellitus with hyperglycemia: Secondary | ICD-10-CM | POA: Insufficient documentation

## 2020-03-26 DIAGNOSIS — F17210 Nicotine dependence, cigarettes, uncomplicated: Secondary | ICD-10-CM | POA: Diagnosis not present

## 2020-03-26 DIAGNOSIS — Z794 Long term (current) use of insulin: Secondary | ICD-10-CM | POA: Insufficient documentation

## 2020-03-26 DIAGNOSIS — Z79899 Other long term (current) drug therapy: Secondary | ICD-10-CM | POA: Insufficient documentation

## 2020-03-26 DIAGNOSIS — R55 Syncope and collapse: Secondary | ICD-10-CM | POA: Diagnosis present

## 2020-03-26 DIAGNOSIS — I10 Essential (primary) hypertension: Secondary | ICD-10-CM | POA: Diagnosis not present

## 2020-03-26 LAB — CBC WITH DIFFERENTIAL/PLATELET
Abs Immature Granulocytes: 0.05 10*3/uL (ref 0.00–0.07)
Basophils Absolute: 0.1 10*3/uL (ref 0.0–0.1)
Basophils Relative: 2 %
Eosinophils Absolute: 0.8 10*3/uL — ABNORMAL HIGH (ref 0.0–0.5)
Eosinophils Relative: 10 %
HCT: 42 % (ref 36.0–46.0)
Hemoglobin: 14.3 g/dL (ref 12.0–15.0)
Immature Granulocytes: 1 %
Lymphocytes Relative: 35 %
Lymphs Abs: 2.9 10*3/uL (ref 0.7–4.0)
MCH: 30.2 pg (ref 26.0–34.0)
MCHC: 34 g/dL (ref 30.0–36.0)
MCV: 88.6 fL (ref 80.0–100.0)
Monocytes Absolute: 0.5 10*3/uL (ref 0.1–1.0)
Monocytes Relative: 6 %
Neutro Abs: 3.7 10*3/uL (ref 1.7–7.7)
Neutrophils Relative %: 46 %
Platelets: 375 10*3/uL (ref 150–400)
RBC: 4.74 MIL/uL (ref 3.87–5.11)
RDW: 12.4 % (ref 11.5–15.5)
WBC: 8.1 10*3/uL (ref 4.0–10.5)
nRBC: 0 % (ref 0.0–0.2)

## 2020-03-26 LAB — CBG MONITORING, ED
Glucose-Capillary: 423 mg/dL — ABNORMAL HIGH (ref 70–99)
Glucose-Capillary: 308 mg/dL — ABNORMAL HIGH (ref 70–99)

## 2020-03-26 LAB — BASIC METABOLIC PANEL
Anion gap: 9 (ref 5–15)
BUN: 9 mg/dL (ref 6–20)
CO2: 24 mmol/L (ref 22–32)
Calcium: 8.9 mg/dL (ref 8.9–10.3)
Chloride: 100 mmol/L (ref 98–111)
Creatinine, Ser: 0.67 mg/dL (ref 0.44–1.00)
GFR calc Af Amer: >60 mL/min (ref >60)
GFR calc non Af Amer: >60 mL/min (ref >60)
Glucose, Bld: 336 mg/dL — ABNORMAL HIGH (ref 70–99)
Potassium: 3.4 mmol/L — ABNORMAL LOW (ref 3.5–5.1)
Sodium: 133 mmol/L — ABNORMAL LOW (ref 135–145)

## 2020-03-26 LAB — POC URINE PREG, ED: Preg Test, Ur: NEGATIVE

## 2020-03-26 LAB — D-DIMER, QUANTITATIVE: D-Dimer, Quant: 0.65 ug/mL-FEU — ABNORMAL HIGH (ref 0.00–0.50)

## 2020-03-26 MED ORDER — IOHEXOL 350 MG/ML SOLN
100.0000 mL | Freq: Once | INTRAVENOUS | Status: AC | PRN
  Administered 2020-03-26: 100 mL via INTRAVENOUS

## 2020-03-26 MED ORDER — SODIUM CHLORIDE 0.9 % IV SOLN: INTRAVENOUS | Status: DC

## 2020-03-26 MED ORDER — SODIUM CHLORIDE 0.9 % IV BOLUS
1000.0000 mL | Freq: Once | INTRAVENOUS | Status: AC
  Administered 2020-03-26: 1000 mL via INTRAVENOUS

## 2020-03-26 MED ORDER — INSULIN ASPART 100 UNIT/ML ~~LOC~~ SOLN
8.0000 [IU] | Freq: Once | SUBCUTANEOUS | Status: AC
  Administered 2020-03-26: 8 [IU] via SUBCUTANEOUS
  Filled 2020-03-26: qty 1

## 2020-03-26 NOTE — ED Triage Notes (Signed)
2 weeks ago, pt found her clients son dead on the front porch.  Pt has PTSD following incidence and having trouble sleeping.  Have not slept in 2 days.  Pt says glucose has being running in 400's but is currently seeing Harvel Ricks, PA in [place] to regulate glucose (last seen on Thursday, with lab done).  C/o anxiety since finding pt's son dead.   Given 350 ml of NS by EMS.  # 20 g jelco to right ac, patent and brisk blood return.

## 2020-03-26 NOTE — Discharge Instructions (Addendum)
Increase your Lantus to 50 units at bedtime.  Follow-up with your primary care doctor to discuss further adjustment of your diabetes locations

## 2020-03-26 NOTE — ED Notes (Signed)
Called [phone] Skeet Simmer) boy-friend ok to talk to him per pt.

## 2020-03-26 NOTE — ED Provider Notes (Signed)
[facility] EMERGENCY DEPARTMENT Provider Note   CSN: [id number] Arrival date & time: 03/26/20  1433     History Chief Complaint  Patient presents with  . Diabetes    Danielle Zimmerman is a 29 y.o. female.  HPI   Pt was at home when she started to feel dizzy and week.  She had a syncopal episode.  Pt was vacuuming and then felt dizzy and weak.   She started to sit down and began shaking.  Somehow pt managed to get to her room.  She does not recall how. She was still out of it so family called EMS.  Pt does remember the paramedics arriving.  SHe remembers them talking but she could not talk to them right away but did open her eyes when they rubbed on her chest.     No fevers or chills. SHe has had some GERD and acid reflux a couple of nights ago, intermittent chest pain.  Her sugar has been running high.  SShe just started back on her medications Friday and Saturday.  She had been out for a while.   She has been having a lot of anxiety with a recent stress at work.  She found a person dead.    Past Medical History:  Diagnosis Date  . Asthma   . Chronic back pain   . Diabetes mellitus without complication (HCC) 05/2019  . GERD (gastroesophageal reflux disease)   . Hypertension   . Kidney stone   . Nausea and vomiting    recurrent  . Recurrent abdominal pain   . Seasonal allergies     Patient Active Problem List   Diagnosis Date Noted  . Hypertension 03/24/2020  . Type 2 diabetes mellitus with other specified complication (HCC) 03/24/2020  . Adult BMI 31.0-31.9 kg/sq m 03/24/2020  . PTSD (post-traumatic stress disorder) 03/24/2020  . Anxiety and depression 03/24/2020    Past Surgical History:  Procedure Laterality Date  . CESAREAN SECTION    . TONSILLECTOMY AND ADENOIDECTOMY       OB History    Gravida  1   Para      Term      Preterm      AB      Living        SAB      TAB      Ectopic      Multiple      Live Births              History  reviewed. No pertinent family history.  Social History   Tobacco Use  . Smoking status: Current Every Day Smoker    Packs/day: 0.50    Years: 10.00    Pack years: 5.00    Types: Cigarettes  . Smokeless tobacco: Never Used  . Tobacco comment: 2-3 cigarettes daily  Substance Use Topics  . Alcohol use: No  . Drug use: Not Currently    Types: Marijuana    Home Medications Prior to Admission medications   Medication Sig Start Date End Date Taking? Authorizing Provider  ALPRAZolam (XANAX) 0.25 MG tablet Take 1 tablet (0.25 mg total) by mouth 2 (two) times daily as needed for anxiety. 03/24/20  Yes Bates, Crystal A, FNP  atorvastatin (LIPITOR) 20 MG tablet Take 1 tablet (20 mg total) by mouth daily. 03/24/20  Yes Bates, Crystal A, FNP  ibuprofen (ADVIL) 200 MG tablet Take 400 mg by mouth daily as needed for headache, mild pain or moderate  pain.   Yes [provider]  LANTUS SOLOSTAR 100 UNIT/ML Solostar Pen Inject 40 Units into the skin at bedtime. 03/24/20  Yes Bates, Crystal A, FNP  losartan-hydrochlorothiazide (HYZAAR) 50-12.5 MG tablet Take 1 tablet by mouth daily. 03/24/20  Yes Redmond Baseman, Crystal A, FNP  metFORMIN (GLUCOPHAGE) 500 MG tablet Take 1 tablet (500 mg total) by mouth 2 (two) times daily with a meal. 03/24/20 10/20/20 Yes Bates, Crystal A, FNP  PARoxetine (PAXIL) 10 MG tablet Take 1 tablet (10 mg total) by mouth daily. 03/24/20  Yes Redmond Baseman, Crystal A, FNP  Lancets (ACCU-CHEK SOFT TOUCH) lancets 1 each by Other route as needed. Use as instructed Patient not taking: Reported on 03/26/2020 03/24/20   Annie Main, FNP    Allergies    Hydrocodone  Review of Systems   Review of Systems  All other systems reviewed and are negative.   Physical Exam Updated Vital Signs BP 100/62   Pulse 89   Temp (!) 97.4 F (36.3 C) (Oral)   Resp 18   Ht 1.727 m (5\' 8" )   Wt 95.3 kg   LMP 03/04/2020 (Approximate) Comment: neg preg  SpO2 100%   BMI 31.93 kg/m   Physical  Exam Vitals and nursing note reviewed.  Constitutional:      General: She is not in acute distress.    Appearance: She is well-developed.  HENT:     Head: Normocephalic and atraumatic.     Right Ear: External ear normal.     Left Ear: External ear normal.  Eyes:     General: No scleral icterus.       Right eye: No discharge.        Left eye: No discharge.     Conjunctiva/sclera: Conjunctivae normal.  Neck:     Trachea: No tracheal deviation.  Cardiovascular:     Rate and Rhythm: Normal rate and regular rhythm.  Pulmonary:     Effort: Pulmonary effort is normal. No respiratory distress.     Breath sounds: Normal breath sounds. No stridor. No wheezing or rales.  Abdominal:     General: Bowel sounds are normal. There is no distension.     Palpations: Abdomen is soft.     Tenderness: There is no abdominal tenderness. There is no guarding or rebound.  Musculoskeletal:        General: No tenderness.     Cervical back: Neck supple.  Skin:    General: Skin is warm and dry.     Findings: No rash.  Neurological:     Mental Status: She is alert.     Cranial Nerves: No cranial nerve deficit (no facial droop, extraocular movements intact, no slurred speech).     Sensory: No sensory deficit.     Motor: No abnormal muscle tone or seizure activity.     Coordination: Coordination normal.     ED Results / Procedures / Treatments   Labs (all labs ordered are listed, but only abnormal results are displayed) Labs Reviewed  BASIC METABOLIC PANEL - Abnormal; Notable for the following components:      Result Value   Sodium 133 (*)    Potassium 3.4 (*)    Glucose, Bld 336 (*)    All other components within normal limits  CBC WITH DIFFERENTIAL/PLATELET - Abnormal; Notable for the following components:   Eosinophils Absolute 0.8 (*)    All other components within normal limits  D-DIMER, QUANTITATIVE (NOT AT [facility]) - Abnormal; Notable for the following components:  D-Dimer, Quant 0.65 (*)     All other components within normal limits  CBG MONITORING, ED - Abnormal; Notable for the following components:   Glucose-Capillary 423 (*)    All other components within normal limits  CBG MONITORING, ED - Abnormal; Notable for the following components:   Glucose-Capillary 308 (*)    All other components within normal limits  POC URINE PREG, ED    EKG EKG Interpretation  Date/Time:  Saturday March 26 2020 14:48:29 EDT Ventricular Rate:  100 PR Interval:    QRS Duration: 87 QT Interval:  351 QTC Calculation: 453 R Axis:   104 Text Interpretation: Sinus tachycardia Borderline right axis deviation Nonspecific T abnormalities, inferior leads ST elevation, consider lateral injury Since last tracing rate faster Confirmed by Linwood Dibbles [phone]) on 03/26/2020 4:14:49 PM   Radiology DG Chest 2 View  Result Date: 03/26/2020 CLINICAL DATA:  Syncope EXAM: CHEST - 2 VIEW COMPARISON:  February 19, 2020 FINDINGS: The heart size and mediastinal contours are within normal limits. Both lungs are clear. The visualized skeletal structures are unremarkable. IMPRESSION: No active cardiopulmonary disease. Electronically Signed   By: Gerome Sam III M.D   On: 03/26/2020 17:09   CT Angio Chest PE W and/or Wo Contrast  Result Date: 03/26/2020 CLINICAL DATA:  Shortness of breath x2 weeks. EXAM: CT ANGIOGRAPHY CHEST WITH CONTRAST TECHNIQUE: Multidetector CT imaging of the chest was performed using the standard protocol during bolus administration of intravenous contrast. Multiplanar CT image reconstructions and MIPs were obtained to evaluate the vascular anatomy. CONTRAST:  OMNIPAQUE IOHEXOL 350 MG/ML SOLN COMPARISON:  November 06, 2019 FINDINGS: Cardiovascular: Satisfactory opacification of the pulmonary arteries to the segmental level. No evidence of pulmonary embolism. Normal heart size. No pericardial effusion. Mediastinum/Nodes: No enlarged mediastinal, hilar, or axillary lymph nodes. Thyroid gland,  trachea, and esophagus demonstrate no significant findings. Lungs/Pleura: Lungs are clear. No pleural effusion or pneumothorax. Upper Abdomen: No acute abnormality. Musculoskeletal: No chest wall abnormality. No acute or significant osseous findings. Review of the MIP images confirms the above findings. IMPRESSION: Negative examination for pulmonary embolism or active cardiopulmonary disease. Electronically Signed   By: Aram Candela M.D.   On: 03/26/2020 19:22    Procedures Procedures (including critical care time)  Medications Ordered in ED Medications  sodium chloride 0.9 % bolus 1,000 mL (0 mLs Intravenous Stopped 03/26/20 1859)    And  0.9 %  sodium chloride infusion (has no administration in time range)  insulin aspart (novoLOG) injection 8 Units (8 Units Subcutaneous Given 03/26/20 1718)  iohexol (OMNIPAQUE) 350 MG/ML injection 100 mL (100 mLs Intravenous Contrast Given 03/26/20 1901)    ED Course  I have reviewed the triage vital signs and the nursing notes.  Pertinent labs & imaging results that were available during my care of the patient were reviewed by me and considered in my medical decision making (see chart for details).  Clinical Course as of Mar 26 2004  Sat Mar 26, 2020  1715 Chest x-ray negative   [JK]  1715 Hyperglycemia noted.  D-dimer elevated.  With her tachycardia and complaints of chest pain we will proceed with CT scan   [JK]    Clinical Course User Index [JK] Linwood Dibbles, MD   MDM Rules/Calculators/A&P                      Patient presented to the ED for evaluation of hyperglycemia.  Patient also mentions some episodes of chest pain.  Her labs were notable for hyperglycemia as well as an elevated D-dimer.  CT angiogram was performed and there is no evidence of pulmonary embolism.  Symptoms are not suggestive of acute coronary syndrome.  Patient's hyperglycemia improved with IV fluids and a dose of insulin.  I will have the patient increase her daily  insulin dose.  Patient states she recently changed doctors.  She had been on 60 units but her doctor decreased her down to 40.  I will have her go back up to 50 units and follow-up with her doctor closely. Final Clinical Impression(s) / ED Diagnoses Final diagnoses:  Hyperglycemia    Rx / DC Orders ED Discharge Orders    None       Linwood Dibbles, MD 03/26/20 2006

## 2020-03-28 ENCOUNTER — Other Ambulatory Visit: Payer: Self-pay | Admitting: Nurse Practitioner

## 2020-04-18 ENCOUNTER — Other Ambulatory Visit: Payer: Self-pay | Admitting: Nurse Practitioner

## 2020-04-18 DIAGNOSIS — F4310 Post-traumatic stress disorder, unspecified: Secondary | ICD-10-CM

## 2020-04-18 DIAGNOSIS — F419 Anxiety disorder, unspecified: Secondary | ICD-10-CM

## 2020-04-21 ENCOUNTER — Ambulatory Visit: Payer: Medicaid Other | Admitting: Nurse Practitioner

## 2020-05-10 ENCOUNTER — Other Ambulatory Visit: Payer: Self-pay

## 2020-05-10 ENCOUNTER — Ambulatory Visit (INDEPENDENT_AMBULATORY_CARE_PROVIDER_SITE_OTHER): Payer: Medicaid Other | Admitting: Nurse Practitioner

## 2020-05-10 VITALS — BP 114/66 | HR 112 | Temp 97.6°F | Resp 18 | Wt 238.4 lb

## 2020-05-10 DIAGNOSIS — F419 Anxiety disorder, unspecified: Secondary | ICD-10-CM | POA: Diagnosis not present

## 2020-05-10 DIAGNOSIS — R635 Abnormal weight gain: Secondary | ICD-10-CM

## 2020-05-10 DIAGNOSIS — F329 Major depressive disorder, single episode, unspecified: Secondary | ICD-10-CM | POA: Diagnosis not present

## 2020-05-10 MED ORDER — PAROXETINE HCL 20 MG PO TABS: 20.0000 mg | ORAL_TABLET | Freq: Every day | ORAL | 1 refills | Status: DC

## 2020-05-10 MED ORDER — INSULIN PEN NEEDLE 32G X 4 MM MISC
5 refills | Status: AC
Start: 2020-05-10 — End: ?

## 2020-05-10 NOTE — Patient Instructions (Signed)
You have gained just over 28 pounds in about 6 weeks We will check thyroid labs today Follow a low carbohydrate, low sugar, low-fat, low-cholesterol, low calorie diet inrease exercise, you should get at least 20 minutes of exercise at least 4 times per week. Make an appointment with psychiatrist, I am increasing your Prozac you should follow-up with me or psychiatry in 4 weeks preferably psychiatry I want you to have counseling in addition to medication. Per your request I am refilling needles for your insulin pens today The clinic referral manager/LPN/Shannon will be contacting psychiatry to assure appointment expedited.

## 2020-05-10 NOTE — Progress Notes (Signed)
Established Patient Office Visit  Subjective:  Patient ID: Danielle Zimmerman, female    DOB: [birth year]  Age: 29 y.o. MRN: [id number]  CC:  Chief Complaint  Patient presents with   Follow-up    DM and anxiety    HPI Danielle Zimmerman is a 29 year old female presenting to the clinic for a 4-week follow-up for depression and anxiety after initiating Prozac and a 2-week as needed supply of Xanax.  The patient was referred to psychiatry.  Today the patient reports that she has not been contacted for an appointment with psychiatry.  The patient has gained over 28 pounds in the past 6 weeks.  The patient reports that she is feeling less anxiety and PTSD.  She reports before starting the treatment she was unable to go up out of her home without handling anxiety and now she has been able to go up out of her home.  She does have some thoughts of her past PTSD experience and would like to increase the dosage and follow-up in 4 weeks either with psychiatry or in this clinic.  Discussed diet and exercise and lab work today to check thyroid screening.  Past Medical History:  Diagnosis Date   Asthma    Chronic back pain    Diabetes mellitus without complication (HCC) 05/2019   GERD (gastroesophageal reflux disease)    Hypertension    Kidney stone    Nausea and vomiting    recurrent   Recurrent abdominal pain    Seasonal allergies     Past Surgical History:  Procedure Laterality Date   CESAREAN SECTION     TONSILLECTOMY AND ADENOIDECTOMY      No family history on file.  Social History   Socioeconomic History   Marital status: Single    Spouse name: Not on file   Number of children: Not on file   Years of education: Not on file   Highest education level: Not on file  Occupational History   Not on file  Tobacco Use   Smoking status: Current Every Day Smoker    Packs/day: 0.50    Years: 10.00    Pack years: 5.00    Types: Cigarettes   Smokeless tobacco: Never  Used   Tobacco comment: 2-3 cigarettes daily  Substance and Sexual Activity   Alcohol use: No   Drug use: Not Currently    Types: Marijuana   Sexual activity: Yes  Other Topics Concern   Not on file  Social History Narrative   Not on file   Social Determinants of Health   Financial Resource Strain:    Difficulty of Paying Living Expenses:   Food Insecurity:    Worried About Programme researcher, broadcasting/film/video in the Last Year:    Barista in the Last Year:   Transportation Needs:    Freight forwarder (Medical):    Lack of Transportation (Non-Medical):   Physical Activity:    Days of Exercise per Week:    Minutes of Exercise per Session:   Stress:    Feeling of Stress :   Social Connections:    Frequency of Communication with Friends and Family:    Frequency of Social Gatherings with Friends and Family:    Attends Religious Services:    Active Member of Clubs or Organizations:    Attends Banker Meetings:    Marital Status:   Intimate Partner Violence:    Fear of Current or Ex-Partner:    Emotionally  Abused:    Physically Abused:    Sexually Abused:     Outpatient Medications Prior to Visit  Medication Sig Dispense Refill   atorvastatin (LIPITOR) 20 MG tablet Take 1 tablet (20 mg total) by mouth daily. 30 tablet 6   ibuprofen (ADVIL) 200 MG tablet Take 400 mg by mouth daily as needed for headache, mild pain or moderate pain.     Lancets (ACCU-CHEK SOFT TOUCH) lancets 1 each by Other route as needed. Use as instructed 100 each 2   LANTUS SOLOSTAR 100 UNIT/ML Solostar Pen Inject 40 Units into the skin at bedtime. 15 mL 6   losartan-hydrochlorothiazide (HYZAAR) 50-12.5 MG tablet Take 1 tablet by mouth daily. 30 tablet 6   metFORMIN (GLUCOPHAGE) 500 MG tablet Take 1 tablet (500 mg total) by mouth 2 (two) times daily with a meal. 60 tablet 6   ALPRAZolam (XANAX) 0.25 MG tablet Take 1 tablet (0.25 mg total) by mouth 2 (two) times daily  as needed for anxiety. 20 tablet 0   PARoxetine (PAXIL) 10 MG tablet TAKE 1 TABLET(10 MG) BY MOUTH DAILY 30 tablet 2   Facility-Administered Medications Prior to Visit  Medication Dose Route Frequency Provider Last Rate Last Admin   nebivolol (BYSTOLIC) tablet 5 mg  5 mg Oral Daily Redmond Baseman, Dian Minahan A, FNP   5 mg at 03/24/20 1202    Allergies  Allergen Reactions   Hydrocodone Nausea And Vomiting    ROS Review of Systems  All other systems reviewed and are negative.     Objective:    Physical Exam  Constitutional: She is oriented to person, place, and time. She appears well-developed and well-nourished.  HENT:  Head: Normocephalic.  Eyes: Pupils are equal, round, and reactive to light. Conjunctivae and EOM are normal.  Neck: No JVD present. No thyromegaly present.  Cardiovascular: Normal rate.  Pulmonary/Chest: Effort normal.  Musculoskeletal:        General: No edema. Normal range of motion.     Cervical back: Normal range of motion and neck supple.  Lymphadenopathy:    She has no cervical adenopathy.  Neurological: She is alert and oriented to person, place, and time.  Skin: Skin is warm and dry. No rash noted. No erythema.  Psychiatric: She has a normal mood and affect. Her behavior is normal. Judgment and thought content normal.  Nursing note and vitals reviewed.   BP 114/66 (BP Location: Left Arm, Patient Position: Sitting, Cuff Size: Large)    Pulse (!) 112    Temp 97.6 F (36.4 C) (Temporal)    Resp 18    Wt 238 lb 6.4 oz (108.1 kg)    SpO2 97%    BMI 36.25 kg/m  Wt Readings from Last 3 Encounters:  05/10/20 238 lb 6.4 oz (108.1 kg)  03/26/20 210 lb (95.3 kg)  03/24/20 210 lb 6.4 oz (95.4 kg)     Health Maintenance Due  Topic Date Due   Hepatitis C Screening  Never done   PNEUMOCOCCAL POLYSACCHARIDE VACCINE AGE 30-64 HIGH RISK  Never done   FOOT EXAM  Never done   OPHTHALMOLOGY EXAM  Never done   COVID-19 Vaccine (1) Never done   HIV Screening   Never done   PAP SMEAR-Modifier  Never done    There are no preventive care reminders to display for this patient.  Lab Results  Component Value Date   TSH 2.18 03/24/2020   Lab Results  Component Value Date   WBC 8.1 03/26/2020  HGB 14.3 03/26/2020   HCT 42.0 03/26/2020   MCV 88.6 03/26/2020   PLT 375 03/26/2020   Lab Results  Component Value Date   NA 133 (L) 03/26/2020   K 3.4 (L) 03/26/2020   CO2 24 03/26/2020   GLUCOSE 336 (H) 03/26/2020   BUN 9 03/26/2020   CREATININE 0.67 03/26/2020   BILITOT 0.7 03/24/2020   ALKPHOS 97 01/01/2020   AST 14 03/24/2020   ALT 13 03/24/2020   PROT 7.3 03/24/2020   ALBUMIN 4.0 01/01/2020   CALCIUM 8.9 03/26/2020   ANIONGAP 9 03/26/2020   Lab Results  Component Value Date   CHOL 224 (H) 03/24/2020   Lab Results  Component Value Date   HDL 33 (L) 03/24/2020   Lab Results  Component Value Date   [facility]  03/24/2020     Comment:     . LDL cholesterol not calculated. Triglyceride levels greater than 400 mg/dL invalidate calculated LDL results. . Reference range: <100 . Desirable range <100 mg/dL for primary prevention;   <70 mg/dL for patients with CHD or diabetic patients  with > or = 2 CHD risk factors. Marland Kitchen LDL-C is now calculated using the Martin-Hopkins  calculation, which is a validated novel method providing  better accuracy than the Friedewald equation in the  estimation of LDL-C.  Horald Pollen et al. Lenox Ahr. 2458;099(83): 2061-2068  (http://education.[employer].com/faq/FAQ164)    Lab Results  Component Value Date   TRIG 573 (H) 03/24/2020   Lab Results  Component Value Date   CHOLHDL 6.8 (H) 03/24/2020   Lab Results  Component Value Date   HGBA1C >14.0 (H) 03/24/2020      Assessment & Plan:  You have gained just over 28 pounds in about 6 weeks We will check thyroid labs today Follow a low carbohydrate, low sugar, low-fat, low-cholesterol, low calorie diet inrease exercise, you should get at  least 20 minutes of exercise at least 4 times per week. Make an appointment with psychiatrist, I am increasing your Prozac you should follow-up with me or psychiatry in 4 weeks preferably psychiatry I want you to have counseling in addition to medication. Per your request I am refilling needles for your insulin pens today The clinic referral manager/LPN/Shannon will be contacting psychiatry to assure appointment expedited.  Problem List Items Addressed This Visit      Other   Anxiety and depression   Relevant Medications   PARoxetine (PAXIL) 20 MG tablet    Other Visit Diagnoses    Abnormal weight gain    -  Primary   Relevant Orders   T4, free   TSH      Meds ordered this encounter  Medications   PARoxetine (PAXIL) 20 MG tablet    Sig: Take 1 tablet (20 mg total) by mouth daily.    Dispense:  30 tablet    Refill:  1    Follow-up: Return in about 4 weeks (around 06/07/2020).    Elmore Guise, FNP

## 2020-05-11 ENCOUNTER — Other Ambulatory Visit: Payer: Self-pay | Admitting: Nurse Practitioner

## 2020-05-11 LAB — T4, FREE: Free T4: 0.9 ng/dL (ref 0.8–1.8)

## 2020-05-11 LAB — TSH: TSH: 1.64 mIU/L

## 2020-05-11 NOTE — Progress Notes (Signed)
This pts TSH and T4 is jumping all over and so is her weight. Please contact pt to inquire if she is possibly taking thyroid medication? If she is not refer her to endocrinology.

## 2020-05-30 ENCOUNTER — Telehealth: Payer: Self-pay | Admitting: Nurse Practitioner

## 2020-05-30 NOTE — Telephone Encounter (Signed)
Cb# [phone] Pt was prescribe Paxil having bad reaction

## 2020-05-30 NOTE — Telephone Encounter (Signed)
Pt called stating that the paxil prescribed has caused her to have a reaction as before. Pt states that she is experiencing sob and anxiety. Pt also states wanting to go back on Xanax because she states that she never had a reaction to it. Please advise.

## 2020-05-31 ENCOUNTER — Other Ambulatory Visit: Payer: Self-pay | Admitting: Nurse Practitioner

## 2020-05-31 NOTE — Telephone Encounter (Signed)
This is the note from Referring psychiatry:  Type Date User Summary Attachment  General 04/15/2020 11:48 AM Bobbye Riggs, CMA - -  Note   Sending letter out        . Type Date User Summary Attachment  General 04/07/2020  3:29 PM Bobbye Riggs, CMA - -  Note   Could not leave VM        . Type Date User Summary Attachment  General 03/31/2020 10:31 AM Bobbye Riggs, CMA - -  Note   ATC patient today memory full on phone could not leave a message        My last visit was the beginning of June and stated refill of Xananx once again and increase in paxil and thyroid check as she gained much weight and checked thyroid that day however she was benefiting from the medication. She was doing well. She was encouraged to again make the apointment with psychiatry asap as she needed counseling for PTSD. She need psychiatry. She is not following directions.

## 2020-05-31 NOTE — Telephone Encounter (Signed)
Spoke with pt and she states no one has called her about her psych referral. I explained to pt that you reduced her paxil back down to 10 mg. Verbalizes understanding.

## 2020-05-31 NOTE — Telephone Encounter (Signed)
1. Discuss with office manager about the psyche referral get this pts referred please , this was places in April.  2. Have the pt reduce the Paxil back to 10mg  r/t side effects she was having good results with the 10 mg at her June appt.

## 2020-06-08 ENCOUNTER — Emergency Department (HOSPITAL_COMMUNITY): Payer: Medicaid Other

## 2020-06-08 ENCOUNTER — Ambulatory Visit: Payer: Medicaid Other | Admitting: Nurse Practitioner

## 2020-06-08 ENCOUNTER — Other Ambulatory Visit: Payer: Self-pay

## 2020-06-08 ENCOUNTER — Telehealth: Payer: Self-pay | Admitting: Nurse Practitioner

## 2020-06-08 ENCOUNTER — Encounter (HOSPITAL_COMMUNITY): Payer: Self-pay | Admitting: *Deleted

## 2020-06-08 ENCOUNTER — Emergency Department (HOSPITAL_COMMUNITY)
Admission: EM | Admit: 2020-06-08 | Discharge: 2020-06-09 | Disposition: A | Discharge status: Home / Self Care | Payer: Medicaid Other | Attending: Emergency Medicine | Admitting: Emergency Medicine

## 2020-06-08 DIAGNOSIS — D649 Anemia, unspecified: Secondary | ICD-10-CM | POA: Diagnosis not present

## 2020-06-08 DIAGNOSIS — R1010 Upper abdominal pain, unspecified: Secondary | ICD-10-CM | POA: Diagnosis not present

## 2020-06-08 DIAGNOSIS — I10 Essential (primary) hypertension: Secondary | ICD-10-CM | POA: Diagnosis not present

## 2020-06-08 DIAGNOSIS — E1169 Type 2 diabetes mellitus with other specified complication: Secondary | ICD-10-CM | POA: Diagnosis not present

## 2020-06-08 DIAGNOSIS — Z794 Long term (current) use of insulin: Secondary | ICD-10-CM | POA: Insufficient documentation

## 2020-06-08 DIAGNOSIS — R519 Headache, unspecified: Secondary | ICD-10-CM | POA: Diagnosis not present

## 2020-06-08 DIAGNOSIS — N83202 Unspecified ovarian cyst, left side: Secondary | ICD-10-CM | POA: Diagnosis not present

## 2020-06-08 DIAGNOSIS — J45909 Unspecified asthma, uncomplicated: Secondary | ICD-10-CM | POA: Diagnosis not present

## 2020-06-08 DIAGNOSIS — Z79899 Other long term (current) drug therapy: Secondary | ICD-10-CM | POA: Diagnosis not present

## 2020-06-08 LAB — CBC WITH DIFFERENTIAL/PLATELET
Abs Immature Granulocytes: 0.11 10*3/uL — ABNORMAL HIGH (ref 0.00–0.07)
Basophils Absolute: 0.1 10*3/uL (ref 0.0–0.1)
Basophils Relative: 1 %
Eosinophils Absolute: 1.3 10*3/uL — ABNORMAL HIGH (ref 0.0–0.5)
Eosinophils Relative: 13 %
HCT: 33.9 % — ABNORMAL LOW (ref 36.0–46.0)
Hemoglobin: 11.3 g/dL — ABNORMAL LOW (ref 12.0–15.0)
Immature Granulocytes: 1 %
Lymphocytes Relative: 24 %
Lymphs Abs: 2.4 10*3/uL (ref 0.7–4.0)
MCH: 30.9 pg (ref 26.0–34.0)
MCHC: 33.3 g/dL (ref 30.0–36.0)
MCV: 92.6 fL (ref 80.0–100.0)
Monocytes Absolute: 0.6 10*3/uL (ref 0.1–1.0)
Monocytes Relative: 6 %
Neutro Abs: 5.6 10*3/uL (ref 1.7–7.7)
Neutrophils Relative %: 55 %
Platelets: 523 10*3/uL — ABNORMAL HIGH (ref 150–400)
RBC: 3.66 MIL/uL — ABNORMAL LOW (ref 3.87–5.11)
RDW: 12.9 % (ref 11.5–15.5)
WBC: 10.1 10*3/uL (ref 4.0–10.5)
nRBC: 0 % (ref 0.0–0.2)

## 2020-06-08 LAB — COMPREHENSIVE METABOLIC PANEL
ALT: 25 U/L (ref 0–44)
AST: 25 U/L (ref 15–41)
Albumin: 3.4 g/dL — ABNORMAL LOW (ref 3.5–5.0)
Alkaline Phosphatase: 86 U/L (ref 38–126)
Anion gap: 9 (ref 5–15)
BUN: 11 mg/dL (ref 6–20)
CO2: 24 mmol/L (ref 22–32)
Calcium: 9.4 mg/dL (ref 8.9–10.3)
Chloride: 104 mmol/L (ref 98–111)
Creatinine, Ser: 0.71 mg/dL (ref 0.44–1.00)
GFR calc Af Amer: >60 mL/min (ref >60)
GFR calc non Af Amer: >60 mL/min (ref >60)
Glucose, Bld: 210 mg/dL — ABNORMAL HIGH (ref 70–99)
Potassium: 4 mmol/L (ref 3.5–5.1)
Sodium: 137 mmol/L (ref 135–145)
Total Bilirubin: 0.1 mg/dL — ABNORMAL LOW (ref 0.3–1.2)
Total Protein: 7.4 g/dL (ref 6.5–8.1)

## 2020-06-08 LAB — LIPASE, BLOOD: Lipase: 30 U/L (ref 11–51)

## 2020-06-08 MED ORDER — ONDANSETRON 4 MG PO TBDP
4.0000 mg | ORAL_TABLET | Freq: Once | ORAL | Status: AC
  Administered 2020-06-08: 4 mg via ORAL
  Filled 2020-06-08: qty 1

## 2020-06-08 MED ORDER — IOHEXOL 300 MG/ML  SOLN
100.0000 mL | Freq: Once | INTRAMUSCULAR | Status: AC | PRN
  Administered 2020-06-09: 100 mL via INTRAVENOUS

## 2020-06-08 MED ORDER — MORPHINE SULFATE (PF) 4 MG/ML IV SOLN
4.0000 mg | Freq: Once | INTRAVENOUS | Status: AC
  Administered 2020-06-08: 4 mg via INTRAVENOUS
  Filled 2020-06-08: qty 1

## 2020-06-08 NOTE — Telephone Encounter (Signed)
Pt was no show for appt today. Please check on the psyche referral today that was placed in April. Thanks

## 2020-06-08 NOTE — ED Triage Notes (Signed)
Pt with abd pain with nausea and diarrhea for past 2-3 days.  Also c/o right ear pain, sore throat and jaw pain for 1.5 days. Pt with right upper tooth pain.

## 2020-06-08 NOTE — ED Provider Notes (Addendum)
[facility] EMERGENCY DEPARTMENT Provider Note   CSN: [id number] Arrival date & time: 06/08/20  1841     History Chief Complaint  Patient presents with  . Abdominal Pain    Danielle Zimmerman is a 29 y.o. female with a history of diabetes, GERD, hypertension and history of recurrent abdominal pain presenting with a 1 week history of upper abdominal pain in association with nausea without emesis and nonbloody diarrhea, reporting 2-3 episodes of diarrhea daily, darker than her normal stools.  She states this feels like a "viral stomach bug".  She states any food intake causes increased pain.  She describes episodes of sharp pain which are fleeting, generally the pain is more cramping in character.  She denies dysuria or hematuria, no vaginal discharge, no concerns for STDs.  She is currently due for her menses.  Denies likelihood of pregnancy.  Additionally reports of severe right facial pain which started about 3 days ago, starting in her right ear but now involving her forehead cheek and along her right jawline.  She denies any dental pain, also no fevers or chills.  She feels like her face is swollen.  She has had some nasal discharge, mostly clear, sometimes a little bit thicker and colored.  There is been no drainage from her ear, denies injury, also no dizziness.  The history is provided by the patient.       Past Medical History:  Diagnosis Date  . Asthma   . Chronic back pain   . Diabetes mellitus without complication (HCC) 05/2019  . GERD (gastroesophageal reflux disease)   . Hypertension   . Kidney stone   . Nausea and vomiting    recurrent  . Recurrent abdominal pain   . Seasonal allergies     Patient Active Problem List   Diagnosis Date Noted  . Hypertension 03/24/2020  . Type 2 diabetes mellitus with other specified complication (HCC) 03/24/2020  . Adult BMI 31.0-31.9 kg/sq m 03/24/2020  . PTSD (post-traumatic stress disorder) 03/24/2020  . Anxiety and depression  03/24/2020    Past Surgical History:  Procedure Laterality Date  . CESAREAN SECTION    . TONSILLECTOMY AND ADENOIDECTOMY       OB History    Gravida  1   Para      Term      Preterm      AB      Living        SAB      TAB      Ectopic      Multiple      Live Births              History reviewed. No pertinent family history.  Social History   Tobacco Use  . Smoking status: Current Every Day Smoker    Packs/day: 0.50    Years: 10.00    Pack years: 5.00    Types: Cigarettes  . Smokeless tobacco: Never Used  . Tobacco comment: 2-3 cigarettes daily  Vaping Use  . Vaping Use: Never used  Substance Use Topics  . Alcohol use: No  . Drug use: Not Currently    Types: Marijuana    Home Medications Prior to Admission medications   Medication Sig Start Date End Date Taking? Authorizing Provider  atorvastatin (LIPITOR) 20 MG tablet Take 1 tablet (20 mg total) by mouth daily. 03/24/20  Yes Bates, Crystal A, FNP  ibuprofen (ADVIL) 200 MG tablet Take 800 mg by mouth daily as needed  for headache, mild pain or moderate pain.    Yes [provider]  LANTUS SOLOSTAR 100 UNIT/ML Solostar Pen Inject 40 Units into the skin at bedtime. Patient taking differently: Inject 50 Units into the skin at bedtime.  03/24/20  Yes Bates, Crystal A, FNP  losartan-hydrochlorothiazide (HYZAAR) 50-12.5 MG tablet Take 1 tablet by mouth daily. 03/24/20  Yes Jenne Pane, Crystal A, FNP  metFORMIN (GLUCOPHAGE) 500 MG tablet Take 1 tablet (500 mg total) by mouth 2 (two) times daily with a meal. 03/24/20 10/20/20 Yes Bates, Crystal A, FNP  PARoxetine (PAXIL) 20 MG tablet Take 1 tablet (20 mg total) by mouth daily. 05/10/20  Yes Jenne Pane, Crystal A, FNP  Insulin Pen Needle 32G X 4 MM MISC Use as directed with insulin daily Patient not taking: Reported on 06/08/2020 05/10/20   Elmore Guise, FNP    Allergies    Hydrocodone  Review of Systems   Review of Systems  Constitutional: Negative for  chills and fever.  HENT: Positive for congestion, ear pain and facial swelling. Negative for dental problem, ear discharge, mouth sores and trouble swallowing.   Eyes: Negative.   Respiratory: Negative for chest tightness and shortness of breath.   Cardiovascular: Negative for chest pain.  Gastrointestinal: Positive for abdominal pain, diarrhea and nausea. Negative for vomiting.  Genitourinary: Negative.   Musculoskeletal: Negative for arthralgias, joint swelling and neck pain.  Skin: Negative.  Negative for rash and wound.  Neurological: Negative for dizziness, weakness, light-headedness, numbness and headaches.  Psychiatric/Behavioral: Negative.     Physical Exam Updated Vital Signs BP (!) 135/114 (BP Location: Right Arm)   Pulse (!) 108   Temp 98.5 F (36.9 C) (Oral)   Resp 16   Ht 5\' 7"  (1.702 m)   Wt 108 kg   LMP 05/09/2020   SpO2 95%   BMI 37.28 kg/m   Physical Exam Vitals and nursing note reviewed. Exam conducted with a chaperone present.  Constitutional:      Appearance: She is well-developed.  HENT:     Head: Normocephalic and atraumatic.     Right Ear: Ear canal normal. No mastoid tenderness. Tympanic membrane is not injected or erythematous.     Left Ear: Tympanic membrane and ear canal normal. No mastoid tenderness. Tympanic membrane is not injected or erythematous.     Nose: Rhinorrhea present. Rhinorrhea is clear.     Comments: Clear rhinorrhea.  Right face is tender to palpation including forehead maxilla and along the jawline.  There is no visible edema, although patient endorses she feels her right face is swollen.    Mouth/Throat:     Mouth: Mucous membranes are moist.     Dentition: Normal dentition. No dental tenderness or gingival swelling.     Pharynx: Oropharynx is clear.  Eyes:     Conjunctiva/sclera: Conjunctivae normal.  Cardiovascular:     Rate and Rhythm: Normal rate and regular rhythm.     Heart sounds: Normal heart sounds.  Pulmonary:      Effort: Pulmonary effort is normal.     Breath sounds: Normal breath sounds. No wheezing.  Abdominal:     General: Abdomen is protuberant. Bowel sounds are normal.     Palpations: Abdomen is soft.     Tenderness: There is abdominal tenderness in the epigastric area. There is no guarding.  Genitourinary:    Rectum: Guaiac result negative.  Musculoskeletal:        General: Normal range of motion.     Cervical back:  Normal range of motion.  Lymphadenopathy:     Comments: No head or neck adenopathy  Skin:    General: Skin is warm and dry.  Neurological:     Mental Status: She is alert.     ED Results / Procedures / Treatments   Labs (all labs ordered are listed, but only abnormal results are displayed) Labs Reviewed  CBC WITH DIFFERENTIAL/PLATELET - Abnormal; Notable for the following components:      Result Value   RBC 3.66 (*)    Hemoglobin 11.3 (*)    HCT 33.9 (*)    Platelets 523 (*)    Eosinophils Absolute 1.3 (*)    Abs Immature Granulocytes 0.11 (*)    All other components within normal limits  COMPREHENSIVE METABOLIC PANEL - Abnormal; Notable for the following components:   Glucose, Bld 210 (*)    Albumin 3.4 (*)    Total Bilirubin 0.1 (*)    All other components within normal limits  LIPASE, BLOOD  URINALYSIS, ROUTINE W REFLEX MICROSCOPIC  POC URINE PREG, ED  POC OCCULT BLOOD, ED    EKG None  Radiology No results found.  Procedures Procedures (including critical care time)  Medications Ordered in ED Medications  morphine 4 MG/ML injection 4 mg (4 mg Intravenous Given 06/08/20 2203)  ondansetron (ZOFRAN-ODT) disintegrating tablet 4 mg (4 mg Oral Given 06/08/20 2147)    ED Course  I have reviewed the triage vital signs and the nursing notes.  Pertinent labs & imaging results that were available during my care of the patient were reviewed by me and considered in my medical decision making (see chart for details).    MDM Rules/Calculators/A&P                           Patient with upper abdominal pain nausea, diarrhea and a significant drop in her hemoglobin of 3 g to 11.3 from 14.3 on April 24.  Also with right-sided facial pain without clear indication of source but in the distribution of her trigeminal nerve.  She was given IV morphine and had significant improvement in her pain symptoms.  Some labs are currently pending including her urinalysis and urine pregnancy test.  CT abdomen and pelvis have been ordered and is pending at this time.  Discussed patient with Dr. Manus Gunning who assumes care of patient. Final Clinical Impression(s) / ED Diagnoses Final diagnoses:  Pain of upper abdomen  Anemia, unspecified type  Right sided facial pain    Rx / DC Orders ED Discharge Orders    None       Victoriano Lain 06/08/20 2354    Burgess Amor, PA-C 06/08/20 2355    Glynn Octave, MD 06/09/20 [phone]

## 2020-06-08 NOTE — Telephone Encounter (Signed)
See note. Thanks.

## 2020-06-09 ENCOUNTER — Emergency Department (HOSPITAL_COMMUNITY): Payer: Medicaid Other

## 2020-06-09 LAB — POC URINE PREG, ED: Preg Test, Ur: NEGATIVE

## 2020-06-09 LAB — URINALYSIS, ROUTINE W REFLEX MICROSCOPIC
Bilirubin Urine: NEGATIVE
Glucose, UA: 50 mg/dL — AB
Hgb urine dipstick: NEGATIVE
Ketones, ur: NEGATIVE mg/dL
Leukocytes,Ua: NEGATIVE
Nitrite: NEGATIVE
Protein, ur: NEGATIVE mg/dL
Specific Gravity, Urine: 1.013 (ref 1.005–1.030)
pH: 5 (ref 5.0–8.0)

## 2020-06-09 LAB — WET PREP, GENITAL
Clue Cells Wet Prep HPF POC: NONE SEEN
Sperm: NONE SEEN
Trich, Wet Prep: NONE SEEN
Yeast Wet Prep HPF POC: NONE SEEN

## 2020-06-09 LAB — POC OCCULT BLOOD, ED: Fecal Occult Bld: NEGATIVE

## 2020-06-09 MED ORDER — MORPHINE SULFATE (PF) 4 MG/ML IV SOLN
4.0000 mg | Freq: Once | INTRAVENOUS | Status: AC
  Administered 2020-06-09: 4 mg via INTRAVENOUS
  Filled 2020-06-09: qty 1

## 2020-06-09 MED ORDER — FLUTICASONE PROPIONATE 50 MCG/ACT NA SUSP: 1.0000 | Freq: Every day | NASAL | 0 refills | Status: AC

## 2020-06-09 MED ORDER — AMOXICILLIN 500 MG PO CAPS
500.0000 mg | ORAL_CAPSULE | Freq: Three times a day (TID) | ORAL | 0 refills | Status: DC
Start: 2020-06-09 — End: 2020-10-23

## 2020-06-09 MED ORDER — IBUPROFEN 600 MG PO TABS
600.0000 mg | ORAL_TABLET | Freq: Four times a day (QID) | ORAL | 0 refills | Status: AC | PRN
Start: 2020-06-09 — End: ?

## 2020-06-09 MED ORDER — OMEPRAZOLE 20 MG PO CPDR
20.0000 mg | DELAYED_RELEASE_CAPSULE | Freq: Every day | ORAL | 0 refills | Status: DC
Start: 2020-06-09 — End: 2021-04-17

## 2020-06-09 NOTE — ED Provider Notes (Signed)
CT without acute findings.  Does show a left ovarian cyst.  On recheck, patient does have significant pain in her left lower quadrant with voluntary guarding.  We will proceed with ultrasound tonight as per radiology recommendations.  Pelvic exam has scant white discharge in vaginal vault. Chaperone Biochemist, clinical present.  Midline tenderness with some tenderness to the left adnexa. No CMT  Ultrasound obtained to rule out ovarian torsion.  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 This has benign characteristics, but given its size, follow up by ultrasound is recommended in 6 months.   Start PPI.  Avoid alcohol, caffeine, spicy foods, NSAID medication. Follow-up with gynecology.  Patient also concerned about her facial pain and sinus congestion ongoing for many months.  Will give nasal steroid and empiric antibiotics for possible sinusitis   Glynn Octave, MD 06/09/20 [phone]

## 2020-06-09 NOTE — Discharge Instructions (Signed)
Your imaging today shows that you have a large cyst in your left ovary.  You should follow-up with the oncologist regarding this and have another ultrasound in 6 months.  Take the stomach medication as prescribed.  Avoid alcohol, caffeine, NSAID medications and spicy foods.  Establish care with a primary doctor.  Return to the ED with new or worsening symptoms

## 2020-06-09 NOTE — ED Notes (Signed)
Pt given water to drink. 

## 2020-06-10 ENCOUNTER — Encounter (HOSPITAL_COMMUNITY): Payer: Self-pay

## 2020-06-10 ENCOUNTER — Other Ambulatory Visit: Payer: Self-pay

## 2020-06-10 ENCOUNTER — Emergency Department (HOSPITAL_COMMUNITY)
Admission: EM | Admit: 2020-06-10 | Discharge: 2020-06-10 | Disposition: A | Discharge status: Home / Self Care | Payer: Medicaid Other | Attending: Emergency Medicine | Admitting: Emergency Medicine

## 2020-06-10 DIAGNOSIS — Z5321 Procedure and treatment not carried out due to patient leaving prior to being seen by health care provider: Secondary | ICD-10-CM | POA: Insufficient documentation

## 2020-06-10 DIAGNOSIS — R519 Headache, unspecified: Secondary | ICD-10-CM | POA: Diagnosis not present

## 2020-06-10 DIAGNOSIS — N83209 Unspecified ovarian cyst, unspecified side: Secondary | ICD-10-CM | POA: Insufficient documentation

## 2020-06-10 DIAGNOSIS — R109 Unspecified abdominal pain: Secondary | ICD-10-CM | POA: Insufficient documentation

## 2020-06-10 LAB — GC/CHLAMYDIA PROBE AMP (~~LOC~~) NOT AT ARMC
Chlamydia: NEGATIVE
Comment: NEGATIVE
Comment: NORMAL
Neisseria Gonorrhea: NEGATIVE

## 2020-06-10 NOTE — ED Triage Notes (Signed)
Pt to er, pt states that she was here the other day for the same, states that she is here for abd pain.  States that she was dx with an ovarian cyst.  Pt states that since she was seen she has been having increased pain.  Pt states that she also has some sinus pain/face pain.

## 2020-09-01 ENCOUNTER — Ambulatory Visit: Payer: Medicaid Other | Admitting: Family Medicine

## 2020-09-08 ENCOUNTER — Encounter: Payer: Medicaid Other | Admitting: Adult Health

## 2020-10-23 ENCOUNTER — Encounter (HOSPITAL_COMMUNITY): Payer: Self-pay

## 2020-10-23 ENCOUNTER — Emergency Department (HOSPITAL_COMMUNITY)
Admission: EM | Admit: 2020-10-23 | Discharge: 2020-10-23 | Disposition: A | Discharge status: Home / Self Care | Payer: Medicaid Other | Attending: Emergency Medicine | Admitting: Emergency Medicine

## 2020-10-23 ENCOUNTER — Other Ambulatory Visit: Payer: Self-pay

## 2020-10-23 DIAGNOSIS — E119 Type 2 diabetes mellitus without complications: Secondary | ICD-10-CM | POA: Diagnosis not present

## 2020-10-23 DIAGNOSIS — Z794 Long term (current) use of insulin: Secondary | ICD-10-CM | POA: Diagnosis not present

## 2020-10-23 DIAGNOSIS — J45909 Unspecified asthma, uncomplicated: Secondary | ICD-10-CM | POA: Diagnosis not present

## 2020-10-23 DIAGNOSIS — Z79899 Other long term (current) drug therapy: Secondary | ICD-10-CM | POA: Diagnosis not present

## 2020-10-23 DIAGNOSIS — Z7984 Long term (current) use of oral hypoglycemic drugs: Secondary | ICD-10-CM | POA: Diagnosis not present

## 2020-10-23 DIAGNOSIS — Z7951 Long term (current) use of inhaled steroids: Secondary | ICD-10-CM | POA: Insufficient documentation

## 2020-10-23 DIAGNOSIS — K0889 Other specified disorders of teeth and supporting structures: Secondary | ICD-10-CM | POA: Insufficient documentation

## 2020-10-23 DIAGNOSIS — I10 Essential (primary) hypertension: Secondary | ICD-10-CM | POA: Diagnosis not present

## 2020-10-23 DIAGNOSIS — F17210 Nicotine dependence, cigarettes, uncomplicated: Secondary | ICD-10-CM | POA: Diagnosis not present

## 2020-10-23 LAB — CBG MONITORING, ED: Glucose-Capillary: 220 mg/dL — ABNORMAL HIGH (ref 70–99)

## 2020-10-23 MED ORDER — AMOXICILLIN 500 MG PO CAPS
500.0000 mg | ORAL_CAPSULE | Freq: Three times a day (TID) | ORAL | 0 refills | Status: DC
Start: 2020-10-23 — End: 2021-04-17

## 2020-10-23 MED ORDER — KETOROLAC TROMETHAMINE 30 MG/ML IJ SOLN
30.0000 mg | Freq: Once | INTRAMUSCULAR | Status: AC
  Administered 2020-10-23: 30 mg via INTRAMUSCULAR
  Filled 2020-10-23: qty 1

## 2020-10-23 MED ORDER — AMOXICILLIN 500 MG PO CAPS
500.0000 mg | ORAL_CAPSULE | Freq: Three times a day (TID) | ORAL | 0 refills | Status: DC
Start: 2020-10-23 — End: 2020-10-23

## 2020-10-23 MED ORDER — AMOXICILLIN 250 MG PO CAPS
500.0000 mg | ORAL_CAPSULE | Freq: Three times a day (TID) | ORAL | Status: DC
  Administered 2020-10-23: 500 mg via ORAL
  Filled 2020-10-23: qty 2

## 2020-10-23 NOTE — ED Triage Notes (Deleted)
Pain has gotten worse in the past 3 days. Pt says their are no openings for appointments to be seen at this time.

## 2020-10-23 NOTE — ED Triage Notes (Addendum)
Pt states that teeth on both sides top and bottom are aching x3 days. Pt says she has a appointment to get her cavities removed on the 3rd of Janurary. Pt says their are no openings for appointments to be seen at this time .

## 2020-10-23 NOTE — ED Provider Notes (Signed)
[facility] EMERGENCY DEPARTMENT Provider Note   CSN: [id number] Arrival date & time: 10/23/20  0557   Time seen 6:20 AM    History Chief Complaint  Patient presents with  . Dental Pain    Danielle Zimmerman is a 29 y.o. female.  HPI   Patient states she has had pain in her bilateral posterior molars and her right upper molar for the past 4 to 5 months.  She states she saw her dentist at [facility] and they have referred her to an oral surgeon, Dr. Barbette Merino who she has seen on November 17 and he is going to extract her teeth on January 3.  She states the last 3 days the pain is getting worse.  She has been taking ibuprofen without relief.  She states now her ears are hurting.  She states she has been having swelling on the right side of her face and neck for several months.  She denies any fever.  She denies any difficulty swallowing or breathing.  She states she has not been on antibiotics for several months.  Patient has diabetes and she states her CBGs have been in the 200s although earlier in the year her CBGs were in the 4-500 range.  PCP Donita Brooks, MD   Past Medical History:  Diagnosis Date  . Asthma   . Chronic back pain   . Diabetes mellitus without complication (HCC) 05/2019  . GERD (gastroesophageal reflux disease)   . Hypertension   . Kidney stone   . Nausea and vomiting    recurrent  . Recurrent abdominal pain   . Seasonal allergies     Patient Active Problem List   Diagnosis Date Noted  . Hypertension 03/24/2020  . Type 2 diabetes mellitus with other specified complication (HCC) 03/24/2020  . Adult BMI 31.0-31.9 kg/sq m 03/24/2020  . PTSD (post-traumatic stress disorder) 03/24/2020  . Anxiety and depression 03/24/2020    Past Surgical History:  Procedure Laterality Date  . CESAREAN SECTION    . TONSILLECTOMY AND ADENOIDECTOMY       OB History    Gravida  1   Para      Term      Preterm      AB      Living         SAB      TAB      Ectopic      Multiple      Live Births              No family history on file.  Social History   Tobacco Use  . Smoking status: Current Every Day Smoker    Packs/day: 0.50    Years: 10.00    Pack years: 5.00    Types: Cigarettes  . Smokeless tobacco: Never Used  . Tobacco comment: 2-3 cigarettes daily  Vaping Use  . Vaping Use: Never used  Substance Use Topics  . Alcohol use: No  . Drug use: Not Currently    Types: Marijuana    Home Medications Prior to Admission medications   Medication Sig Start Date End Date Taking? Authorizing Provider  amoxicillin (AMOXIL) 500 MG capsule Take 1 capsule (500 mg total) by mouth 3 (three) times daily. 10/23/20   Devoria Albe, MD  atorvastatin (LIPITOR) 20 MG tablet Take 1 tablet (20 mg total) by mouth daily. 03/24/20   Elmore Guise, FNP  fluticasone (FLONASE) 50 MCG/ACT nasal spray Place 1 spray into  both nostrils daily for 7 days. 06/09/20 06/16/20  Rancour, Jeannett Senior, MD  ibuprofen (ADVIL) 600 MG tablet Take 1 tablet (600 mg total) by mouth every 6 (six) hours as needed. 06/09/20   Rancour, Jeannett Senior, MD  Insulin Pen Needle 32G X 4 MM MISC Use as directed with insulin daily Patient not taking: Reported on 06/08/2020 05/10/20   Elmore Guise, FNP  LANTUS SOLOSTAR 100 UNIT/ML Solostar Pen Inject 40 Units into the skin at bedtime. Patient taking differently: Inject 50 Units into the skin at bedtime.  03/24/20   Elmore Guise, FNP  losartan-hydrochlorothiazide (HYZAAR) 50-12.5 MG tablet Take 1 tablet by mouth daily. 03/24/20   Elmore Guise, FNP  metFORMIN (GLUCOPHAGE) 500 MG tablet Take 1 tablet (500 mg total) by mouth 2 (two) times daily with a meal. 03/24/20 10/20/20  Lawson Fiscal A, FNP  omeprazole (PRILOSEC) 20 MG capsule Take 1 capsule (20 mg total) by mouth daily. 06/09/20   Rancour, Jeannett Senior, MD  PARoxetine (PAXIL) 20 MG tablet Take 1 tablet (20 mg total) by mouth daily. 05/10/20   Elmore Guise, FNP     Allergies    Hydrocodone  Review of Systems   Review of Systems  All other systems reviewed and are negative.   Physical Exam Updated Vital Signs BP (!) 121/93 (BP Location: Left Arm)   Pulse 85   Temp 98.8 F (37.1 C) (Oral)   Resp 18   Ht 5\' 7"  (1.702 m)   Wt 104.3 kg   SpO2 99%   BMI 36.02 kg/m   Physical Exam Vitals and nursing note reviewed.  Constitutional:      General: She is not in acute distress.    Appearance: Normal appearance. She is obese.  HENT:     Head: Normocephalic and atraumatic.     Right Ear: External ear normal.     Left Ear: External ear normal.     Mouth/Throat:     Comments: Surprisingly when I look at her teeth there are not any large cavities that I can appreciate.  There is a very tiny cavity on the anterior portion of #17, there are no obvious large cavities that I can appreciate on #1 or 32.  There is minimal gum redness or swelling in the same areas. Eyes:     Extraocular Movements: Extraocular movements intact.     Pupils: Pupils are equal, round, and reactive to light.  Neck:     Comments: There was no obvious swelling of her face or neck or induration to palpation in the face or neck. Cardiovascular:     Rate and Rhythm: Normal rate and regular rhythm.     Pulses: Normal pulses.     Heart sounds: Normal heart sounds.  Pulmonary:     Effort: Pulmonary effort is normal. No respiratory distress.     Breath sounds: Normal breath sounds.  Musculoskeletal:     Cervical back: Normal range of motion.  Lymphadenopathy:     Cervical: No cervical adenopathy.  Neurological:     General: No focal deficit present.     Mental Status: She is alert and oriented to person, place, and time.     Cranial Nerves: No cranial nerve deficit.  Psychiatric:        Mood and Affect: Affect is flat.        Speech: Speech normal.        Behavior: Behavior normal. Behavior is cooperative.     ED Results / Procedures /  Treatments   Labs (all labs  ordered are listed, but only abnormal results are displayed) Labs Reviewed  CBG MONITORING, ED - Abnormal; Notable for the following components:      Result Value   Glucose-Capillary 220 (*)    All other components within normal limits   Laboratory interpretation all normal except nonfasting hyperglycemia   EKG None  Radiology No results found.  Procedures Procedures (including critical care time)  Medications Ordered in ED Medications  amoxicillin (AMOXIL) capsule 500 mg (500 mg Oral Given 10/23/20 0642)  ketorolac (TORADOL) 30 MG/ML injection 30 mg (30 mg Intramuscular Given 10/23/20 5277)    ED Course  I have reviewed the triage vital signs and the nursing notes.  Pertinent labs & imaging results that were available during my care of the patient were reviewed by me and considered in my medical decision making (see chart for details).    MDM Rules/Calculators/A&P                          Reviewing patient's labs from July 7 her BUN was 11 and her creatinine was 0.71.  She was given Toradol IM for pain.  She was advised we cannot prescribe her anything stronger from the ED for dental pain.  She also was started on amoxicillin in case there is underlying infection.  Review of the [place] shows she got #12 Tylenol 3 from her dentist on September 28 and again on October 25.   Final Clinical Impression(s) / ED Diagnoses Final diagnoses:  Pain, dental    Rx / DC Orders ED Discharge Orders         Ordered    amoxicillin (AMOXIL) 500 MG capsule  3 times daily,   Status:  Discontinued        10/23/20 0643    amoxicillin (AMOXIL) 500 MG capsule  3 times daily        10/23/20 0643        OTC ibuprofen and acetaminophen  Plan discharge  Devoria Albe, MD, Concha Pyo, MD 10/23/20 [phone]

## 2020-10-23 NOTE — Discharge Instructions (Addendum)
Use ice packs on your face for comfort.  Take ibuprofen 600 mg plus acetaminophen 1000 mg every 6 hours as needed for pain.  Take the antibiotics until gone.  Please let your dentist know if you are improving over the next 48 hours, he may need to recheck you before you see the oral surgeon to have your teeth extracted.  Return to the emergency department if you get fever, marked swelling of your face or neck or you have difficulty breathing or swallowing.

## 2020-11-15 ENCOUNTER — Ambulatory Visit: Payer: Self-pay | Admitting: Family Medicine

## 2020-12-08 ENCOUNTER — Ambulatory Visit: Payer: Medicaid Other | Admitting: Internal Medicine

## 2020-12-08 NOTE — Progress Notes (Deleted)
Name: Danielle Zimmerman  MRN/ DOB: [id number], [birth year]   Age/ Sex: 30 y.o., female    PCP: Donita Brooks, MD   Reason for Endocrinology Evaluation: Type {NUMBERS 1 OR 2:522190} Diabetes Mellitus     Date of Initial Endocrinology Visit: 12/08/2020     PATIENT IDENTIFIER: Danielle Zimmerman is a 30 y.o. female with a past medical history of ***. The patient presented for initial endocrinology clinic visit on 12/08/2020 for consultative assistance with her diabetes management.    HPI: Danielle Zimmerman was    Diagnosed with DM *** Prior Medications tried/Intolerance: *** Currently checking blood sugars *** x / day,  before breakfast and ***.  Hypoglycemia episodes : ***               Symptoms: ***                 Frequency: ***/  Hemoglobin A1c has ranged from *** in ***, peaking at *** in ***. Patient required assistance for hypoglycemia:  Patient has required hospitalization within the last 1 year from hyper or hypoglycemia:   In terms of diet, the patient ***   HOME DIABETES REGIMEN: Basal: ***  Bolus: ***   Statin: {Yes/No:11203} ACE-I/ARB: {YES/NO:17245} Prior Diabetic Education: {Yes/No:11203}   METER DOWNLOAD SUMMARY: Date range evaluated: *** Fingerstick Blood Glucose Tests = *** Average Number Tests/Day = *** Overall Mean FS Glucose = *** Standard Deviation = ***  BG Ranges: Low = *** High = ***   Hypoglycemic Events/30 Days: BG < 50 = *** Episodes of symptomatic severe hypoglycemia = ***   DIABETIC COMPLICATIONS: Microvascular complications:   ***  Denies: ***  Last eye exam: Completed   Macrovascular complications:   ***  Denies: CAD, PVD, CVA   PAST HISTORY: Past Medical History:  Past Medical History:  Diagnosis Date  . Asthma   . Chronic back pain   . Diabetes mellitus without complication (HCC) 05/2019  . GERD (gastroesophageal reflux disease)   . Hypertension   . Kidney stone   . Nausea and vomiting    recurrent  . Recurrent  abdominal pain   . Seasonal allergies     Past Surgical History:  Past Surgical History:  Procedure Laterality Date  . CESAREAN SECTION    . TONSILLECTOMY AND ADENOIDECTOMY        Social History:  reports that she has been smoking cigarettes. She has a 5.00 pack-year smoking history. She has never used smokeless tobacco. She reports previous drug use. Drug: Marijuana. She reports that she does not drink alcohol.  Family History: No family history on file.   HOME MEDICATIONS: Allergies as of 12/08/2020      Reactions   Hydrocodone Nausea And Vomiting      Medication List       Accurate as of December 08, 2020  7:23 AM. If you have any questions, ask your nurse or doctor.        amoxicillin 500 MG capsule Commonly known as: AMOXIL Take 1 capsule (500 mg total) by mouth 3 (three) times daily.   atorvastatin 20 MG tablet Commonly known as: LIPITOR Take 1 tablet (20 mg total) by mouth daily.   fluticasone 50 MCG/ACT nasal spray Commonly known as: FLONASE Place 1 spray into both nostrils daily for 7 days.   ibuprofen 600 MG tablet Commonly known as: ADVIL Take 1 tablet (600 mg total) by mouth every 6 (six) hours as needed.   Insulin Pen Needle 32G X  4 MM Misc Use as directed with insulin daily   Lantus SoloStar 100 UNIT/ML Solostar Pen Generic drug: insulin glargine Inject 40 Units into the skin at bedtime. What changed: how much to take   losartan-hydrochlorothiazide 50-12.5 MG tablet Commonly known as: HYZAAR Take 1 tablet by mouth daily.   metFORMIN 500 MG tablet Commonly known as: Glucophage Take 1 tablet (500 mg total) by mouth 2 (two) times daily with a meal.   omeprazole 20 MG capsule Commonly known as: PRILOSEC Take 1 capsule (20 mg total) by mouth daily.   PARoxetine 20 MG tablet Commonly known as: Paxil Take 1 tablet (20 mg total) by mouth daily.        ALLERGIES: Allergies  Allergen Reactions  . Hydrocodone Nausea And Vomiting      REVIEW OF SYSTEMS: A comprehensive ROS was conducted with the patient and is negative except as per HPI and below:  ROS    OBJECTIVE:   VITAL SIGNS: There were no vitals taken for this visit.   PHYSICAL EXAM:  General: Pt appears well and is in NAD  Hydration: Well-hydrated with moist mucous membranes and good skin turgor  HEENT: Head: Unremarkable with good dentition. Oropharynx clear without exudate.  Eyes: External eye exam normal without stare, lid lag or exophthalmos.  EOM intact.  PERRL.  Neck: General: Supple without adenopathy or carotid bruits. Thyroid: Thyroid size normal.  No goiter or nodules appreciated. No thyroid bruit.  Lungs: Clear with good BS bilat with no rales, rhonchi, or wheezes  Heart: RRR with normal S1 and S2 and no gallops; no murmurs; no rub  Abdomen: Normoactive bowel sounds, soft, nontender, without masses or organomegaly palpable  Extremities:  Lower extremities - No pretibial edema. No lesions.  Skin: Normal texture and temperature to palpation. No rash noted. No Acanthosis nigricans/skin tags. No lipohypertrophy.  Neuro: MS is good with appropriate affect, pt is alert and Ox3    DM foot exam:    DATA REVIEWED:  Lab Results  Component Value Date   HGBA1C >14.0 (H) 03/24/2020   Lab Results  Component Value Date   MICROALBUR 0.3 03/24/2020   LDLCALC  03/24/2020     Comment:     . LDL cholesterol not calculated. Triglyceride levels greater than 400 mg/dL invalidate calculated LDL results. . Reference range: <100 . Desirable range <100 mg/dL for primary prevention;   <70 mg/dL for patients with CHD or diabetic patients  with > or = 2 CHD risk factors. Marland Kitchen LDL-C is now calculated using the Martin-Hopkins  calculation, which is a validated novel method providing  better accuracy than the Friedewald equation in the  estimation of LDL-C.  Horald Pollen et al. Lenox Ahr. 8119;147(82): [phone]   (http://education.[facility].com/faq/FAQ164)    CREATININE 0.71 06/08/2020   No results found for: [facility]  Lab Results  Component Value Date   CHOL 224 (H) 03/24/2020   HDL 33 (L) 03/24/2020   LDLCALC  03/24/2020     Comment:     . LDL cholesterol not calculated. Triglyceride levels greater than 400 mg/dL invalidate calculated LDL results. . Reference range: <100 . Desirable range <100 mg/dL for primary prevention;   <70 mg/dL for patients with CHD or diabetic patients  with > or = 2 CHD risk factors. Marland Kitchen LDL-C is now calculated using the Martin-Hopkins  calculation, which is a validated novel method providing  better accuracy than the Friedewald equation in the  estimation of LDL-C.  Horald Pollen et al. Lenox Ahr. 9562;130(86): [phone]  (  http://education.[facility].com/faq/FAQ164)    TRIG 573 (H) 03/24/2020   CHOLHDL 6.8 (H) 03/24/2020        ASSESSMENT / PLAN / RECOMMENDATIONS:   1) Type *** Diabetes Mellitus, ***controlled, With*** complications - Most recent A1c of *** %. Goal A1c < *** %.  ***  Plan: GENERAL:  ***  MEDICATIONS:  ***  EDUCATION / INSTRUCTIONS:  BG monitoring instructions: Patient is instructed to check her blood sugars *** times a day, ***.  Call [facility] if: BG persistently < 70 or > 300. . I reviewed the Rule of 15 for the treatment of hypoglycemia in detail with the patient. Literature supplied.   2) Diabetic complications:   Eye: Does *** have known diabetic retinopathy.   Neuro/ Feet: Does *** have known diabetic peripheral neuropathy.  Renal: Patient does *** have known baseline CKD. She is *** on an ACEI/ARB at present.Check urine albumin/creatinine ratio yearly starting at time of diagnosis. If albuminuria is positive, treatment is geared toward better glucose, blood pressure control and use of ACE inhibitors or ARBs. Monitor electrolytes and creatinine once to twice yearly.   3) Lipids:  Patient is *** on a statin.    4) Hypertension: ***  at goal of < 140/90 mmHg.       Signed electronically by: Mack Guise, MD  [facility]  [facility] [address] [place], [place] [address] Phone: [phone] FAX: [phone]   CC: Susy Frizzle, MD [address] [place] [address] Phone: [phone]  Fax: [phone]    Return to Endocrinology clinic as below: Future Appointments  Date Time Provider [place]  12/08/2020  9:30 AM Atari Novick, Melanie Crazier, MD [facility] None

## 2021-02-08 ENCOUNTER — Emergency Department (HOSPITAL_COMMUNITY): Payer: Medicaid Other

## 2021-02-08 ENCOUNTER — Other Ambulatory Visit: Payer: Self-pay

## 2021-02-08 ENCOUNTER — Emergency Department (HOSPITAL_COMMUNITY)
Admission: EM | Admit: 2021-02-08 | Discharge: 2021-02-08 | Disposition: A | Discharge status: Home / Self Care | Payer: Medicaid Other | Attending: Emergency Medicine | Admitting: Emergency Medicine

## 2021-02-08 ENCOUNTER — Encounter (HOSPITAL_COMMUNITY): Payer: Self-pay | Admitting: Emergency Medicine

## 2021-02-08 DIAGNOSIS — Z794 Long term (current) use of insulin: Secondary | ICD-10-CM | POA: Insufficient documentation

## 2021-02-08 DIAGNOSIS — Z7951 Long term (current) use of inhaled steroids: Secondary | ICD-10-CM | POA: Diagnosis not present

## 2021-02-08 DIAGNOSIS — Z79899 Other long term (current) drug therapy: Secondary | ICD-10-CM | POA: Diagnosis not present

## 2021-02-08 DIAGNOSIS — N3001 Acute cystitis with hematuria: Secondary | ICD-10-CM | POA: Diagnosis not present

## 2021-02-08 DIAGNOSIS — Z20822 Contact with and (suspected) exposure to covid-19: Secondary | ICD-10-CM | POA: Diagnosis not present

## 2021-02-08 DIAGNOSIS — Z87891 Personal history of nicotine dependence: Secondary | ICD-10-CM | POA: Diagnosis not present

## 2021-02-08 DIAGNOSIS — Z7984 Long term (current) use of oral hypoglycemic drugs: Secondary | ICD-10-CM | POA: Insufficient documentation

## 2021-02-08 DIAGNOSIS — E1165 Type 2 diabetes mellitus with hyperglycemia: Secondary | ICD-10-CM | POA: Diagnosis not present

## 2021-02-08 DIAGNOSIS — R300 Dysuria: Secondary | ICD-10-CM | POA: Diagnosis present

## 2021-02-08 DIAGNOSIS — I10 Essential (primary) hypertension: Secondary | ICD-10-CM | POA: Insufficient documentation

## 2021-02-08 DIAGNOSIS — J45909 Unspecified asthma, uncomplicated: Secondary | ICD-10-CM | POA: Insufficient documentation

## 2021-02-08 LAB — RESP PANEL BY RT-PCR (FLU A&B, COVID) ARPGX2
Influenza A by PCR: NEGATIVE
Influenza B by PCR: NEGATIVE
SARS Coronavirus 2 by RT PCR: NEGATIVE

## 2021-02-08 LAB — URINALYSIS, ROUTINE W REFLEX MICROSCOPIC
Bilirubin Urine: NEGATIVE
Glucose, UA: >=500 mg/dL — AB
Hgb urine dipstick: NEGATIVE
Ketones, ur: 20 mg/dL — AB
Nitrite: POSITIVE — AB
Protein, ur: NEGATIVE mg/dL
Specific Gravity, Urine: 1.033 — ABNORMAL HIGH (ref 1.005–1.030)
pH: 6 (ref 5.0–8.0)

## 2021-02-08 LAB — COMPREHENSIVE METABOLIC PANEL
ALT: 118 U/L — ABNORMAL HIGH (ref 0–44)
AST: 86 U/L — ABNORMAL HIGH (ref 15–41)
Albumin: 4.1 g/dL (ref 3.5–5.0)
Alkaline Phosphatase: 96 U/L (ref 38–126)
Anion gap: 13 (ref 5–15)
BUN: 8 mg/dL (ref 6–20)
CO2: 20 mmol/L — ABNORMAL LOW (ref 22–32)
Calcium: 9.4 mg/dL (ref 8.9–10.3)
Chloride: 100 mmol/L (ref 98–111)
Creatinine, Ser: 0.61 mg/dL (ref 0.44–1.00)
GFR, Estimated: >60 mL/min (ref >60)
Glucose, Bld: 361 mg/dL — ABNORMAL HIGH (ref 70–99)
Potassium: 3.5 mmol/L (ref 3.5–5.1)
Sodium: 133 mmol/L — ABNORMAL LOW (ref 135–145)
Total Bilirubin: 1.1 mg/dL (ref 0.3–1.2)
Total Protein: 7.8 g/dL (ref 6.5–8.1)

## 2021-02-08 LAB — CBC WITH DIFFERENTIAL/PLATELET
Abs Immature Granulocytes: 0.05 10*3/uL (ref 0.00–0.07)
Basophils Absolute: 0.1 10*3/uL (ref 0.0–0.1)
Basophils Relative: 1 %
Eosinophils Absolute: 0.4 10*3/uL (ref 0.0–0.5)
Eosinophils Relative: 4 %
HCT: 42.1 % (ref 36.0–46.0)
Hemoglobin: 14 g/dL (ref 12.0–15.0)
Immature Granulocytes: 1 %
Lymphocytes Relative: 28 %
Lymphs Abs: 2.5 10*3/uL (ref 0.7–4.0)
MCH: 29 pg (ref 26.0–34.0)
MCHC: 33.3 g/dL (ref 30.0–36.0)
MCV: 87.3 fL (ref 80.0–100.0)
Monocytes Absolute: 0.6 10*3/uL (ref 0.1–1.0)
Monocytes Relative: 7 %
Neutro Abs: 5.3 10*3/uL (ref 1.7–7.7)
Neutrophils Relative %: 59 %
Platelets: 415 10*3/uL — ABNORMAL HIGH (ref 150–400)
RBC: 4.82 MIL/uL (ref 3.87–5.11)
RDW: 12.4 % (ref 11.5–15.5)
WBC: 8.9 10*3/uL (ref 4.0–10.5)
nRBC: 0 % (ref 0.0–0.2)

## 2021-02-08 LAB — CBG MONITORING, ED
Glucose-Capillary: 356 mg/dL — ABNORMAL HIGH (ref 70–99)
Glucose-Capillary: 292 mg/dL — ABNORMAL HIGH (ref 70–99)

## 2021-02-08 MED ORDER — INSULIN ASPART 100 UNIT/ML ~~LOC~~ SOLN
5.0000 [IU] | Freq: Once | SUBCUTANEOUS | Status: AC
  Administered 2021-02-08: 5 [IU] via SUBCUTANEOUS
  Filled 2021-02-08: qty 1

## 2021-02-08 MED ORDER — ONDANSETRON HCL 4 MG/2ML IJ SOLN
4.0000 mg | Freq: Once | INTRAMUSCULAR | Status: AC
  Administered 2021-02-08: 4 mg via INTRAVENOUS
  Filled 2021-02-08: qty 2

## 2021-02-08 MED ORDER — CEPHALEXIN 500 MG PO CAPS: 500.0000 mg | ORAL_CAPSULE | Freq: Four times a day (QID) | ORAL | 0 refills | Status: DC

## 2021-02-08 MED ORDER — SODIUM CHLORIDE 0.9 % IV BOLUS (SEPSIS)
1000.0000 mL | Freq: Once | INTRAVENOUS | Status: AC
  Administered 2021-02-08: 1000 mL via INTRAVENOUS

## 2021-02-08 MED ORDER — KETOROLAC TROMETHAMINE 30 MG/ML IJ SOLN
30.0000 mg | Freq: Once | INTRAMUSCULAR | Status: AC
  Administered 2021-02-08: 30 mg via INTRAVENOUS
  Filled 2021-02-08: qty 1

## 2021-02-08 MED ORDER — ONDANSETRON 4 MG PO TBDP: ORAL_TABLET | ORAL | 0 refills | Status: DC

## 2021-02-08 MED ORDER — CEPHALEXIN 500 MG PO CAPS
1000.0000 mg | ORAL_CAPSULE | Freq: Once | ORAL | Status: AC
  Administered 2021-02-08: 1000 mg via ORAL
  Filled 2021-02-08: qty 2

## 2021-02-08 NOTE — Discharge Instructions (Signed)
Drink plenty of fluids.  Follow-up with your doctor next week °

## 2021-02-08 NOTE — ED Provider Notes (Addendum)
[facility] EMERGENCY DEPARTMENT Provider Note   CSN: [id number] Arrival date & time: 02/08/21  1634     History Chief Complaint  Patient presents with  . Hyperglycemia    Danielle Zimmerman is a 30 y.o. female.  Patient with diabetes.  She is complaining of vomiting and elevated sugar..  No fever no chills no pain  The history is provided by the patient and medical records. No language interpreter was used.  Hyperglycemia Severity:  Moderate Onset quality:  Sudden Timing:  Constant Progression:  Worsening Chronicity:  Recurrent Diabetes status:  Controlled with oral medications Context: not change in medication   Relieved by:  None tried Associated symptoms: vomiting   Associated symptoms: no abdominal pain, no chest pain and no fatigue        Past Medical History:  Diagnosis Date  . Asthma   . Chronic back pain   . Diabetes mellitus without complication (HCC) 05/2019  . GERD (gastroesophageal reflux disease)   . Hypertension   . Kidney stone   . Nausea and vomiting    recurrent  . Recurrent abdominal pain   . Seasonal allergies     Patient Active Problem List   Diagnosis Date Noted  . Hypertension 03/24/2020  . Type 2 diabetes mellitus with other specified complication (HCC) 03/24/2020  . Adult BMI 31.0-31.9 kg/sq m 03/24/2020  . PTSD (post-traumatic stress disorder) 03/24/2020  . Anxiety and depression 03/24/2020    Past Surgical History:  Procedure Laterality Date  . CESAREAN SECTION    . TONSILLECTOMY AND ADENOIDECTOMY       OB History    Gravida  1   Para      Term      Preterm      AB      Living        SAB      IAB      Ectopic      Multiple      Live Births              History reviewed. No pertinent family history.  Social History   Tobacco Use  . Smoking status: Former Smoker    Packs/day: 0.50    Years: 10.00    Pack years: 5.00    Types: Cigarettes    Quit date: 08/11/2020    Years since quitting: 0.4  .  Smokeless tobacco: Never Used  . Tobacco comment: 2-3 cigarettes daily  Vaping Use  . Vaping Use: Never used  Substance Use Topics  . Alcohol use: No  . Drug use: Not Currently    Types: Marijuana    Comment: occ    Home Medications Prior to Admission medications   Medication Sig Start Date End Date Taking? Authorizing Provider  atorvastatin (LIPITOR) 20 MG tablet Take 1 tablet (20 mg total) by mouth daily. 03/24/20  Yes Bates, Crystal A, FNP  cephALEXin (KEFLEX) 500 MG capsule Take 1 capsule (500 mg total) by mouth 4 (four) times daily. 02/08/21  Yes Bethann Berkshire, MD  ibuprofen (ADVIL) 600 MG tablet Take 1 tablet (600 mg total) by mouth every 6 (six) hours as needed. 06/09/20  Yes Rancour, Jeannett Senior, MD  LANTUS SOLOSTAR 100 UNIT/ML Solostar Pen Inject 40 Units into the skin at bedtime. Patient taking differently: Inject 60 Units into the skin at bedtime. 03/24/20  Yes Bates, Crystal A, FNP  losartan-hydrochlorothiazide (HYZAAR) 50-12.5 MG tablet Take 1 tablet by mouth daily. 03/24/20  Yes Elmore Guise, FNP  metFORMIN (GLUCOPHAGE) 500 MG tablet Take 1 tablet (500 mg total) by mouth 2 (two) times daily with a meal. Patient taking differently: Take 500 mg by mouth 3 (three) times daily before meals. 03/24/20 10/20/20 Yes Bates, Crystal A, FNP  ondansetron (ZOFRAN ODT) 4 MG disintegrating tablet 4mg  ODT q4 hours prn nausea/vomit 02/08/21  Yes 04/10/21, MD  amoxicillin (AMOXIL) 500 MG capsule Take 1 capsule (500 mg total) by mouth 3 (three) times daily. Patient not taking: No sig reported 10/23/20   10/25/20, MD  fluticasone [facility]) 50 MCG/ACT nasal spray Place 1 spray into both nostrils daily for 7 days. 06/09/20 06/16/20  Rancour, 06/18/20, MD  Insulin Pen Needle 32G X 4 MM MISC Use as directed with insulin daily Patient not taking: Reported on 06/08/2020 05/10/20   07/10/20, FNP  omeprazole (PRILOSEC) 20 MG capsule Take 1 capsule (20 mg total) by mouth daily. Patient not taking: No  sig reported 06/09/20   Rancour, 08/10/20, MD  PARoxetine (PAXIL) 20 MG tablet Take 1 tablet (20 mg total) by mouth daily. Patient not taking: Reported on 02/08/2021 05/10/20   07/10/20, FNP    Allergies    Hydrocodone  Review of Systems   Review of Systems  Constitutional: Negative for appetite change and fatigue.  HENT: Negative for congestion, ear discharge and sinus pressure.   Eyes: Negative for discharge.  Respiratory: Negative for cough.   Cardiovascular: Negative for chest pain.  Gastrointestinal: Positive for vomiting. Negative for abdominal pain and diarrhea.  Genitourinary: Negative for frequency and hematuria.  Musculoskeletal: Negative for back pain.  Skin: Negative for rash.  Neurological: Negative for seizures and headaches.  Psychiatric/Behavioral: Negative for hallucinations.    Physical Exam Updated Vital Signs BP 128/81   Pulse 97   Temp 98.3 F (36.8 C) (Oral)   Resp 19   Ht 5\' 7"  (1.702 m)   Wt 108.9 kg   LMP 01/30/2021 Comment: pt states she was late  SpO2 95%   BMI 37.59 kg/m   Physical Exam Vitals and nursing note reviewed.  Constitutional:      Appearance: She is well-developed.  HENT:     Head: Normocephalic.     Nose: Nose normal.  Eyes:     General: No scleral icterus.    Conjunctiva/sclera: Conjunctivae normal.  Neck:     Thyroid: No thyromegaly.  Cardiovascular:     Rate and Rhythm: Normal rate and regular rhythm.     Heart sounds: No murmur heard. No friction rub. No gallop.   Pulmonary:     Breath sounds: No stridor. No wheezing or rales.  Chest:     Chest wall: No tenderness.  Abdominal:     General: There is no distension.     Tenderness: There is no abdominal tenderness. There is no rebound.  Musculoskeletal:        General: Normal range of motion.     Cervical back: Neck supple.  Lymphadenopathy:     Cervical: No cervical adenopathy.  Skin:    Findings: No erythema or rash.  Neurological:     Mental Status: She  is alert and oriented to person, place, and time.     Motor: No abnormal muscle tone.     Coordination: Coordination normal.  Psychiatric:        Behavior: Behavior normal.     ED Results / Procedures / Treatments   Labs (all labs ordered are listed, but only abnormal results are displayed) Labs  Reviewed  CBC WITH DIFFERENTIAL/PLATELET - Abnormal; Notable for the following components:      Result Value   Platelets 415 (*)    All other components within normal limits  COMPREHENSIVE METABOLIC PANEL - Abnormal; Notable for the following components:   Sodium 133 (*)    CO2 20 (*)    Glucose, Bld 361 (*)    AST 86 (*)    ALT 118 (*)    All other components within normal limits  URINALYSIS, ROUTINE W REFLEX MICROSCOPIC - Abnormal; Notable for the following components:   APPearance HAZY (*)    Specific Gravity, Urine 1.033 (*)    Glucose, UA >=500 (*)    Ketones, ur 20 (*)    Nitrite POSITIVE (*)    Leukocytes,Ua MODERATE (*)    Bacteria, UA MANY (*)    All other components within normal limits  CBG MONITORING, ED - Abnormal; Notable for the following components:   Glucose-Capillary 356 (*)    All other components within normal limits  CBG MONITORING, ED - Abnormal; Notable for the following components:   Glucose-Capillary 292 (*)    All other components within normal limits  RESP PANEL BY RT-PCR (FLU A&B, COVID) ARPGX2  URINE CULTURE    EKG None  Radiology DG Chest Port 1 View  Result Date: 02/08/2021 CLINICAL DATA:  Shortness of breath. EXAM: PORTABLE CHEST 1 VIEW COMPARISON:  Radiograph and CT 03/26/2020 FINDINGS: Low lung volumes.The cardiomediastinal contours are normal. The lungs are clear. Pulmonary vasculature is normal. No consolidation, pleural effusion, or pneumothorax. No acute osseous abnormalities are seen. IMPRESSION: Low lung volumes without acute process. Electronically Signed   By: Narda RutherfordMelanie  Sanford M.D.   On: 02/08/2021 18:14    Procedures Procedures    Medications Ordered in ED Medications  cephALEXin (KEFLEX) capsule 1,000 mg (has no administration in time range)  sodium chloride 0.9 % bolus 1,000 mL (0 mLs Intravenous Stopped 02/08/21 1941)  ondansetron (ZOFRAN) injection 4 mg (4 mg Intravenous Given 02/08/21 1817)  ketorolac (TORADOL) 30 MG/ML injection 30 mg (30 mg Intravenous Given 02/08/21 2017)  sodium chloride 0.9 % bolus 1,000 mL (1,000 mLs Intravenous New Bag/Given 02/08/21 2026)  insulin aspart (novoLOG) injection 5 Units (5 Units Subcutaneous Given 02/08/21 2016)   CRITICAL CARE Performed by: Bethann BerkshireJoseph Joelle Flessner Total critical care time: 35minutes Critical care time was exclusive of separately billable procedures and treating other patients. Critical care was necessary to treat or prevent imminent or life-threatening deterioration. Critical care was time spent personally by me on the following activities: development of treatment plan with patient and/or surrogate as well as nursing, discussions with consultants, evaluation of patient's response to treatment, examination of patient, obtaining history from patient or surrogate, ordering and performing treatments and interventions, ordering and review of laboratory studies, ordering and review of radiographic studies, pulse oximetry and re-evaluation of patient's condition.  ED Course  I have reviewed the triage vital signs and the nursing notes.  Pertinent labs & imaging results that were available during my care of the patient were reviewed by me and considered in my medical decision making (see chart for details).    MDM Rules/Calculators/A&P                          Patient with urinary tract infection and diabetes.  She is not in DKA.  She is sent home with Keflex and Zofran and will follow up with her PCP Final Clinical Impression(s) /  ED Diagnoses Final diagnoses:  Acute cystitis with hematuria    Rx / DC Orders ED Discharge Orders         Ordered    cephALEXin (KEFLEX) 500  MG capsule  4 times daily        02/08/21 2114    ondansetron (ZOFRAN ODT) 4 MG disintegrating tablet        02/08/21 2114           Bethann Berkshire, MD 02/09/21 1007    Bethann Berkshire, MD 02/20/21 1151

## 2021-02-08 NOTE — ED Triage Notes (Signed)
Pt to the ED with hyperglycemia with cbg over 500 at home.  Pt states everything hurts and she feels shaky.

## 2021-02-09 ENCOUNTER — Telehealth: Payer: Self-pay | Admitting: *Deleted

## 2021-02-09 NOTE — Telephone Encounter (Signed)
Transition Care Management Unsuccessful Follow-up Telephone Call  Date of discharge and from where:  02/08/2021 - [facility]  Attempts:  1st Attempt  Reason for unsuccessful TCM follow-up call:  Voice mail full

## 2021-02-10 NOTE — Telephone Encounter (Signed)
Transition Care Management Unsuccessful Follow-up Telephone Call  Date of discharge and from where:  02/08/2021 - [facility]  Attempts:  2nd Attempt  Reason for unsuccessful TCM follow-up call:  Voice mail full

## 2021-02-11 LAB — URINE CULTURE: Culture: >=100000 — AB

## 2021-02-13 NOTE — Telephone Encounter (Signed)
Transition Care Management Follow-up Telephone Call  Date of discharge and from where: 02/08/2021 - [facility]  How have you been since you were released from the hospital? "Feeling better"  Any questions or concerns? No  Items Reviewed:  Did the pt receive and understand the discharge instructions provided? Yes   Medications obtained and verified? Yes   Other? No   Any new allergies since your discharge? No   Dietary orders reviewed? Yes  Do you have support at home? Yes   Home Care and Equipment/Supplies: Were home health services ordered? not applicable If so, what is the name of the agency? N/A  Has the agency set up a time to come to the patient's home? not applicable Were any new equipment or medical supplies ordered?  No What is the name of the medical supply agency? N/A Were you able to get the supplies/equipment? not applicable Do you have any questions related to the use of the equipment or supplies? No  Functional Questionnaire: (I = Independent and D = Dependent) ADLs: I  Bathing/Dressing- I  Meal Prep- I  Eating- I  Maintaining continence- I  Transferring/Ambulation- I  Managing Meds- I  Follow up appointments reviewed:   PCP Hospital f/u appt confirmed? No    Specialist Hospital f/u appt confirmed? No    Are transportation arrangements needed? No   If their condition worsens, is the pt aware to call PCP or go to the Emergency Dept.? Yes  Was the patient provided with contact information for the PCP's office or ED? Yes  Was to pt encouraged to call back with questions or concerns? Yes

## 2021-03-02 ENCOUNTER — Telehealth: Payer: Self-pay | Admitting: *Deleted

## 2021-03-02 NOTE — Telephone Encounter (Signed)
Transition Care Management Follow-up Telephone Call  Date of discharge and from where: 03/01/2021 [facility]  How have you been since you were released from the hospital? "I am fine"  Any questions or concerns? No  Items Reviewed:  Did the pt receive and understand the discharge instructions provided? Yes   Medications obtained and verified? Yes   Other? No   Any new allergies since your discharge? No   Dietary orders reviewed? No  Do you have support at home? Yes     Functional Questionnaire: (I = Independent and D = Dependent) ADLs: I  Bathing/Dressing- I  Meal Prep- I  Eating- I  Maintaining continence- I  Transferring/Ambulation- I  Managing Meds- I  Follow up appointments reviewed:   PCP Hospital f/u appt confirmed? No    Specialist Hospital f/u appt confirmed? No    Are transportation arrangements needed? No   If their condition worsens, is the pt aware to call PCP or go to the Emergency Dept.? Yes  Was the patient provided with contact information for the PCP's office or ED? Yes  Was to pt encouraged to call back with questions or concerns? Yes

## 2021-04-11 ENCOUNTER — Emergency Department (HOSPITAL_COMMUNITY): Payer: Medicaid Other

## 2021-04-11 ENCOUNTER — Other Ambulatory Visit: Payer: Self-pay

## 2021-04-11 ENCOUNTER — Emergency Department (HOSPITAL_COMMUNITY)
Admission: EM | Admit: 2021-04-11 | Discharge: 2021-04-11 | Disposition: A | Discharge status: Home / Self Care | Payer: Medicaid Other | Attending: Emergency Medicine | Admitting: Emergency Medicine

## 2021-04-11 DIAGNOSIS — S022XXA Fracture of nasal bones, initial encounter for closed fracture: Secondary | ICD-10-CM | POA: Diagnosis not present

## 2021-04-11 DIAGNOSIS — J45909 Unspecified asthma, uncomplicated: Secondary | ICD-10-CM | POA: Diagnosis not present

## 2021-04-11 DIAGNOSIS — Z7951 Long term (current) use of inhaled steroids: Secondary | ICD-10-CM | POA: Insufficient documentation

## 2021-04-11 DIAGNOSIS — E119 Type 2 diabetes mellitus without complications: Secondary | ICD-10-CM | POA: Insufficient documentation

## 2021-04-11 DIAGNOSIS — S0083XA Contusion of other part of head, initial encounter: Secondary | ICD-10-CM | POA: Diagnosis not present

## 2021-04-11 DIAGNOSIS — Z794 Long term (current) use of insulin: Secondary | ICD-10-CM | POA: Insufficient documentation

## 2021-04-11 DIAGNOSIS — H1132 Conjunctival hemorrhage, left eye: Secondary | ICD-10-CM | POA: Diagnosis not present

## 2021-04-11 DIAGNOSIS — Z79899 Other long term (current) drug therapy: Secondary | ICD-10-CM | POA: Diagnosis not present

## 2021-04-11 DIAGNOSIS — Z87891 Personal history of nicotine dependence: Secondary | ICD-10-CM | POA: Diagnosis not present

## 2021-04-11 DIAGNOSIS — Z7984 Long term (current) use of oral hypoglycemic drugs: Secondary | ICD-10-CM | POA: Insufficient documentation

## 2021-04-11 DIAGNOSIS — I10 Essential (primary) hypertension: Secondary | ICD-10-CM | POA: Insufficient documentation

## 2021-04-11 DIAGNOSIS — S0992XA Unspecified injury of nose, initial encounter: Secondary | ICD-10-CM | POA: Diagnosis present

## 2021-04-11 MED ORDER — TRAMADOL HCL 50 MG PO TABS
50.0000 mg | ORAL_TABLET | Freq: Once | ORAL | Status: AC
  Administered 2021-04-11: 50 mg via ORAL
  Filled 2021-04-11: qty 1

## 2021-04-11 MED ORDER — FLUORESCEIN SODIUM 1 MG OP STRP
1.0000 | ORAL_STRIP | Freq: Once | OPHTHALMIC | Status: AC
  Administered 2021-04-11: 1 via OPHTHALMIC

## 2021-04-11 MED ORDER — TETRACAINE HCL 0.5 % OP SOLN
2.0000 [drp] | Freq: Once | OPHTHALMIC | Status: AC
  Administered 2021-04-11: 2 [drp] via OPHTHALMIC
  Filled 2021-04-11: qty 4

## 2021-04-11 MED ORDER — TRAMADOL HCL 50 MG PO TABS: 50.0000 mg | ORAL_TABLET | Freq: Four times a day (QID) | ORAL | 0 refills | Status: AC | PRN
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
## 2021-04-17 ENCOUNTER — Other Ambulatory Visit: Payer: Self-pay

## 2021-04-17 ENCOUNTER — Encounter (HOSPITAL_BASED_OUTPATIENT_CLINIC_OR_DEPARTMENT_OTHER): Payer: Self-pay | Admitting: Otolaryngology

## 2021-04-17 ENCOUNTER — Other Ambulatory Visit: Payer: Self-pay | Admitting: Otolaryngology

## 2021-04-19 ENCOUNTER — Other Ambulatory Visit: Payer: Self-pay

## 2021-04-19 ENCOUNTER — Encounter (HOSPITAL_BASED_OUTPATIENT_CLINIC_OR_DEPARTMENT_OTHER)
Admission: RE | Admit: 2021-04-19 | Discharge: 2021-04-19 | Disposition: A | Discharge status: Home / Self Care | Payer: Medicaid Other | Source: Ambulatory Visit | Attending: Otolaryngology | Admitting: Otolaryngology

## 2021-04-19 DIAGNOSIS — Z01818 Encounter for other preprocedural examination: Secondary | ICD-10-CM | POA: Diagnosis present

## 2021-04-19 LAB — BASIC METABOLIC PANEL
Anion gap: 9 (ref 5–15)
BUN: 6 mg/dL (ref 6–20)
CO2: 24 mmol/L (ref 22–32)
Calcium: 9.2 mg/dL (ref 8.9–10.3)
Chloride: 101 mmol/L (ref 98–111)
Creatinine, Ser: 0.71 mg/dL (ref 0.44–1.00)
GFR, Estimated: >60 mL/min (ref >60)
Glucose, Bld: 347 mg/dL — ABNORMAL HIGH (ref 70–99)
Potassium: 3.7 mmol/L (ref 3.5–5.1)
Sodium: 134 mmol/L — ABNORMAL LOW (ref 135–145)

## 2021-04-19 LAB — POCT PREGNANCY, URINE: Preg Test, Ur: NEGATIVE

## 2021-04-21 ENCOUNTER — Ambulatory Visit (HOSPITAL_BASED_OUTPATIENT_CLINIC_OR_DEPARTMENT_OTHER)
Admission: RE | Admit: 2021-04-21 | Discharge: 2021-04-21 | Disposition: A | Discharge status: Home / Self Care | Payer: Medicaid Other | Attending: Otolaryngology | Admitting: Otolaryngology

## 2021-04-21 ENCOUNTER — Encounter (HOSPITAL_BASED_OUTPATIENT_CLINIC_OR_DEPARTMENT_OTHER): Payer: Self-pay | Admitting: Otolaryngology

## 2021-04-21 ENCOUNTER — Ambulatory Visit (HOSPITAL_BASED_OUTPATIENT_CLINIC_OR_DEPARTMENT_OTHER): Payer: Medicaid Other | Admitting: Anesthesiology

## 2021-04-21 ENCOUNTER — Encounter (HOSPITAL_BASED_OUTPATIENT_CLINIC_OR_DEPARTMENT_OTHER)
Admission: RE | Disposition: A | Discharge status: Home / Self Care | Payer: Self-pay | Source: Home / Self Care | Attending: Otolaryngology

## 2021-04-21 ENCOUNTER — Other Ambulatory Visit: Payer: Self-pay

## 2021-04-21 DIAGNOSIS — J342 Deviated nasal septum: Secondary | ICD-10-CM | POA: Diagnosis not present

## 2021-04-21 DIAGNOSIS — J310 Chronic rhinitis: Secondary | ICD-10-CM | POA: Insufficient documentation

## 2021-04-21 DIAGNOSIS — S022XXA Fracture of nasal bones, initial encounter for closed fracture: Secondary | ICD-10-CM | POA: Insufficient documentation

## 2021-04-21 DIAGNOSIS — W500XXA Accidental hit or strike by another person, initial encounter: Secondary | ICD-10-CM | POA: Diagnosis not present

## 2021-04-21 DIAGNOSIS — J3489 Other specified disorders of nose and nasal sinuses: Secondary | ICD-10-CM | POA: Diagnosis not present

## 2021-04-21 DIAGNOSIS — F17210 Nicotine dependence, cigarettes, uncomplicated: Secondary | ICD-10-CM | POA: Insufficient documentation

## 2021-04-21 HISTORY — PX: CLOSED REDUCTION NASAL FRACTURE: SHX5365

## 2021-04-21 LAB — GLUCOSE, CAPILLARY
Glucose-Capillary: 277 mg/dL — ABNORMAL HIGH (ref 70–99)
Glucose-Capillary: 256 mg/dL — ABNORMAL HIGH (ref 70–99)

## 2021-04-21 SURGERY — CLOSED REDUCTION, FRACTURE, NASAL BONE
Anesthesia: General | Site: Nose

## 2021-04-21 MED ORDER — PROPOFOL 10 MG/ML IV BOLUS
INTRAVENOUS | Status: AC
  Filled 2021-04-21: qty 20

## 2021-04-21 MED ORDER — PROMETHAZINE HCL 25 MG/ML IJ SOLN
6.2500 mg | Freq: Four times a day (QID) | INTRAMUSCULAR | Status: DC | PRN
  Administered 2021-04-21: 6.25 mg via INTRAVENOUS

## 2021-04-21 MED ORDER — FENTANYL CITRATE (PF) 100 MCG/2ML IJ SOLN
INTRAMUSCULAR | Status: AC
  Filled 2021-04-21: qty 2

## 2021-04-21 MED ORDER — LACTATED RINGERS IV SOLN: INTRAVENOUS | Status: DC

## 2021-04-21 MED ORDER — ACETAMINOPHEN 10 MG/ML IV SOLN
1000.0000 mg | Freq: Four times a day (QID) | INTRAVENOUS | Status: DC
  Administered 2021-04-21: 1000 mg via INTRAVENOUS

## 2021-04-21 MED ORDER — FENTANYL CITRATE (PF) 100 MCG/2ML IJ SOLN
INTRAMUSCULAR | Status: DC | PRN
  Administered 2021-04-21: 100 ug via INTRAVENOUS

## 2021-04-21 MED ORDER — MIDAZOLAM HCL 2 MG/2ML IJ SOLN
INTRAMUSCULAR | Status: AC
  Filled 2021-04-21: qty 2

## 2021-04-21 MED ORDER — ACETAMINOPHEN 10 MG/ML IV SOLN
INTRAVENOUS | Status: AC
  Filled 2021-04-21: qty 100

## 2021-04-21 MED ORDER — FENTANYL CITRATE (PF) 100 MCG/2ML IJ SOLN
25.0000 ug | INTRAMUSCULAR | Status: DC | PRN
  Administered 2021-04-21 (×3): 50 ug via INTRAVENOUS

## 2021-04-21 MED ORDER — DEXAMETHASONE SODIUM PHOSPHATE 4 MG/ML IJ SOLN
INTRAMUSCULAR | Status: DC | PRN
  Administered 2021-04-21: 4 mg via INTRAVENOUS

## 2021-04-21 MED ORDER — PROMETHAZINE HCL 25 MG/ML IJ SOLN
INTRAMUSCULAR | Status: AC
  Filled 2021-04-21: qty 1

## 2021-04-21 MED ORDER — LIDOCAINE 2% (20 MG/ML) 5 ML SYRINGE
INTRAMUSCULAR | Status: DC | PRN
  Administered 2021-04-21: 80 mg via INTRAVENOUS

## 2021-04-21 MED ORDER — PROPOFOL 10 MG/ML IV BOLUS
INTRAVENOUS | Status: DC | PRN
  Administered 2021-04-21: 150 mg via INTRAVENOUS

## 2021-04-21 MED ORDER — MIDAZOLAM HCL 5 MG/5ML IJ SOLN
INTRAMUSCULAR | Status: DC | PRN
  Administered 2021-04-21: 2 mg via INTRAVENOUS

## 2021-04-21 MED ORDER — LIDOCAINE 2% (20 MG/ML) 5 ML SYRINGE
INTRAMUSCULAR | Status: AC
  Filled 2021-04-21: qty 5

## 2021-04-21 MED ORDER — OXYMETAZOLINE HCL 0.05 % NA SOLN
NASAL | Status: DC | PRN
  Administered 2021-04-21: 1 via TOPICAL

## 2021-04-21 MED ORDER — ONDANSETRON HCL 4 MG/2ML IJ SOLN
INTRAMUSCULAR | Status: AC
  Filled 2021-04-21: qty 2

## 2021-04-21 MED ORDER — DEXAMETHASONE SODIUM PHOSPHATE 10 MG/ML IJ SOLN
INTRAMUSCULAR | Status: AC
  Filled 2021-04-21: qty 1

## 2021-04-21 MED ORDER — ONDANSETRON HCL 4 MG/2ML IJ SOLN
INTRAMUSCULAR | Status: DC | PRN
  Administered 2021-04-21: 4 mg via INTRAVENOUS

## 2021-04-21 SURGICAL SUPPLY — 37 items
ADH SKN CLS APL DERMABOND .7 (GAUZE/BANDAGES/DRESSINGS) ×1
APL SKNCLS STERI-STRIP NONHPOA (GAUZE/BANDAGES/DRESSINGS) ×1
BENZOIN TINCTURE PRP APPL 2/3 (GAUZE/BANDAGES/DRESSINGS) ×1 IMPLANT
CANISTER SUCT 1200ML W/VALVE (MISCELLANEOUS) ×2 IMPLANT
CNTNR URN SCR LID CUP LEK RST (MISCELLANEOUS) IMPLANT
CONT SPEC 4OZ STRL OR WHT (MISCELLANEOUS)
COVER BACK TABLE 60X90IN (DRAPES) ×2 IMPLANT
DECANTER SPIKE VIAL GLASS SM (MISCELLANEOUS) IMPLANT
DERMABOND ADVANCED (GAUZE/BANDAGES/DRESSINGS) ×1
DERMABOND ADVANCED .7 DNX12 (GAUZE/BANDAGES/DRESSINGS) IMPLANT
DRESSING NASAL KENNEDY 3.5X.9 (MISCELLANEOUS) IMPLANT
DRSG CURAD 3X16 NADH (PACKING) IMPLANT
DRSG NASAL KENNEDY 3.5X.9 (MISCELLANEOUS)
DRSG TELFA 3X8 NADH (GAUZE/BANDAGES/DRESSINGS) IMPLANT
GAUZE SPONGE 4X4 12PLY STRL LF (GAUZE/BANDAGES/DRESSINGS) ×4 IMPLANT
GLOVE SURG ENC MOIS LTX SZ7.5 (GLOVE) IMPLANT
KIT SPLINT NASAL DENVER LRG BE (GAUZE/BANDAGES/DRESSINGS) IMPLANT
KIT SPLINT NASAL DENVER MIN BE (GAUZE/BANDAGES/DRESSINGS) IMPLANT
KIT SPLINT NASAL DENVER PET BE (GAUZE/BANDAGES/DRESSINGS) IMPLANT
KIT SPLINT NASAL DENVER SM BEI (GAUZE/BANDAGES/DRESSINGS) IMPLANT
MARKER SKIN DUAL TIP RULER LAB (MISCELLANEOUS) IMPLANT
NDL PRECISIONGLIDE 27X1.5 (NEEDLE) IMPLANT
NEEDLE PRECISIONGLIDE 27X1.5 (NEEDLE) IMPLANT
NS IRRIG 1000ML POUR BTL (IV SOLUTION) IMPLANT
PAD DRESSING TELFA 3X8 NADH (GAUZE/BANDAGES/DRESSINGS) IMPLANT
SHEET MEDIUM DRAPE 40X70 STRL (DRAPES) ×2 IMPLANT
SPLINT NASAL DENVER LRG BLUSH (MISCELLANEOUS) ×2 IMPLANT
SPONGE GAUZE 2X2 8PLY STRL LF (GAUZE/BANDAGES/DRESSINGS) IMPLANT
SPONGE NEURO XRAY DETECT 1X3 (DISPOSABLE) ×2 IMPLANT
STRIP SUTURE WOUND CLOSURE 1/2 (MISCELLANEOUS) ×1 IMPLANT
SUT ETHILON 3 0 PS 1 (SUTURE) IMPLANT
SYR CONTROL 10ML LL (SYRINGE) IMPLANT
TAPE PAPER 1/2X10 TAN MEDIPORE (MISCELLANEOUS) ×2 IMPLANT
TOWEL GREEN STERILE FF (TOWEL DISPOSABLE) ×2 IMPLANT
TUBE CONNECTING 20X1/4 (TUBING) ×2 IMPLANT
TUBE SALEM SUMP 12R W/ARV (TUBING) IMPLANT
YANKAUER SUCT BULB TIP NO VENT (SUCTIONS) IMPLANT

## 2021-04-21 NOTE — Discharge Instructions (Addendum)
The patient may resume all her previous activities and diet.  She should leave her nasal splint in place.  No postop restrictions.  The patient has follow-up appointment with me in 1 week.  ------------------------  Excuse from Work, School, or Physical Activity __[name]___ needs to be excused from: _x__ Work. ____ School. ____ Physical activity. This is effective for the following dates: ____5/20/22 - 5/22/22___ due to her surgery. He or she may return to work on  _5/23/22__.  Health care provider : __Su Philomena Doheny, MD___ Date: __5/20/2022________ This information is not intended to replace advice given to you by your health care provider. Make sure you discuss any questions you have with your health care provider. Document Revised: 11/14/2017 Document Reviewed: 11/14/2017 Elsevier Patient Education  2021 [employer]    No tylenol until 6pm   Post Anesthesia Home Care Instructions  Activity: Get plenty of rest for the remainder of the day. A responsible individual must stay with you for 24 hours following the procedure.  For the next 24 hours, DO NOT: -Drive a car -Advertising copywriter -Drink alcoholic beverages -Take any medication unless instructed by your physician -Make any legal decisions or sign important papers.  Meals: Start with liquid foods such as gelatin or soup. Progress to regular foods as tolerated. Avoid greasy, spicy, heavy foods. If nausea and/or vomiting occur, drink only clear liquids until the nausea and/or vomiting subsides. Call your physician if vomiting continues.  Special Instructions/Symptoms: Your throat may feel dry or sore from the anesthesia or the breathing tube placed in your throat during surgery. If this causes discomfort, gargle with warm salt water. The discomfort should disappear within 24 hours.  If you had a scopolamine patch placed behind your ear for the management of post- operative nausea and/or vomiting:  1. The medication  in the patch is effective for 72 hours, after which it should be removed.  Wrap patch in a tissue and discard in the trash. Wash hands thoroughly with soap and water. 2. You may remove the patch earlier than 72 hours if you experience unpleasant side effects which may include dry mouth, dizziness or visual disturbances. 3. Avoid touching the patch. Wash your hands with soap and water after contact with the patch.
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
## 2021-04-21 NOTE — Anesthesia Procedure Notes (Signed)
Procedure Name: LMA Insertion Date/Time: 04/21/2021 10:14 AM Performed by: Burna Cash, CRNA Pre-anesthesia Checklist: Patient identified, Emergency Drugs available, Suction available and Patient being monitored Patient Re-evaluated:Patient Re-evaluated prior to induction Oxygen Delivery Method: Circle system utilized Preoxygenation: Pre-oxygenation with 100% oxygen Induction Type: IV induction Ventilation: Mask ventilation without difficulty LMA: LMA inserted LMA Size: 4.0 Number of attempts: 1 Airway Equipment and Method: Bite block Placement Confirmation: positive ETCO2 Tube secured with: Tape Dental Injury: Teeth and Oropharynx as per pre-operative assessment

## 2021-04-21 NOTE — Anesthesia Preprocedure Evaluation (Signed)
Anesthesia Evaluation  Patient identified by MRN, date of birth, ID band Patient awake    Reviewed: Allergy & Precautions, NPO status , Patient's Chart, lab work & pertinent test results  Airway Mallampati: II  TM Distance: >3 FB     Dental   Pulmonary asthma , Current Smoker and Patient abstained from smoking.,    breath sounds clear to auscultation       Cardiovascular hypertension,  Rhythm:Regular Rate:Normal     Neuro/Psych PSYCHIATRIC DISORDERS Anxiety Depression    GI/Hepatic Neg liver ROS, GERD  ,  Endo/Other  diabetes  Renal/GU Renal disease     Musculoskeletal   Abdominal   Peds  Hematology   Anesthesia Other Findings   Reproductive/Obstetrics                             Anesthesia Physical Anesthesia Plan  ASA: III  Anesthesia Plan: General   Post-op Pain Management:    Induction: Intravenous  PONV Risk Score and Plan: 2 and Ondansetron, Dexamethasone and Midazolam  Airway Management Planned: Oral ETT  Additional Equipment:   Intra-op Plan:   Post-operative Plan: Extubation in OR  Informed Consent: I have reviewed the patients History and Physical, chart, labs and discussed the procedure including the risks, benefits and alternatives for the proposed anesthesia with the patient or authorized representative who has indicated his/her understanding and acceptance.     Dental advisory given  Plan Discussed with: CRNA and Anesthesiologist  Anesthesia Plan Comments:         Anesthesia Quick Evaluation

## 2021-04-21 NOTE — Anesthesia Postprocedure Evaluation (Signed)
Anesthesia Post Note  Patient: Danielle Zimmerman  Procedure(s) Performed: CLOSED REDUCTION NASAL FRACTURE (N/A Nose)     Patient location during evaluation: PACU Anesthesia Type: General Level of consciousness: awake Pain management: pain level controlled Vital Signs Assessment: post-procedure vital signs reviewed and stable Respiratory status: spontaneous breathing Cardiovascular status: stable Postop Assessment: no apparent nausea or vomiting Anesthetic complications: no   No complications documented.  Last Vitals:  Vitals:   04/21/21 0823  BP: 111/84  Pulse: 92  Resp: 18  Temp: 36.8 C  SpO2: 100%    Last Pain:  Vitals:   04/21/21 0823  TempSrc: Oral  PainSc: 5                  Nicollette Wilhelmi
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
## 2021-04-21 NOTE — Anesthesia Postprocedure Evaluation (Signed)
Anesthesia Post Note  Patient: Danielle Zimmerman  Procedure(s) Performed: CLOSED REDUCTION NASAL FRACTURE (N/A Nose)     Patient location during evaluation: PACU Anesthesia Type: General Level of consciousness: awake Pain management: pain level controlled Vital Signs Assessment: post-procedure vital signs reviewed and stable Respiratory status: spontaneous breathing Cardiovascular status: stable Postop Assessment: no apparent nausea or vomiting Anesthetic complications: no   No complications documented.  Last Vitals:  Vitals:   04/21/21 1215 04/21/21 1233  BP: 131/87 136/85  Pulse: 66 72  Resp: 16 18  Temp:  36.7 C  SpO2: 96% 100%    Last Pain:  Vitals:   04/21/21 1233  TempSrc:   PainSc: 3                  Leidi Astle

## 2021-04-21 NOTE — Transfer of Care (Signed)
Immediate Anesthesia Transfer of Care Note  Patient: Danielle Zimmerman  Procedure(s) Performed: CLOSED REDUCTION NASAL FRACTURE (N/A Nose)  Patient Location: PACU  Anesthesia Type:General  Level of Consciousness: sedated  Airway & Oxygen Therapy: Patient Spontanous Breathing and Patient connected to face mask oxygen  Post-op Assessment: Report given to RN and Post -op Vital signs reviewed and stable  Post vital signs: Reviewed and stable  Last Vitals:  Vitals Value Taken Time  BP 139/99 04/21/21 1056  Temp 36.5 C 04/21/21 1056  Pulse 73 04/21/21 1056  Resp 17 04/21/21 1056  SpO2 100 % 04/21/21 1056  Vitals shown include unvalidated device data.  Last Pain:  Vitals:   04/21/21 0823  TempSrc: Oral  PainSc: 5       Patients Stated Pain Goal: 5 (04/21/21 5625)  Complications: No complications documented.

## 2021-04-24 ENCOUNTER — Encounter (HOSPITAL_BASED_OUTPATIENT_CLINIC_OR_DEPARTMENT_OTHER): Payer: Self-pay | Admitting: Otolaryngology

## 2021-04-24 NOTE — Addendum Note (Signed)
Addendum  created 04/24/21 0752 by Burna Cash, CRNA   Charge Capture section accepted

## 2021-07-08 IMAGING — DX DG CHEST 2V
2 series · 2 of 2 positions shown · non-contrast
Comparison: 12/01/2013

CLINICAL DATA: Chest pain.

EXAM:
CHEST - 2 VIEW

[chest pa]
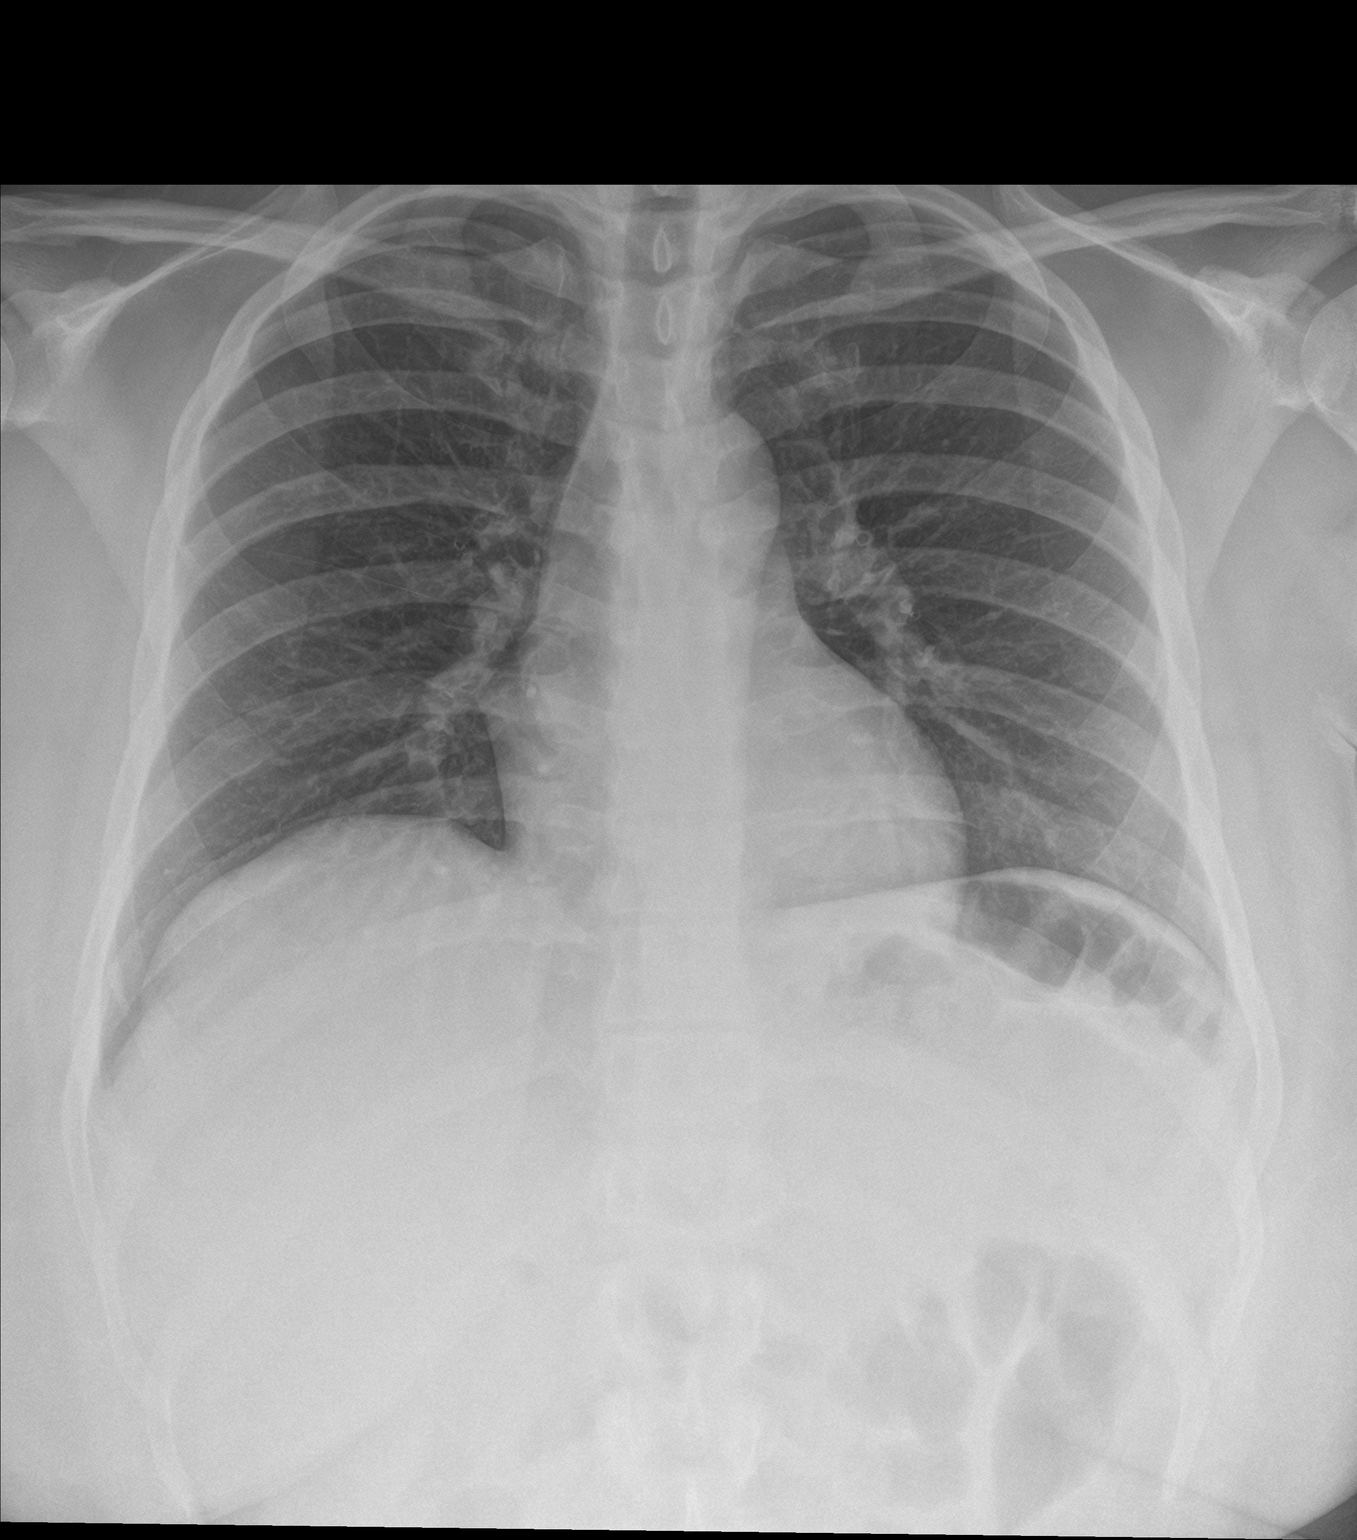

[chest lat]
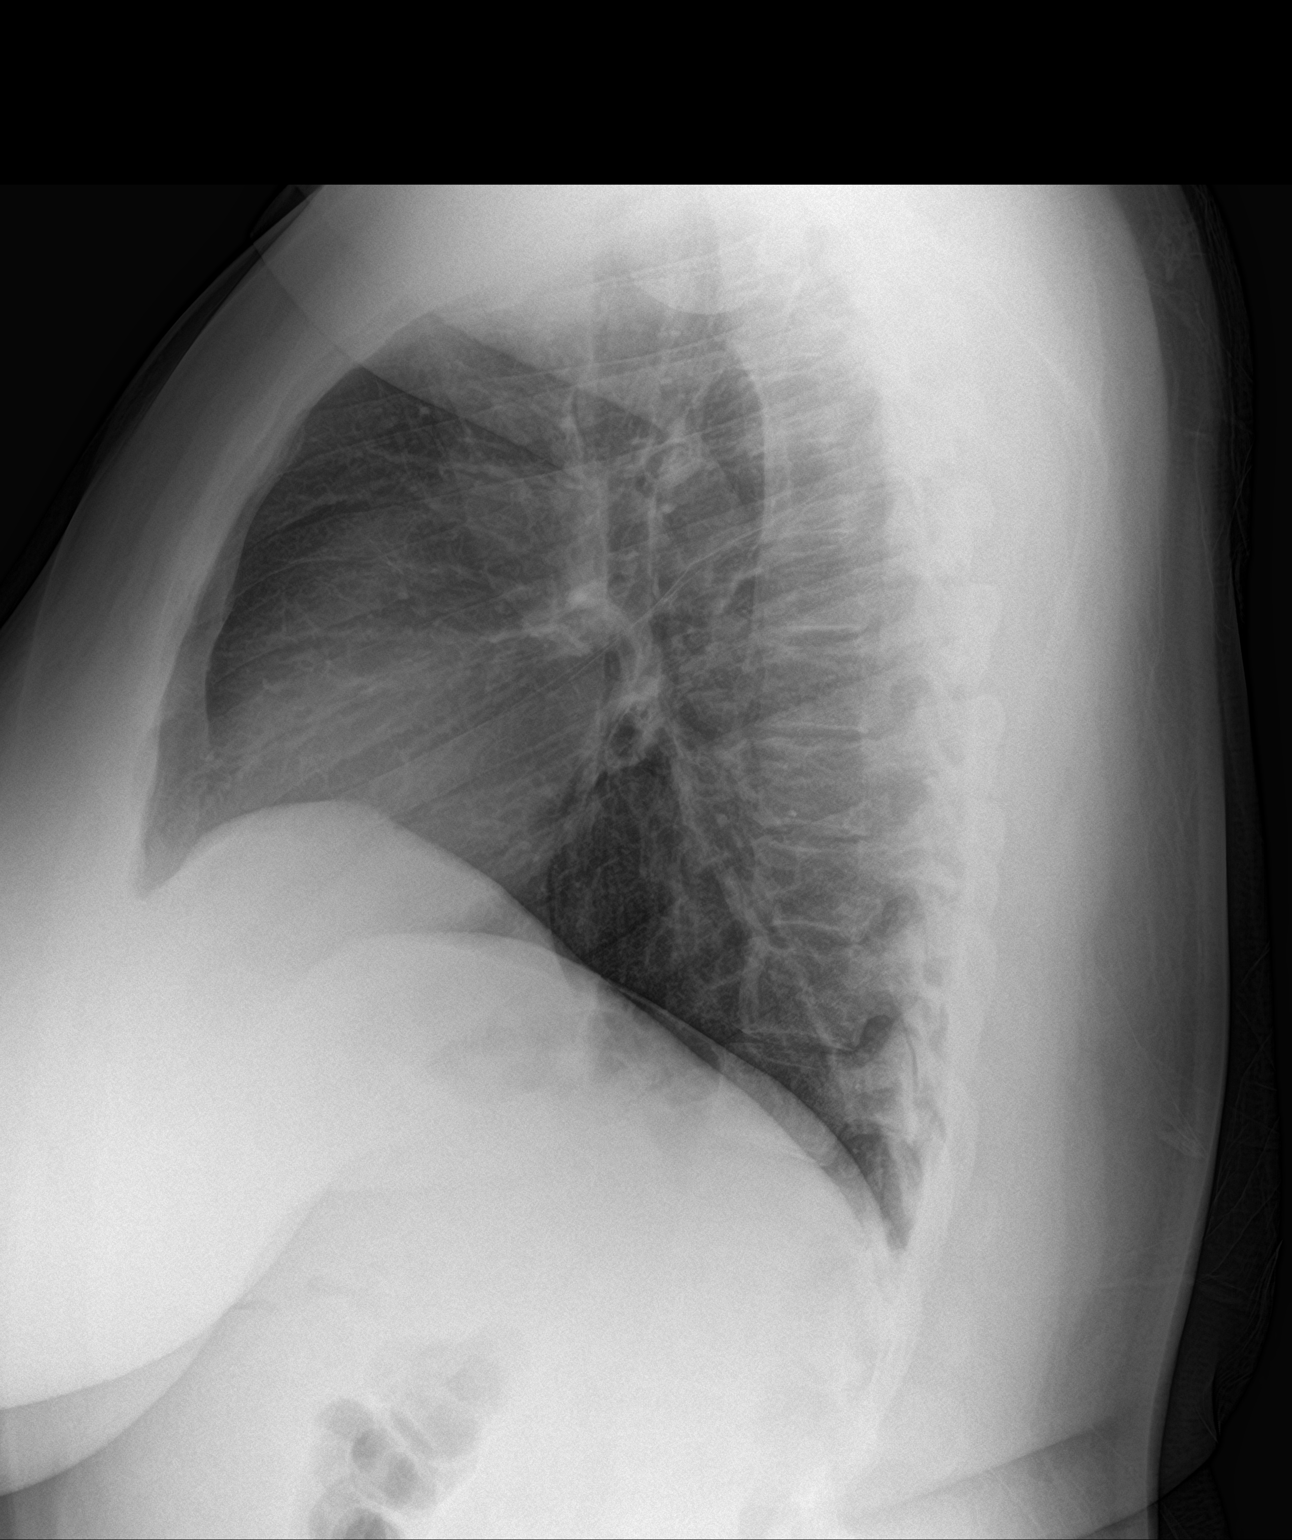

[2 of 2 positions shown; findings below may reference images not displayed]

FINDINGS: The heart size and mediastinal contours are within normal limits.
Both lungs are clear. The visualized skeletal structures are
unremarkable.
IMPRESSION: Normal study.

## 2022-02-07 ENCOUNTER — Telehealth: Payer: Medicaid Other | Admitting: Physician Assistant

## 2022-02-07 NOTE — Progress Notes (Unsigned)
   Complete physical exam  Patient: Danielle Zimmerman   DOB: 09/22/2000   31 y.o. Female  MRN: [id number]  Subjective:    No chief complaint on file.   Danielle Zimmerman is a 31 y.o. female who presents today for a complete physical exam. She reports consuming a {diet types:17450} diet. {types:19826} She generally feels {DESC; WELL/FAIRLY WELL/POORLY:18703}. She reports sleeping {DESC; WELL/FAIRLY WELL/POORLY:18703}. She {does/does not:200015} have additional problems to discuss today.    Most recent fall risk assessment:    05/30/2022   10:42 AM  Fall Risk   Falls in the past year? 0  Number falls in past yr: 0  Injury with Fall? 0  Risk for fall due to : No Fall Risks  Follow up Falls evaluation completed     Most recent depression screenings:    05/30/2022   10:42 AM 04/20/2021   10:46 AM  PHQ 2/9 Scores  PHQ - 2 Score 0 0  PHQ- 9 Score 5     {VISON DENTAL STD PSA (Optional):27386}  {History (Optional):23778}  Patient Care Team: Jessup, Joy, NP as PCP - General (Nurse Practitioner)   Outpatient Medications Prior to Visit  Medication Sig   fluticasone (FLONASE) 50 MCG/ACT nasal spray Place 2 sprays into both nostrils in the morning and at bedtime. After 7 days, reduce to once daily.   norgestimate-ethinyl estradiol (SPRINTEC 28) 0.25-35 MG-MCG tablet Take 1 tablet by mouth daily.   Nystatin POWD Apply liberally to affected area 2 times per day   spironolactone (ALDACTONE) 100 MG tablet Take 1 tablet (100 mg total) by mouth daily.   No facility-administered medications prior to visit.    ROS        Objective:     There were no vitals taken for this visit. {Vitals History (Optional):23777}  Physical Exam   No results found for any visits on 07/05/22. {Show previous labs (optional):23779}    Assessment & Plan:    Routine Health Maintenance and Physical Exam  Immunization History  Administered Date(s) Administered   DTaP 12/06/1999, 02/01/2000,  04/11/2000, 12/26/2000, 07/11/2004   Hepatitis A 05/07/2008, 05/13/2009   Hepatitis B 09/23/1999, 10/31/1999, 04/11/2000   HiB (PRP-OMP) 12/06/1999, 02/01/2000, 04/11/2000, 12/26/2000   IPV 12/06/1999, 02/01/2000, 09/30/2000, 07/11/2004   Influenza,inj,Quad PF,6+ Mos 08/13/2014   Influenza-Unspecified 11/12/2012   MMR 09/30/2001, 07/11/2004   Meningococcal Polysaccharide 05/12/2012   Pneumococcal Conjugate-13 12/26/2000   Pneumococcal-Unspecified 04/11/2000, 06/25/2000   Tdap 05/12/2012   Varicella 09/30/2000, 05/07/2008    Health Maintenance  Topic Date Due   HIV Screening  Never done   Hepatitis C Screening  Never done   INFLUENZA VACCINE  07/03/2022   PAP-Cervical Cytology Screening  07/05/2022 (Originally 09/21/2020)   PAP SMEAR-Modifier  07/05/2022 (Originally 09/21/2020)   TETANUS/TDAP  07/05/2022 (Originally 05/12/2022)   HPV VACCINES  Discontinued   COVID-19 Vaccine  Discontinued    Discussed health benefits of physical activity, and encouraged her to engage in regular exercise appropriate for her age and condition.  Problem List Items Addressed This Visit   None Visit Diagnoses     Annual physical exam    -  Primary   Cervical cancer screening       Need for Tdap vaccination          No follow-ups on file.     Joy Jessup, NP   

## 2022-11-13 ENCOUNTER — Other Ambulatory Visit: Payer: Self-pay

## 2022-11-13 ENCOUNTER — Encounter (HOSPITAL_COMMUNITY): Payer: Self-pay | Admitting: Emergency Medicine

## 2022-11-13 ENCOUNTER — Emergency Department (HOSPITAL_COMMUNITY): Payer: Medicaid Other

## 2022-11-13 ENCOUNTER — Emergency Department (HOSPITAL_COMMUNITY)
Admission: EM | Admit: 2022-11-13 | Discharge: 2022-11-13 | Discharge status: Against Medical Advice | Payer: Medicaid Other | Attending: Emergency Medicine | Admitting: Emergency Medicine

## 2022-11-13 DIAGNOSIS — Z1152 Encounter for screening for COVID-19: Secondary | ICD-10-CM | POA: Diagnosis not present

## 2022-11-13 DIAGNOSIS — Z5321 Procedure and treatment not carried out due to patient leaving prior to being seen by health care provider: Secondary | ICD-10-CM | POA: Diagnosis not present

## 2022-11-13 DIAGNOSIS — H9203 Otalgia, bilateral: Secondary | ICD-10-CM | POA: Insufficient documentation

## 2022-11-13 LAB — RESP PANEL BY RT-PCR (RSV, FLU A&B, COVID)  RVPGX2
Influenza A by PCR: NEGATIVE
Influenza B by PCR: NEGATIVE
Resp Syncytial Virus by PCR: NEGATIVE
SARS Coronavirus 2 by RT PCR: NEGATIVE

## 2022-11-13 MED ORDER — ONDANSETRON 8 MG PO TBDP
8.0000 mg | ORAL_TABLET | Freq: Once | ORAL | Status: AC
  Administered 2022-11-13: 8 mg via ORAL
  Filled 2022-11-13: qty 1

## 2022-11-13 NOTE — ED Triage Notes (Signed)
  Patient comes in with cough, otalgia, and SOB that started 3 days ago.  Patient states her 31 yo tested positive earlier this week with RSV.  Patient has been taking tylenol/ibuprofen, with last dose of tylenol at 0800 this morning, 1000 mg.  No fevers at home.  Pain 8/10, bilateral earache.

## 2022-11-13 NOTE — ED Notes (Signed)
Pt left without signing AMA

## 2022-12-06 ENCOUNTER — Ambulatory Visit
Admission: EM | Admit: 2022-12-06 | Discharge: 2022-12-06 | Disposition: A | Discharge status: Home / Self Care | Payer: Medicaid Other

## 2022-12-07 ENCOUNTER — Ambulatory Visit: Payer: Self-pay

## 2023-03-04 ENCOUNTER — Ambulatory Visit
Admission: EM | Admit: 2023-03-04 | Discharge: 2023-03-04 | Disposition: A | Discharge status: Home / Self Care | Payer: Medicaid Other

## 2023-03-04 DIAGNOSIS — L509 Urticaria, unspecified: Secondary | ICD-10-CM | POA: Diagnosis not present

## 2023-03-04 MED ORDER — PREDNISONE 10 MG PO TABS: ORAL_TABLET | ORAL | 0 refills | Status: DC

## 2023-03-04 MED ORDER — CETIRIZINE HCL 10 MG PO TABS: 10.0000 mg | ORAL_TABLET | Freq: Two times a day (BID) | ORAL | 0 refills | Status: AC

## 2023-03-04 NOTE — ED Provider Notes (Signed)
[facility]    CSN: [id number] Arrival date & time: 03/04/23  [id number]      History   Chief Complaint Chief Complaint  Patient presents with   Rash    HPI Danielle Zimmerman is a 32 y.o. female.   Patient presenting today with 1 month history of intermittent hives occurring at least once throughout the day all over her body.  She states Benadryl gives short-term relief and has been taking this about twice a day.  Denies any new medications, supplements, foods, environmental exposures and is wondering if they are stress related.  No recent illnesses.  Denies throat itching or swelling, fevers, chills, chest tightness, wheezing, shortness of breath, chest pain, nausea, vomiting.  Does have a history of seasonal allergies and asthma but has never had this issue in the past.    Past Medical History:  Diagnosis Date   Asthma    Chronic back pain    Diabetes mellitus without complication [id number]   GERD (gastroesophageal reflux disease)    Hypertension    Kidney stone    Nausea and vomiting    recurrent   Recurrent abdominal pain    Seasonal allergies     Patient Active Problem List   Diagnosis Date Noted   Hypertension 03/24/2020   Type 2 diabetes mellitus with other specified complication 0000000   Adult BMI 31.0-31.9 kg/sq m 03/24/2020   PTSD (post-traumatic stress disorder) 03/24/2020   Anxiety and depression 03/24/2020    Past Surgical History:  Procedure Laterality Date   CESAREAN SECTION     CLOSED REDUCTION NASAL FRACTURE N/A 04/21/2021   Procedure: CLOSED REDUCTION NASAL FRACTURE;  Surgeon: Leta Baptist, MD;  Location: [facility];  Service: ENT;  Laterality: N/A;   TONSILLECTOMY AND ADENOIDECTOMY      OB History     Gravida  1   Para      Term      Preterm      AB      Living         SAB      IAB      Ectopic      Multiple      Live Births               Home Medications    Prior to Admission medications    Medication Sig Start Date End Date Taking? Authorizing Provider  cetirizine (ZYRTEC ALLERGY) 10 MG tablet Take 1 tablet (10 mg total) by mouth 2 (two) times daily. 03/04/23  Yes Volney American, PA-C  diphenhydrAMINE (BENADRYL) 50 MG capsule Take 50 mg by mouth every 6 (six) hours as needed.   Yes [provider]  predniSONE (DELTASONE) 10 MG tablet Take 6 tabs daily x 2 days, 5 tabs daily x 2 days, 4 tabs daily x 2 days, etc 03/04/23  Yes Volney American, PA-C  atorvastatin (LIPITOR) 20 MG tablet Take 1 tablet (20 mg total) by mouth daily. 03/24/20   Annie Main, FNP  fluticasone (FLONASE) 50 MCG/ACT nasal spray Place 1 spray into both nostrils daily for 7 days. 06/09/20 06/16/20  Rancour, Annie Main, MD  ibuprofen (ADVIL) 600 MG tablet Take 1 tablet (600 mg total) by mouth every 6 (six) hours as needed. 06/09/20   Rancour, Annie Main, MD  Insulin Pen Needle 32G X 4 MM MISC Use as directed with insulin daily 05/10/20   Annie Main, FNP  LANTUS SOLOSTAR 100 UNIT/ML Solostar Pen Inject 40  Units into the skin at bedtime. Patient taking differently: Inject 60 Units into the skin at bedtime. 03/24/20   Annie Main, FNP  losartan-hydrochlorothiazide (HYZAAR) 50-12.5 MG tablet Take 1 tablet by mouth daily. 03/24/20   Annie Main, FNP  metFORMIN (GLUCOPHAGE) 500 MG tablet Take 1 tablet (500 mg total) by mouth 2 (two) times daily with a meal. Patient taking differently: Take 500 mg by mouth 3 (three) times daily before meals. 03/24/20 10/20/20  Annie Main, FNP  ondansetron (ZOFRAN ODT) 4 MG disintegrating tablet 4mg  ODT q4 hours prn nausea/vomit 02/08/21   Milton Ferguson, MD  traMADol (ULTRAM) 50 MG tablet Take 1 tablet (50 mg total) by mouth every 6 (six) hours as needed. 04/11/21   Kem Parkinson, PA-C    Family History History reviewed. No pertinent family history.  Social History Social History   Tobacco Use   Smoking status: Some Days    Packs/day: 0.50    Years:  10.00    Additional pack years: 0.00    Total pack years: 5.00    Types: Cigarettes    Last attempt to quit: 08/11/2020    Years since quitting: 2.5   Smokeless tobacco: Never   Tobacco comments:    2-3 cigarettes daily  Vaping Use   Vaping Use: Never used  Substance Use Topics   Alcohol use: No   Drug use: Yes    Types: Marijuana     Allergies   Hydrocodone   Review of Systems Review of Systems PER HPI  Physical Exam Triage Vital Signs ED Triage Vitals  Enc Vitals Group     BP 03/04/23 0927 119/80     Pulse Rate 03/04/23 0927 98     Resp 03/04/23 0927 16     Temp 03/04/23 0927 99.1 F (37.3 C)     Temp Source 03/04/23 0927 Oral     SpO2 03/04/23 0927 97 %     Weight --      Height --      Head Circumference --      Peak Flow --      Pain Score 03/04/23 0925 0     Pain Loc --      Pain Edu? --      Excl. in [place]? --    No data found.  Updated Vital Signs BP 119/80 (BP Location: Right Arm)   Pulse 98   Temp 99.1 F (37.3 C) (Oral)   Resp 16   LMP  (Within Weeks) Comment: 3-4 weeks  SpO2 97%   Visual Acuity Right Eye Distance:   Left Eye Distance:   Bilateral Distance:    Right Eye Near:   Left Eye Near:    Bilateral Near:     Physical Exam Vitals and nursing note reviewed.  Constitutional:      Appearance: Normal appearance. She is not ill-appearing.  HENT:     Head: Atraumatic.     Nose: Nose normal.     Mouth/Throat:     Mouth: Mucous membranes are moist.     Pharynx: No oropharyngeal exudate or posterior oropharyngeal erythema.  Eyes:     Extraocular Movements: Extraocular movements intact.     Conjunctiva/sclera: Conjunctivae normal.  Cardiovascular:     Rate and Rhythm: Normal rate and regular rhythm.     Heart sounds: Normal heart sounds.  Pulmonary:     Effort: Pulmonary effort is normal.     Breath sounds: Normal breath sounds. No wheezing or rales.  Musculoskeletal:        General: Normal range of motion.     Cervical back:  Normal range of motion and neck supple.  Skin:    General: Skin is warm and dry.     Findings: Rash present.     Comments: Sporadic areas of urticarial rash noted to extremities  Neurological:     Mental Status: She is alert and oriented to person, place, and time.  Psychiatric:        Mood and Affect: Mood normal.        Thought Content: Thought content normal.        Judgment: Judgment normal.    UC Treatments / Results  Labs (all labs ordered are listed, but only abnormal results are displayed) Labs Reviewed - No data to display  EKG   Radiology No results found.  Procedures Procedures (including critical care time)  Medications Ordered in UC Medications - No data to display  Initial Impression / Assessment and Plan / UC Course  I have reviewed the triage vital signs and the nursing notes.  Pertinent labs & imaging results that were available during my care of the patient were reviewed by me and considered in my medical decision making (see chart for details).     Vitals and exam overall reassuring today, consistent with hives of unknown origin.  Discussed need for allergy testing given duration of symptoms.  Will treat with an extended prednisone taper, Zyrtec twice daily for good coverage of histamine response and avoidance of any scented products, new foods or supplements until able to get testing.  Return immediately for worsening symptoms.  Final Clinical Impressions(s) / UC Diagnoses   Final diagnoses:  Hives   Discharge Instructions   None    ED Prescriptions     Medication Sig Dispense Auth. Provider   cetirizine (ZYRTEC ALLERGY) 10 MG tablet Take 1 tablet (10 mg total) by mouth 2 (two) times daily. 60 tablet Volney American, [place]   predniSONE (DELTASONE) 10 MG tablet Take 6 tabs daily x 2 days, 5 tabs daily x 2 days, 4 tabs daily x 2 days, etc 42 tablet Volney American, [place]      PDMP not reviewed this encounter.   Volney American, [place] 03/04/23 1024

## 2023-03-04 NOTE — ED Triage Notes (Signed)
Pt reports itching rash x 1 month all over the body. Benadryl gives some relief. Pt think rash may be relate to anxiety.

## 2023-06-15 ENCOUNTER — Other Ambulatory Visit: Payer: Self-pay

## 2023-06-15 ENCOUNTER — Emergency Department (HOSPITAL_COMMUNITY)
Admission: EM | Admit: 2023-06-15 | Discharge: 2023-06-15 | Disposition: A | Discharge status: Home / Self Care | Payer: Medicaid Other | Source: Home / Self Care | Attending: Emergency Medicine | Admitting: Emergency Medicine

## 2023-06-15 ENCOUNTER — Encounter (HOSPITAL_COMMUNITY): Payer: Self-pay

## 2023-06-15 DIAGNOSIS — R1110 Vomiting, unspecified: Secondary | ICD-10-CM | POA: Insufficient documentation

## 2023-06-15 DIAGNOSIS — R42 Dizziness and giddiness: Secondary | ICD-10-CM | POA: Diagnosis present

## 2023-06-15 DIAGNOSIS — E119 Type 2 diabetes mellitus without complications: Secondary | ICD-10-CM | POA: Insufficient documentation

## 2023-06-15 DIAGNOSIS — N3001 Acute cystitis with hematuria: Secondary | ICD-10-CM | POA: Insufficient documentation

## 2023-06-15 DIAGNOSIS — Z7984 Long term (current) use of oral hypoglycemic drugs: Secondary | ICD-10-CM | POA: Insufficient documentation

## 2023-06-15 DIAGNOSIS — Z794 Long term (current) use of insulin: Secondary | ICD-10-CM | POA: Diagnosis not present

## 2023-06-15 DIAGNOSIS — Z760 Encounter for issue of repeat prescription: Secondary | ICD-10-CM | POA: Insufficient documentation

## 2023-06-15 DIAGNOSIS — E860 Dehydration: Secondary | ICD-10-CM | POA: Diagnosis not present

## 2023-06-15 LAB — URINALYSIS, ROUTINE W REFLEX MICROSCOPIC
Bilirubin Urine: NEGATIVE
Glucose, UA: >=500 mg/dL — AB
Ketones, ur: NEGATIVE mg/dL
Leukocytes,Ua: NEGATIVE
Nitrite: POSITIVE — AB
Protein, ur: 30 mg/dL — AB
RBC / HPF: >50 RBC/hpf (ref 0–5)
Specific Gravity, Urine: 1.02 (ref 1.005–1.030)
pH: 5 (ref 5.0–8.0)

## 2023-06-15 LAB — HEPATIC FUNCTION PANEL
ALT: 43 U/L (ref 0–44)
AST: 37 U/L (ref 15–41)
Albumin: 3.5 g/dL (ref 3.5–5.0)
Alkaline Phosphatase: 83 U/L (ref 38–126)
Bilirubin, Direct: 0.1 mg/dL (ref 0.0–0.2)
Indirect Bilirubin: 0.5 mg/dL (ref 0.3–0.9)
Total Bilirubin: 0.6 mg/dL (ref 0.3–1.2)
Total Protein: 6.8 g/dL (ref 6.5–8.1)

## 2023-06-15 LAB — CBC
HCT: 36.2 % (ref 36.0–46.0)
Hemoglobin: 11.8 g/dL — ABNORMAL LOW (ref 12.0–15.0)
MCH: 28.6 pg (ref 26.0–34.0)
MCHC: 32.6 g/dL (ref 30.0–36.0)
MCV: 87.7 fL (ref 80.0–100.0)
Platelets: 391 10*3/uL (ref 150–400)
RBC: 4.13 MIL/uL (ref 3.87–5.11)
RDW: 13.4 % (ref 11.5–15.5)
WBC: 6.9 10*3/uL (ref 4.0–10.5)
nRBC: 0 % (ref 0.0–0.2)

## 2023-06-15 LAB — BASIC METABOLIC PANEL
Anion gap: 10 (ref 5–15)
BUN: 11 mg/dL (ref 6–20)
CO2: 21 mmol/L — ABNORMAL LOW (ref 22–32)
Calcium: 8.7 mg/dL — ABNORMAL LOW (ref 8.9–10.3)
Chloride: 103 mmol/L (ref 98–111)
Creatinine, Ser: 0.81 mg/dL (ref 0.44–1.00)
GFR, Estimated: >60 mL/min (ref >60)
Glucose, Bld: 274 mg/dL — ABNORMAL HIGH (ref 70–99)
Potassium: 3.8 mmol/L (ref 3.5–5.1)
Sodium: 134 mmol/L — ABNORMAL LOW (ref 135–145)

## 2023-06-15 LAB — CBG MONITORING, ED: Glucose-Capillary: 271 mg/dL — ABNORMAL HIGH (ref 70–99)

## 2023-06-15 LAB — LIPASE, BLOOD: Lipase: 29 U/L (ref 11–51)

## 2023-06-15 MED ORDER — SODIUM CHLORIDE 0.9 % IV BOLUS
1000.0000 mL | Freq: Once | INTRAVENOUS | Status: AC
  Administered 2023-06-15: 1000 mL via INTRAVENOUS

## 2023-06-15 MED ORDER — METFORMIN HCL 500 MG PO TABS: 500.0000 mg | ORAL_TABLET | Freq: Two times a day (BID) | ORAL | 1 refills | Status: AC

## 2023-06-15 MED ORDER — SODIUM CHLORIDE 0.9 % IV SOLN
2.0000 g | Freq: Once | INTRAVENOUS | Status: AC
  Administered 2023-06-15: 2 g via INTRAVENOUS
  Filled 2023-06-15: qty 20

## 2023-06-15 MED ORDER — PANTOPRAZOLE SODIUM 40 MG IV SOLR
40.0000 mg | Freq: Once | INTRAVENOUS | Status: AC
  Administered 2023-06-15: 40 mg via INTRAVENOUS
  Filled 2023-06-15: qty 10

## 2023-06-15 MED ORDER — KETOROLAC TROMETHAMINE 30 MG/ML IJ SOLN
30.0000 mg | Freq: Once | INTRAMUSCULAR | Status: AC
  Administered 2023-06-15: 30 mg via INTRAVENOUS
  Filled 2023-06-15: qty 1

## 2023-06-15 MED ORDER — CEPHALEXIN 500 MG PO CAPS: 500.0000 mg | ORAL_CAPSULE | Freq: Four times a day (QID) | ORAL | 0 refills | Status: DC

## 2023-06-15 NOTE — Discharge Instructions (Signed)
Follow-up with your family doctor in 1 to 2 weeks to recheck your urine and your sugar level

## 2023-06-15 NOTE — ED Notes (Signed)
Pt aware we need Urine Sample 

## 2023-06-15 NOTE — ED Triage Notes (Signed)
Per patient  No insulin Out for a year Has not taken BG since Lack due to no PCP Dizziness Fingers going numb X2 days Vomiting Ongoing for months Requesting medication refill Insulin Wants IVF

## 2023-06-15 NOTE — ED Provider Notes (Signed)
[place] EMERGENCY DEPARTMENT AT [facility] Provider Note   CSN: [id number] Arrival date & time: 06/15/23  1104     History  Chief Complaint  Patient presents with   Dizziness   Emesis   Medication Refill    insulin    Danielle Zimmerman is a 32 y.o. female.  Patient complains of being thirsty and she states she has not been taking her diabetes medicine  The history is provided by the patient and medical records. No language interpreter was used.  Dizziness Quality:  Lightheadedness Severity:  Mild Onset quality:  Gradual Timing:  Intermittent Progression:  Waxing and waning Chronicity:  Recurrent Context: not when bending over   Relieved by:  Nothing Worsened by:  Nothing Ineffective treatments:  None tried Associated symptoms: vomiting   Associated symptoms: no chest pain, no diarrhea and no headaches   Emesis Associated symptoms: no abdominal pain, no cough, no diarrhea and no headaches   Medication Refill      Home Medications Prior to Admission medications   Medication Sig Start Date End Date Taking? Authorizing Provider  cephALEXin (KEFLEX) 500 MG capsule Take 1 capsule (500 mg total) by mouth 4 (four) times daily. 06/15/23  Yes Bethann Berkshire, MD  metFORMIN (GLUCOPHAGE) 500 MG tablet Take 1 tablet (500 mg total) by mouth 2 (two) times daily with a meal. 06/15/23  Yes Bethann Berkshire, MD  atorvastatin (LIPITOR) 20 MG tablet Take 1 tablet (20 mg total) by mouth daily. 03/24/20   Elmore Guise, FNP  cetirizine (ZYRTEC ALLERGY) 10 MG tablet Take 1 tablet (10 mg total) by mouth 2 (two) times daily. 03/04/23   Particia Nearing, PA-C  diphenhydrAMINE (BENADRYL) 50 MG capsule Take 50 mg by mouth every 6 (six) hours as needed.    [provider]  fluticasone (FLONASE) 50 MCG/ACT nasal spray Place 1 spray into both nostrils daily for 7 days. 06/09/20 06/16/20  Rancour, Jeannett Senior, MD  ibuprofen (ADVIL) 600 MG tablet Take 1 tablet (600 mg total) by  mouth every 6 (six) hours as needed. 06/09/20   Rancour, Jeannett Senior, MD  Insulin Pen Needle 32G X 4 MM MISC Use as directed with insulin daily 05/10/20   Lawson Fiscal A, FNP  LANTUS SOLOSTAR 100 UNIT/ML Solostar Pen Inject 40 Units into the skin at bedtime. Patient taking differently: Inject 60 Units into the skin at bedtime. 03/24/20   Elmore Guise, FNP  losartan-hydrochlorothiazide (HYZAAR) 50-12.5 MG tablet Take 1 tablet by mouth daily. 03/24/20   Elmore Guise, FNP  ondansetron (ZOFRAN ODT) 4 MG disintegrating tablet 4mg  ODT q4 hours prn nausea/vomit 02/08/21   Bethann Berkshire, MD  predniSONE (DELTASONE) 10 MG tablet Take 6 tabs daily x 2 days, 5 tabs daily x 2 days, 4 tabs daily x 2 days, etc 03/04/23   Particia Nearing, PA-C  traMADol (ULTRAM) 50 MG tablet Take 1 tablet (50 mg total) by mouth every 6 (six) hours as needed. 04/11/21   Triplett, Tammy, PA-C      Allergies    Hydrocodone    Review of Systems   Review of Systems  Constitutional:  Negative for appetite change and fatigue.  HENT:  Negative for congestion, ear discharge and sinus pressure.   Eyes:  Negative for discharge.  Respiratory:  Negative for cough.   Cardiovascular:  Negative for chest pain.  Gastrointestinal:  Positive for vomiting. Negative for abdominal pain and diarrhea.  Genitourinary:  Negative for frequency and hematuria.  Musculoskeletal:  Negative for back pain.  Skin:  Negative for rash.  Neurological:  Positive for dizziness. Negative for seizures and headaches.  Psychiatric/Behavioral:  Negative for hallucinations.     Physical Exam Updated Vital Signs BP (!) 131/93 (BP Location: Right Arm)   Pulse 90   Temp 98 F (36.7 C) (Oral)   Resp 20   Ht 5\' 7"  (1.702 m)   Wt 132.5 kg   SpO2 99%   BMI 45.75 kg/m  Physical Exam Vitals and nursing note reviewed.  Constitutional:      Appearance: She is well-developed.  HENT:     Head: Normocephalic.     Nose: Nose normal.  Eyes:     General: No  scleral icterus.    Conjunctiva/sclera: Conjunctivae normal.  Neck:     Thyroid: No thyromegaly.  Cardiovascular:     Rate and Rhythm: Normal rate and regular rhythm.     Heart sounds: No murmur heard.    No friction rub. No gallop.  Pulmonary:     Breath sounds: No stridor. No wheezing or rales.  Chest:     Chest wall: No tenderness.  Abdominal:     General: There is no distension.     Tenderness: There is no abdominal tenderness. There is no rebound.  Musculoskeletal:        General: Normal range of motion.     Cervical back: Neck supple.  Lymphadenopathy:     Cervical: No cervical adenopathy.  Skin:    Findings: No erythema or rash.  Neurological:     Mental Status: She is alert and oriented to person, place, and time.     Motor: No abnormal muscle tone.     Coordination: Coordination normal.  Psychiatric:        Behavior: Behavior normal.     ED Results / Procedures / Treatments   Labs (all labs ordered are listed, but only abnormal results are displayed) Labs Reviewed  BASIC METABOLIC PANEL - Abnormal; Notable for the following components:      Result Value   Sodium 134 (*)    CO2 21 (*)    Glucose, Bld 274 (*)    Calcium 8.7 (*)    All other components within normal limits  CBC - Abnormal; Notable for the following components:   Hemoglobin 11.8 (*)    All other components within normal limits  URINALYSIS, ROUTINE W REFLEX MICROSCOPIC - Abnormal; Notable for the following components:   APPearance HAZY (*)    Glucose, UA >=500 (*)    Hgb urine dipstick LARGE (*)    Protein, ur 30 (*)    Nitrite POSITIVE (*)    Bacteria, UA MANY (*)    All other components within normal limits  CBG MONITORING, ED - Abnormal; Notable for the following components:   Glucose-Capillary 271 (*)    All other components within normal limits  URINE CULTURE  LIPASE, BLOOD  HEPATIC FUNCTION PANEL    EKG None  Radiology No results found.  Procedures Procedures     Medications Ordered in ED Medications  cefTRIAXone (ROCEPHIN) 2 g in sodium chloride 0.9 % 100 mL IVPB (2 g Intravenous New Bag/Given 06/15/23 1432)  sodium chloride 0.9 % bolus 1,000 mL (0 mLs Intravenous Stopped 06/15/23 1409)  pantoprazole (PROTONIX) injection 40 mg (40 mg Intravenous Given 06/15/23 1214)  ketorolac (TORADOL) 30 MG/ML injection 30 mg (30 mg Intravenous Given 06/15/23 1215)    ED Course/ Medical Decision Making/ A&P  Medical Decision Making Amount and/or Complexity of Data Reviewed Labs: ordered.  Risk Prescription drug management.  This patient presents to the ED for concern of thirsty and dizzy, this involves an extensive number of treatment options, and is a complaint that carries with it a high risk of complications and morbidity.  The differential diagnosis includes well-controlled diabetes, bowel syndrome   Co morbidities that complicate the patient evaluation  Diabetes   Additional history obtained:  Additional history obtained from patient External records from outside source obtained and reviewed including hospital records   Lab Tests:  I Ordered, and personally interpreted labs.  The pertinent results include: Glucose 271   Imaging Studies ordered: No imaging  Cardiac Monitoring: / EKG:  The patient was maintained on a cardiac monitor.  I personally viewed and interpreted the cardiac monitored which showed an underlying rhythm of: Normal sinus rhythm   Consultations Obtained:  No consultant  Problem List / ED Course / Critical interventions / Medication management  Diabetes I ordered medication including normal saline and Rocephin Reevaluation of the patient after these medicines showed that the patient improved I have reviewed the patients home medicines and have made adjustments as needed   Social Determinants of Health:  None   Test / Admission - Considered:  None  Patient with urinary  tract infection and poorly controlled glucose.  She will will be placed on metformin and Keflex and follow-up with her doctor        Final Clinical Impression(s) / ED Diagnoses Final diagnoses:  Dehydration  Acute cystitis with hematuria    Rx / DC Orders ED Discharge Orders          Ordered    cephALEXin (KEFLEX) 500 MG capsule  4 times daily        06/15/23 1454    metFORMIN (GLUCOPHAGE) 500 MG tablet  2 times daily with meals        06/15/23 1454              Bethann Berkshire, MD 06/17/23 1655

## 2023-06-17 LAB — URINE CULTURE: Culture: >=100000 — AB

## 2023-06-18 ENCOUNTER — Telehealth (HOSPITAL_BASED_OUTPATIENT_CLINIC_OR_DEPARTMENT_OTHER): Payer: Self-pay | Admitting: *Deleted

## 2023-06-18 NOTE — Telephone Encounter (Signed)
Post ED Visit - Positive Culture Follow-up  Culture report reviewed by antimicrobial stewardship pharmacist: [facility] [x]  Daylene Posey, Pharm.D. []  Celedonio Miyamoto, Pharm.D., BCPS AQ-ID []  Garvin Fila, Pharm.D., BCPS []  Georgina Pillion, Pharm.D., BCPS []  Weinert, [place].D., BCPS, AAHIVP []  Estella Husk, Pharm.D., BCPS, AAHIVP []  Lysle Pearl, PharmD, BCPS []  Phillips Climes, PharmD, BCPS []  Agapito Games, PharmD, BCPS []  Verlan Friends, PharmD []  Mervyn Gay, PharmD, BCPS []  Vinnie Level, PharmD  [facility] []  Len Childs, PharmD []  Greer Pickerel, PharmD []  Adalberto Cole, PharmD []  Perlie Gold, Rph []  Lonell Face) Jean Rosenthal, PharmD []  Earl Many, PharmD []  Junita Push, PharmD []  Dorna Leitz, PharmD []  Terrilee Files, PharmD []  Lynann Beaver, PharmD []  Keturah Barre, PharmD []  Loralee Pacas, PharmD []  Bernadene Person, PharmD   Positive urine culture Treated with Cephalexin, organism sensitive to the same and no further patient follow-up is required at this time.  Virl A[facility] 06/18/2023, 9:01 AM

## 2023-08-18 ENCOUNTER — Other Ambulatory Visit: Payer: Self-pay

## 2023-08-18 ENCOUNTER — Emergency Department (HOSPITAL_COMMUNITY)
Admission: EM | Admit: 2023-08-18 | Discharge: 2023-08-18 | Disposition: A | Discharge status: Home / Self Care | Payer: Medicaid Other | Attending: Emergency Medicine | Admitting: Emergency Medicine

## 2023-08-18 ENCOUNTER — Emergency Department (HOSPITAL_COMMUNITY): Payer: Medicaid Other

## 2023-08-18 DIAGNOSIS — J45909 Unspecified asthma, uncomplicated: Secondary | ICD-10-CM | POA: Diagnosis not present

## 2023-08-18 DIAGNOSIS — Z79899 Other long term (current) drug therapy: Secondary | ICD-10-CM | POA: Diagnosis not present

## 2023-08-18 DIAGNOSIS — E119 Type 2 diabetes mellitus without complications: Secondary | ICD-10-CM | POA: Insufficient documentation

## 2023-08-18 DIAGNOSIS — R739 Hyperglycemia, unspecified: Secondary | ICD-10-CM | POA: Insufficient documentation

## 2023-08-18 DIAGNOSIS — Z794 Long term (current) use of insulin: Secondary | ICD-10-CM | POA: Diagnosis not present

## 2023-08-18 DIAGNOSIS — Z1152 Encounter for screening for COVID-19: Secondary | ICD-10-CM | POA: Insufficient documentation

## 2023-08-18 DIAGNOSIS — Z7951 Long term (current) use of inhaled steroids: Secondary | ICD-10-CM | POA: Diagnosis not present

## 2023-08-18 DIAGNOSIS — R519 Headache, unspecified: Secondary | ICD-10-CM | POA: Diagnosis not present

## 2023-08-18 DIAGNOSIS — R1031 Right lower quadrant pain: Secondary | ICD-10-CM | POA: Diagnosis not present

## 2023-08-18 DIAGNOSIS — R109 Unspecified abdominal pain: Secondary | ICD-10-CM

## 2023-08-18 DIAGNOSIS — R059 Cough, unspecified: Secondary | ICD-10-CM | POA: Diagnosis present

## 2023-08-18 DIAGNOSIS — I10 Essential (primary) hypertension: Secondary | ICD-10-CM | POA: Diagnosis not present

## 2023-08-18 DIAGNOSIS — F17210 Nicotine dependence, cigarettes, uncomplicated: Secondary | ICD-10-CM | POA: Insufficient documentation

## 2023-08-18 DIAGNOSIS — J069 Acute upper respiratory infection, unspecified: Secondary | ICD-10-CM | POA: Diagnosis not present

## 2023-08-18 LAB — URINALYSIS, ROUTINE W REFLEX MICROSCOPIC
Bacteria, UA: NONE SEEN
Bilirubin Urine: NEGATIVE
Glucose, UA: >=500 mg/dL — AB
Hgb urine dipstick: NEGATIVE
Ketones, ur: NEGATIVE mg/dL
Nitrite: NEGATIVE
Protein, ur: NEGATIVE mg/dL
Specific Gravity, Urine: 1.03 (ref 1.005–1.030)
pH: 5 (ref 5.0–8.0)

## 2023-08-18 LAB — CBC WITH DIFFERENTIAL/PLATELET
Abs Immature Granulocytes: 0.09 10*3/uL — ABNORMAL HIGH (ref 0.00–0.07)
Basophils Absolute: 0.1 10*3/uL (ref 0.0–0.1)
Basophils Relative: 1 %
Eosinophils Absolute: 0.2 10*3/uL (ref 0.0–0.5)
Eosinophils Relative: 4 %
HCT: 38.3 % (ref 36.0–46.0)
Hemoglobin: 12.3 g/dL (ref 12.0–15.0)
Immature Granulocytes: 2 %
Lymphocytes Relative: 19 %
Lymphs Abs: 1.1 10*3/uL (ref 0.7–4.0)
MCH: 28.1 pg (ref 26.0–34.0)
MCHC: 32.1 g/dL (ref 30.0–36.0)
MCV: 87.4 fL (ref 80.0–100.0)
Monocytes Absolute: 0.4 10*3/uL (ref 0.1–1.0)
Monocytes Relative: 7 %
Neutro Abs: 4 10*3/uL (ref 1.7–7.7)
Neutrophils Relative %: 67 %
Platelets: 310 10*3/uL (ref 150–400)
RBC: 4.38 MIL/uL (ref 3.87–5.11)
RDW: 13.3 % (ref 11.5–15.5)
WBC: 5.9 10*3/uL (ref 4.0–10.5)
nRBC: 0 % (ref 0.0–0.2)

## 2023-08-18 LAB — COMPREHENSIVE METABOLIC PANEL
ALT: 46 U/L — ABNORMAL HIGH (ref 0–44)
AST: 30 U/L (ref 15–41)
Albumin: 3.5 g/dL (ref 3.5–5.0)
Alkaline Phosphatase: 99 U/L (ref 38–126)
Anion gap: 11 (ref 5–15)
BUN: 11 mg/dL (ref 6–20)
CO2: 22 mmol/L (ref 22–32)
Calcium: 8.8 mg/dL — ABNORMAL LOW (ref 8.9–10.3)
Chloride: 99 mmol/L (ref 98–111)
Creatinine, Ser: 1.1 mg/dL — ABNORMAL HIGH (ref 0.44–1.00)
GFR, Estimated: >60 mL/min (ref >60)
Glucose, Bld: 355 mg/dL — ABNORMAL HIGH (ref 70–99)
Potassium: 3.5 mmol/L (ref 3.5–5.1)
Sodium: 132 mmol/L — ABNORMAL LOW (ref 135–145)
Total Bilirubin: 0.7 mg/dL (ref 0.3–1.2)
Total Protein: 7.1 g/dL (ref 6.5–8.1)

## 2023-08-18 LAB — TROPONIN I (HIGH SENSITIVITY): Troponin I (High Sensitivity): 3 ng/L (ref <18)

## 2023-08-18 LAB — CBG MONITORING, ED: Glucose-Capillary: 414 mg/dL — ABNORMAL HIGH (ref 70–99)

## 2023-08-18 LAB — PREGNANCY, URINE: Preg Test, Ur: NEGATIVE

## 2023-08-18 LAB — SARS CORONAVIRUS 2 BY RT PCR: SARS Coronavirus 2 by RT PCR: NEGATIVE

## 2023-08-18 MED ORDER — KETOROLAC TROMETHAMINE 15 MG/ML IJ SOLN
15.0000 mg | Freq: Once | INTRAMUSCULAR | Status: AC
  Administered 2023-08-18: 15 mg via INTRAVENOUS
  Filled 2023-08-18: qty 1

## 2023-08-18 MED ORDER — PROCHLORPERAZINE MALEATE 5 MG PO TABS: 5.0000 mg | ORAL_TABLET | Freq: Two times a day (BID) | ORAL | 0 refills | Status: AC | PRN

## 2023-08-18 MED ORDER — ACETAMINOPHEN 500 MG PO TABS
1000.0000 mg | ORAL_TABLET | Freq: Once | ORAL | Status: AC
  Administered 2023-08-18: 1000 mg via ORAL
  Filled 2023-08-18: qty 2

## 2023-08-18 MED ORDER — IOHEXOL 300 MG/ML  SOLN
100.0000 mL | Freq: Once | INTRAMUSCULAR | Status: AC | PRN
  Administered 2023-08-18: 100 mL via INTRAVENOUS

## 2023-08-18 MED ORDER — SODIUM CHLORIDE 0.9 % IV BOLUS
1000.0000 mL | Freq: Once | INTRAVENOUS | Status: AC
  Administered 2023-08-18: 1000 mL via INTRAVENOUS

## 2023-08-18 NOTE — Discharge Instructions (Addendum)
It was a pleasure caring for you today in the emergency department.  Please monitor your blood sugar closely at home  Please follow up with PCP   Please return to the emergency department for any worsening or worrisome symptoms.

## 2023-08-18 NOTE — ED Triage Notes (Signed)
Pt complains of cough, body aches, right side pain, increased urination x 3 days. 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 Pt also states is diabetic and has not been checking sugar.

## 2023-08-18 NOTE — ED Provider Notes (Signed)
[place] [facility] DEPARTMENT AT [facility] Provider Note  CSN: [id number] Arrival date & time: 08/18/23 1950  Chief Complaint(s) Headache, Generalized Body Aches, and Cough  HPI Danielle Zimmerman is a 32 y.o. female with past medical history as below, significant for DM, GERD, hypertension, kidney stone, anxiety and depression, obesity who presents to the ED with complaint of bodyaches, right-sided flank pain, headaches, polyuria, polydipsia.  Symptom onset yesterday.  Progressively worsening.  Primary pain is right flank and right lower quadrant.  Sharp and stabbing intermittently.  Polyuria polydipsia.  No hematuria or dysuria.  Nausea without vomiting.  No rashes.  No trauma.  She has bodyaches diffusely, intermittent headache similar to prior.  No numbness or tingling or vision changes.  No head trauma.  Headache was gradual in onset and not maximal in onset.  He has been coughing intermittently, intermittently productive.  No medications prior to arrival  Past Medical History Past Medical History:  Diagnosis Date   Asthma    Chronic back pain    Diabetes mellitus without complication (HCC) 05/2019   GERD (gastroesophageal reflux disease)    Hypertension    Kidney stone    Nausea and vomiting    recurrent   Recurrent abdominal pain    Seasonal allergies    Patient Active Problem List   Diagnosis Date Noted   Hypertension 03/24/2020   Type 2 diabetes mellitus with other specified complication (HCC) 03/24/2020   Adult BMI 31.0-31.9 kg/sq m 03/24/2020   PTSD (post-traumatic stress disorder) 03/24/2020   Anxiety and depression 03/24/2020   Home Medication(s) Prior to Admission medications   Medication Sig Start Date End Date Taking? Authorizing Provider  prochlorperazine (COMPAZINE) 5 MG tablet Take 1 tablet (5 mg total) by mouth 2 (two) times daily as needed for up to 10 doses (headache). 08/18/23  Yes Tanda Rockers A, DO  atorvastatin (LIPITOR) 20 MG tablet Take 1  tablet (20 mg total) by mouth daily. 03/24/20   Elmore Guise, FNP  cephALEXin (KEFLEX) 500 MG capsule Take 1 capsule (500 mg total) by mouth 4 (four) times daily. 06/15/23   Bethann Berkshire, MD  cetirizine (ZYRTEC ALLERGY) 10 MG tablet Take 1 tablet (10 mg total) by mouth 2 (two) times daily. 03/04/23   Particia Nearing, PA-C  diphenhydrAMINE (BENADRYL) 50 MG capsule Take 50 mg by mouth every 6 (six) hours as needed.    [provider]  fluticasone (FLONASE) 50 MCG/ACT nasal spray Place 1 spray into both nostrils daily for 7 days. 06/09/20 06/16/20  Rancour, Jeannett Senior, MD  ibuprofen (ADVIL) 600 MG tablet Take 1 tablet (600 mg total) by mouth every 6 (six) hours as needed. 06/09/20   Rancour, Jeannett Senior, MD  Insulin Pen Needle 32G X 4 MM MISC Use as directed with insulin daily 05/10/20   Lawson Fiscal A, FNP  LANTUS SOLOSTAR 100 UNIT/ML Solostar Pen Inject 40 Units into the skin at bedtime. Patient taking differently: Inject 60 Units into the skin at bedtime. 03/24/20   Elmore Guise, FNP  losartan-hydrochlorothiazide (HYZAAR) 50-12.5 MG tablet Take 1 tablet by mouth daily. 03/24/20   Elmore Guise, FNP  metFORMIN (GLUCOPHAGE) 500 MG tablet Take 1 tablet (500 mg total) by mouth 2 (two) times daily with a meal. 06/15/23   Bethann Berkshire, MD  ondansetron (ZOFRAN ODT) 4 MG disintegrating tablet 4mg  ODT q4 hours prn nausea/vomit 02/08/21   Bethann Berkshire, MD  predniSONE (DELTASONE) 10 MG tablet Take 6 tabs daily x 2 days,  5 tabs daily x 2 days, 4 tabs daily x 2 days, etc 03/04/23   Particia Nearing, PA-C  traMADol (ULTRAM) 50 MG tablet Take 1 tablet (50 mg total) by mouth every 6 (six) hours as needed. 04/11/21   Pauline Aus, PA-C                                                                                                                                    Past Surgical History Past Surgical History:  Procedure Laterality Date   CESAREAN SECTION     CLOSED REDUCTION NASAL FRACTURE N/A  04/21/2021   Procedure: CLOSED REDUCTION NASAL FRACTURE;  Surgeon: Newman Pies, MD;  Location: [facility];  Service: ENT;  Laterality: N/A;   TONSILLECTOMY AND ADENOIDECTOMY     Family History No family history on file.  Social History Social History   Tobacco Use   Smoking status: Every Day    Current packs/day: 0.00    Average packs/day: 0.5 packs/day for 10.0 years (5.0 ttl pk-yrs)    Types: Cigarettes    Start date: 08/11/2010    Last attempt to quit: 08/11/2020    Years since quitting: 3.0   Smokeless tobacco: Never   Tobacco comments:    2-3 cigarettes daily  Vaping Use   Vaping status: Never Used  Substance Use Topics   Alcohol use: No   Drug use: Yes    Types: Marijuana   Allergies Hydrocodone  Review of Systems Review of Systems  Constitutional:  Positive for fatigue. Negative for chills and fever.  HENT:  Positive for congestion.   Respiratory:  Positive for chest tightness and shortness of breath.   Cardiovascular:  Negative for chest pain and palpitations.  Gastrointestinal:  Positive for abdominal pain and nausea. Negative for diarrhea and vomiting.  Endocrine: Positive for polydipsia and polyuria.  Genitourinary:  Positive for frequency and urgency. Negative for dysuria, hematuria, vaginal bleeding and vaginal discharge.  Musculoskeletal:  Positive for arthralgias. Negative for neck pain and neck stiffness.  Skin:  Negative for rash and wound.  Neurological:  Positive for headaches. Negative for syncope and numbness.    Physical Exam Vital Signs  I have reviewed the triage vital signs BP 107/77   Pulse 97   Temp 98.3 F (36.8 C) (Oral)   Resp (!) 22   Ht 5\' 7"  (1.702 m)   Wt 131.5 kg   LMP 08/13/2023 (Approximate)   SpO2 98%   BMI 45.42 kg/m  Physical Exam Vitals and nursing note reviewed.  Constitutional:      General: She is not in acute distress.    Appearance: Normal appearance. She is well-developed. She is obese.  HENT:      Head: Normocephalic and atraumatic.     Right Ear: External ear normal.     Left Ear: External ear normal.     Nose: Nose normal.     Mouth/Throat:  Mouth: Mucous membranes are moist.  Eyes:     General: No scleral icterus.       Right eye: No discharge.        Left eye: No discharge.     Extraocular Movements: Extraocular movements intact.     Pupils: Pupils are equal, round, and reactive to light.  Neck:     Comments: No meningismus  Cardiovascular:     Rate and Rhythm: Normal rate and regular rhythm.     Pulses: Normal pulses.     Heart sounds: Normal heart sounds.  Pulmonary:     Effort: Pulmonary effort is normal. No respiratory distress.     Breath sounds: Normal breath sounds. No stridor.  Abdominal:     General: Abdomen is flat. There is no distension.     Palpations: Abdomen is soft.     Tenderness: There is abdominal tenderness. There is no guarding or rebound.    Musculoskeletal:     Cervical back: No rigidity.     Right lower leg: No edema.     Left lower leg: No edema.  Skin:    General: Skin is warm and dry.     Capillary Refill: Capillary refill takes less than 2 seconds.  Neurological:     Mental Status: She is alert and oriented to person, place, and time. Mental status is at baseline.     GCS: GCS eye subscore is 4. GCS verbal subscore is 5. GCS motor subscore is 6.     Cranial Nerves: Cranial nerves 2-12 are intact. No dysarthria or facial asymmetry.     Sensory: No sensory deficit.     Motor: Motor function is intact. No tremor.     Coordination: Coordination is intact. Coordination normal.     Gait: Gait is intact.  Psychiatric:        Mood and Affect: Mood normal.        Behavior: Behavior normal. Behavior is cooperative.     ED Results and Treatments Labs (all labs ordered are listed, but only abnormal results are displayed) Labs Reviewed  URINALYSIS, ROUTINE W REFLEX MICROSCOPIC - Abnormal; Notable for the following components:       Result Value   APPearance HAZY (*)    Glucose, UA >=500 (*)    Leukocytes,Ua TRACE (*)    All other components within normal limits  COMPREHENSIVE METABOLIC PANEL - Abnormal; Notable for the following components:   Sodium 132 (*)    Glucose, Bld 355 (*)    Creatinine, Ser 1.10 (*)    Calcium 8.8 (*)    ALT 46 (*)    All other components within normal limits  CBC WITH DIFFERENTIAL/PLATELET - Abnormal; Notable for the following components:   Abs Immature Granulocytes 0.09 (*)    All other components within normal limits  CBG MONITORING, ED - Abnormal; Notable for the following components:   Glucose-Capillary 414 (*)    All other components within normal limits  SARS CORONAVIRUS 2 BY RT PCR  PREGNANCY, URINE  TROPONIN I (HIGH SENSITIVITY)  Radiology CT ABDOMEN PELVIS W CONTRAST  Result Date: 08/18/2023 CLINICAL DATA:  Cough, body aches, right abdominal pain, urinary frequency EXAM: CT ABDOMEN AND PELVIS WITH CONTRAST TECHNIQUE: Multidetector CT imaging of the abdomen and pelvis was performed using the standard protocol following bolus administration of intravenous contrast. RADIATION DOSE REDUCTION: This exam was performed according to the departmental dose-optimization program which includes automated exposure control, adjustment of the mA and/or kV according to patient size and/or use of iterative reconstruction technique. CONTRAST:  OMNIPAQUE IOHEXOL 300 MG/ML  SOLN COMPARISON:  None Available. FINDINGS: Lower chest: Lung bases are clear. Hepatobiliary: Liver is within normal limits. Gallbladder is unremarkable. No intrahepatic or extrahepatic ductal dilatation. Pancreas: Within normal limits. Spleen: Within normal limits. Adrenals/Urinary Tract: Adrenal glands are within normal limits. Kidneys are within normal limits.  No hydronephrosis. Bladder is within normal  limits. Stomach/Bowel: Stomach is within normal limits. No evidence of bowel obstruction. Normal appendix (series 2/image 62). No colonic wall thickening or inflammatory changes. Vascular/Lymphatic: No evidence of abdominal aortic aneurysm. No suspicious abdominopelvic lymphadenopathy. Reproductive: Uterus is within normal limits. Bilateral ovaries are within normal limits. Other: Trace pelvic ascites, likely physiologic. Musculoskeletal: Visualized osseous structures are within normal limits. IMPRESSION: Negative CT abdomen/pelvis. Electronically Signed   By: Charline Bills M.D.   On: 08/18/2023 23:04   DG Chest Portable 1 View  Result Date: 08/18/2023 CLINICAL DATA:  Cough EXAM: PORTABLE CHEST 1 VIEW COMPARISON:  Radiographs 11/13/2022 FINDINGS: Normal cardiomediastinal silhouette. No focal consolidation, pleural effusion, or pneumothorax. No displaced rib fractures. IMPRESSION: No acute cardiopulmonary disease. Electronically Signed   By: Minerva Fester M.D.   On: 08/18/2023 22:27    Pertinent labs & imaging results that were available during my care of the patient were reviewed by me and considered in my medical decision making (see MDM for details).  Medications Ordered in ED Medications  sodium chloride 0.9 % bolus 1,000 mL (1,000 mLs Intravenous New Bag/Given 08/18/23 2210)  ketorolac (TORADOL) 15 MG/ML injection 15 mg (15 mg Intravenous Given 08/18/23 2204)  acetaminophen (TYLENOL) tablet 1,000 mg (1,000 mg Oral Given 08/18/23 2204)  iohexol (OMNIPAQUE) 300 MG/ML solution 100 mL (100 mLs Intravenous Contrast Given 08/18/23 2237)                                                                                                                                     Procedures Procedures  (including critical care time)  Medical Decision Making / ED Course    Medical Decision Making:    EMYLE HAGGE is a 32 y.o. female with past medical history as below, significant for DM, GERD,  hypertension, kidney stone, anxiety and depression, obesity who presents to the ED with complaint of bodyaches, right-sided flank pain, headaches, polyuria, polydipsia.. The complaint involves an extensive differential diagnosis and also carries with it a high risk of complications and morbidity.  Serious etiology was considered. Ddx includes  but is not limited to: Differential diagnosis includes but is not exclusive to ectopic pregnancy, ovarian cyst, ovarian torsion, acute appendicitis, urinary tract infection, endometriosis, bowel obstruction, hernia, colitis, renal colic, gastroenteritis, volvulus etc. In my evaluation of this patient's dyspnea my DDx includes, but is not limited to, pneumonia, pulmonary embolism, pneumothorax, pulmonary edema, metabolic acidosis, asthma, COPD, cardiac cause, anemia, anxiety, etc.    Complete initial physical exam performed, notably the patient  was no acute distress, breathing comfortably in ambient air.  Abdomen soft, nonperitoneal.    Reviewed and confirmed nursing documentation for past medical history, family history, social history.  Vital signs reviewed.    Clinical Course as of 08/18/23 2321  Wynelle Link Aug 18, 2023  2309 CTAP stable [SG]  2309 Labs stable, pain is improved.  To have increased admission for possible viral syndrome diffuse nature of her arthralgias, concurrent HA, congestion, cough.  [SG]  2318 Creatinine(!): 1.10 Mildly elevated from prior, given IV fluids [SG]  2319 Symptoms improved on recheck [SG]    Clinical Course User Index [SG] Tanda Rockers A, DO     UA with sterile pyuria, no bacteria.  CT without evidence of acute intra-abdominal abnormality.  Favor polyuria frequency is due to poorly controlled diabetes.  She denies concern for STI.  Labs and imaging grossly stable, she is feeling better.  Patient presents with headache. Based on the patient's history and physical there is very low clinical suspicion for significant  intracranial pathology. The headache was not sudden onset, not maximal at onset, there are no neurologic findings on exam, the patient does not have a fever, the patient does not have any jaw claudication, the patient does not endorse a clotting disorder, patient denies any trauma or eye pain and the headache is not associated with dizziness, weakness on one side of the body, diplopia, vertigo, slurred speech, or ataxia. Given the extremely low risk of these diagnoses further testing and evaluation for these possibilities does not appear to be indicated at this time.  The patient's overall condition has improved, the patient presents with abdominal pain without signs of peritonitis, or other life-threatening serious etiology. The patient understands that at this time there is no evidence for a more malignant underlying process, but the patient also understands that early in the process of an illness, an emergency department workup can be falsely reassuring. Detailed discussions were had with the patient regarding current findings, and need for close f/u with PCP or on call doctor. The patient appears stable for discharge and has been instructed to return immediately if the symptoms worsen in any way or if not improved for re-evaluation. Patient verbalized understanding and is in agreement with current care plan.  All questions answered prior to discharge.              Additional history obtained: -Additional history obtained from spouse -External records from outside source obtained and reviewed including: Chart review including previous notes, labs, imaging, consultation notes including  Prior ED visits, home medications, prior labs and imaging   Lab Tests: -I ordered, reviewed, and interpreted labs.   The pertinent results include:   Labs Reviewed  URINALYSIS, ROUTINE W REFLEX MICROSCOPIC - Abnormal; Notable for the following components:      Result Value   APPearance HAZY (*)     Glucose, UA >=500 (*)    Leukocytes,Ua TRACE (*)    All other components within normal limits  COMPREHENSIVE METABOLIC PANEL - Abnormal; Notable for the following components:  Sodium 132 (*)    Glucose, Bld 355 (*)    Creatinine, Ser 1.10 (*)    Calcium 8.8 (*)    ALT 46 (*)    All other components within normal limits  CBC WITH DIFFERENTIAL/PLATELET - Abnormal; Notable for the following components:   Abs Immature Granulocytes 0.09 (*)    All other components within normal limits  CBG MONITORING, ED - Abnormal; Notable for the following components:   Glucose-Capillary 414 (*)    All other components within normal limits  SARS CORONAVIRUS 2 BY RT PCR  PREGNANCY, URINE  TROPONIN I (HIGH SENSITIVITY)    Notable for hyperglycemia, no DKA  EKG   EKG Interpretation Date/Time:  Sunday August 18 2023 20:11:42 EDT Ventricular Rate:  101 PR Interval:  147 QRS Duration:  81 QT Interval:  332 QTC Calculation: 431 R Axis:   113  Text Interpretation: Sinus tachycardia Right axis deviation similar to prior, rate increased Confirmed by Tanda Rockers ([id number]) on 08/18/2023 9:44:45 PM         Imaging Studies ordered: I ordered imaging studies including chest x-ray, CTAP I independently visualized the following imaging with scope of interpretation limited to determining acute life threatening conditions related to emergency care; findings noted above, significant for stable imaging I independently visualized and interpreted imaging. I agree with the radiologist interpretation   Medicines ordered and prescription drug management: Meds ordered this encounter  Medications   sodium chloride 0.9 % bolus 1,000 mL   ketorolac (TORADOL) 15 MG/ML injection 15 mg   acetaminophen (TYLENOL) tablet 1,000 mg   iohexol (OMNIPAQUE) 300 MG/ML solution 100 mL   prochlorperazine (COMPAZINE) 5 MG tablet    Sig: Take 1 tablet (5 mg total) by mouth 2 (two) times daily as needed for up to 10 doses  (headache).    Dispense:  10 tablet    Refill:  0    -I have reviewed the patients home medicines and have made adjustments as needed   Consultations Obtained: na   Cardiac Monitoring: The patient was maintained on a cardiac monitor.  I personally viewed and interpreted the cardiac monitored which showed an underlying rhythm of: NSR  Social Determinants of Health:  Diagnosis or treatment significantly limited by social determinants of health: current smoker and obesity   Reevaluation: After the interventions noted above, I reevaluated the patient and found that they have improved  Co morbidities that complicate the patient evaluation  Past Medical History:  Diagnosis Date   Asthma    Chronic back pain    Diabetes mellitus without complication (HCC) 05/2019   GERD (gastroesophageal reflux disease)    Hypertension    Kidney stone    Nausea and vomiting    recurrent   Recurrent abdominal pain    Seasonal allergies       Dispostion: Disposition decision including need for hospitalization was considered, and patient discharged from emergency department.    Final Clinical Impression(s) / ED Diagnoses Final diagnoses:  Upper respiratory tract infection, unspecified type  Abdominal pain, unspecified abdominal location  Hyperglycemia  Nonintractable headache, unspecified chronicity pattern, unspecified headache type        Sloan Leiter, DO 08/18/23 2322

## 2023-12-26 ENCOUNTER — Ambulatory Visit
Admission: EM | Admit: 2023-12-26 | Discharge: 2023-12-26 | Disposition: A | Discharge status: Home / Self Care | Payer: Medicaid Other | Attending: Nurse Practitioner | Admitting: Nurse Practitioner

## 2023-12-26 DIAGNOSIS — R197 Diarrhea, unspecified: Secondary | ICD-10-CM

## 2023-12-26 DIAGNOSIS — R112 Nausea with vomiting, unspecified: Secondary | ICD-10-CM | POA: Diagnosis not present

## 2023-12-26 LAB — POC COVID19/FLU A&B COMBO
Covid Antigen, POC: NEGATIVE
Influenza A Antigen, POC: NEGATIVE
Influenza B Antigen, POC: NEGATIVE

## 2023-12-26 MED ORDER — ONDANSETRON 4 MG PO TBDP
4.0000 mg | ORAL_TABLET | Freq: Three times a day (TID) | ORAL | 0 refills | Status: AC | PRN
Start: 2023-12-26 — End: ?

## 2023-12-26 NOTE — ED Triage Notes (Signed)
Pt reports nausea, vomiting, headache, diarrhea abdominal pain, right ear x pain x 1 day pt states she did have recently have an ear infection

## 2023-12-26 NOTE — ED Provider Notes (Signed)
[facility]    CSN: [id number] Arrival date & time: 12/26/23  1106      History   Chief Complaint No chief complaint on file.   HPI Danielle Zimmerman is a 33 y.o. female.   Patient presents today with 1 day history of abdominal pain, nausea, vomiting, diarrhea, headache, chills.  She reports 2 episodes of diarrhea today, 2 episodes of vomiting today but had more episodes of both vomiting and diarrhea yesterday.  She denies blood in her stool and reports there are some formed pieces-is not pure water.  She endorses a little bit of thick red blood in her vomit, no coffee-ground emesis.  Reports has been told she is ruptured a blood vessel from vomiting before.  No vomiting blood today.  It was not a large amount of blood.  She currently does not feel nauseous but does feel nauseous whenever she eats anything.  No current abdominal pain.  No recent foreign travel, recent ingestion of suspicious drinking water.  She was recently treated for a right ear infection with amoxicillin and reports she is still having ear pain.  No cough, congestion, or sore throat.  She works at a [employer] and has been around people who have had stomach bugs.    Past Medical History:  Diagnosis Date   Asthma    Chronic back pain    Diabetes mellitus without complication (HCC) 05/2019   GERD (gastroesophageal reflux disease)    Hypertension    Kidney stone    Nausea and vomiting    recurrent   Recurrent abdominal pain    Seasonal allergies     Patient Active Problem List   Diagnosis Date Noted   Hypertension 03/24/2020   Type 2 diabetes mellitus with other specified complication (HCC) 03/24/2020   Adult BMI 31.0-31.9 kg/sq m 03/24/2020   PTSD (post-traumatic stress disorder) 03/24/2020   Anxiety and depression 03/24/2020    Past Surgical History:  Procedure Laterality Date   CESAREAN SECTION     CLOSED REDUCTION NASAL FRACTURE N/A 04/21/2021   Procedure: CLOSED REDUCTION NASAL  FRACTURE;  Surgeon: Newman Pies, MD;  Location: [facility];  Service: ENT;  Laterality: N/A;   TONSILLECTOMY AND ADENOIDECTOMY      OB History     Gravida  1   Para      Term      Preterm      AB      Living         SAB      IAB      Ectopic      Multiple      Live Births               Home Medications    Prior to Admission medications   Medication Sig Start Date End Date Taking? Authorizing Provider  ondansetron (ZOFRAN-ODT) 4 MG disintegrating tablet Take 1 tablet (4 mg total) by mouth every 8 (eight) hours as needed. 12/26/23  Yes Valentino Nose, NP  atorvastatin (LIPITOR) 20 MG tablet Take 1 tablet (20 mg total) by mouth daily. 03/24/20   Elmore Guise, FNP  cetirizine (ZYRTEC ALLERGY) 10 MG tablet Take 1 tablet (10 mg total) by mouth 2 (two) times daily. 03/04/23   Particia Nearing, PA-C  diphenhydrAMINE (BENADRYL) 50 MG capsule Take 50 mg by mouth every 6 (six) hours as needed.    [provider]  fluticasone (FLONASE) 50 MCG/ACT nasal spray Place  1 spray into both nostrils daily for 7 days. 06/09/20 06/16/20  Rancour, Jeannett Senior, MD  ibuprofen (ADVIL) 600 MG tablet Take 1 tablet (600 mg total) by mouth every 6 (six) hours as needed. 06/09/20   Rancour, Jeannett Senior, MD  Insulin Pen Needle 32G X 4 MM MISC Use as directed with insulin daily 05/10/20   Lawson Fiscal A, FNP  LANTUS SOLOSTAR 100 UNIT/ML Solostar Pen Inject 40 Units into the skin at bedtime. Patient taking differently: Inject 60 Units into the skin at bedtime. 03/24/20   Elmore Guise, FNP  losartan-hydrochlorothiazide (HYZAAR) 50-12.5 MG tablet Take 1 tablet by mouth daily. 03/24/20   Elmore Guise, FNP  metFORMIN (GLUCOPHAGE) 500 MG tablet Take 1 tablet (500 mg total) by mouth 2 (two) times daily with a meal. 06/15/23   Bethann Berkshire, MD  prochlorperazine (COMPAZINE) 5 MG tablet Take 1 tablet (5 mg total) by mouth 2 (two) times daily as needed for up to 10 doses (headache).  08/18/23   Sloan Leiter, DO  traMADol (ULTRAM) 50 MG tablet Take 1 tablet (50 mg total) by mouth every 6 (six) hours as needed. 04/11/21   Pauline Aus, PA-C    Family History History reviewed. No pertinent family history.  Social History Social History   Tobacco Use   Smoking status: Every Day    Current packs/day: 0.00    Average packs/day: 0.5 packs/day for 10.0 years (5.0 ttl pk-yrs)    Types: Cigarettes    Start date: 08/11/2010    Last attempt to quit: 08/11/2020    Years since quitting: 3.3   Smokeless tobacco: Never   Tobacco comments:    2-3 cigarettes daily  Vaping Use   Vaping status: Never Used  Substance Use Topics   Alcohol use: No   Drug use: Yes    Types: Marijuana     Allergies   Hydrocodone   Review of Systems Review of Systems Per HPI  Physical Exam Triage Vital Signs ED Triage Vitals  Encounter Vitals Group     BP 12/26/23 1143 120/84     Systolic BP Percentile --      Diastolic BP Percentile --      Pulse Rate 12/26/23 1143 91     Resp 12/26/23 1143 17     Temp 12/26/23 1143 98.7 F (37.1 C)     Temp Source 12/26/23 1143 Oral     SpO2 12/26/23 1143 98 %     Weight --      Height --      Head Circumference --      Peak Flow --      Pain Score 12/26/23 1146 7     Pain Loc --      Pain Education --      Exclude from Growth Chart --    No data found.  Updated Vital Signs BP 120/84 (BP Location: Right Arm)   Pulse 91   Temp 98.7 F (37.1 C) (Oral)   Resp 17   LMP 12/04/2023 (Exact Date)   SpO2 98%   Visual Acuity Right Eye Distance:   Left Eye Distance:   Bilateral Distance:    Right Eye Near:   Left Eye Near:    Bilateral Near:     Physical Exam Vitals and nursing note reviewed.  Constitutional:      General: She is not in acute distress.    Appearance: Normal appearance. She is not toxic-appearing.  HENT:     Head: Normocephalic  and atraumatic.     Right Ear: Tympanic membrane, ear canal and external ear normal.      Left Ear: Tympanic membrane, ear canal and external ear normal.     Mouth/Throat:     Mouth: Mucous membranes are moist.     Pharynx: Oropharynx is clear.  Eyes:     General: No scleral icterus.    Extraocular Movements: Extraocular movements intact.  Cardiovascular:     Rate and Rhythm: Normal rate and regular rhythm.  Pulmonary:     Effort: Pulmonary effort is normal. No respiratory distress.     Breath sounds: Normal breath sounds. No wheezing, rhonchi or rales.  Abdominal:     General: Abdomen is flat. Bowel sounds are normal. There is no distension.     Palpations: Abdomen is soft.     Tenderness: There is no abdominal tenderness. There is no guarding.  Musculoskeletal:     Cervical back: Normal range of motion.  Lymphadenopathy:     Cervical: No cervical adenopathy.  Skin:    General: Skin is warm and dry.     Capillary Refill: Capillary refill takes less than 2 seconds.     Coloration: Skin is not jaundiced or pale.     Findings: No erythema.  Neurological:     Mental Status: She is alert and oriented to person, place, and time.  Psychiatric:        Behavior: Behavior is cooperative.      UC Treatments / Results  Labs (all labs ordered are listed, but only abnormal results are displayed) Labs Reviewed  POC COVID19/FLU A&B COMBO    EKG   Radiology No results found.  Procedures Procedures (including critical care time)  Medications Ordered in UC Medications - No data to display  Initial Impression / Assessment and Plan / UC Course  I have reviewed the triage vital signs and the nursing notes.  Pertinent labs & imaging results that were available during my care of the patient were reviewed by me and considered in my medical decision making (see chart for details).   Patient is well-appearing, normotensive, afebrile, not tachycardic, not tachypneic, oxygenating well on room air.    1. Nausea, vomiting, and diarrhea Vitals and exam are reassuring  today Suspect viral gastroenteritis, specifically norovirus COVID-19 and influenza testing is negative Supportive care discussed with patient, start Zofran 4 mg ODT every 8 hours, push hydration plenty fluids Return and ER precautions discussed with patient Work excuse provided  The patient was given the opportunity to ask questions.  All questions answered to their satisfaction.  The patient is in agreement to this plan.   Final Clinical Impressions(s) / UC Diagnoses   Final diagnoses:  Nausea, vomiting, and diarrhea     Discharge Instructions      As we discussed, you most likely have a stomach bug.  Take the Zofran every 8 hours as needed to prevent vomiting.  Make sure you are drinking plenty of water and/or Pedialyte to help maintain hydration.  If you feel like eating, recommend bananas, rice, applesauce, toast until your symptoms improve.  Seek care if you develop severe abdominal pain, nausea/vomiting and are unable to keep food or fluids down.     ED Prescriptions     Medication Sig Dispense Auth. Provider   ondansetron (ZOFRAN-ODT) 4 MG disintegrating tablet Take 1 tablet (4 mg total) by mouth every 8 (eight) hours as needed. 20 tablet Valentino Nose, NP      PDMP  not reviewed this encounter.   Valentino Nose, NP 12/26/23 [phone]

## 2023-12-26 NOTE — Discharge Instructions (Signed)
As we discussed, you most likely have a stomach bug.  Take the Zofran every 8 hours as needed to prevent vomiting.  Make sure you are drinking plenty of water and/or Pedialyte to help maintain hydration.  If you feel like eating, recommend bananas, rice, applesauce, toast until your symptoms improve.  Seek care if you develop severe abdominal pain, nausea/vomiting and are unable to keep food or fluids down.

## 2024-12-31 ENCOUNTER — Ambulatory Visit: Payer: Self-pay

## 2024-12-31 NOTE — Telephone Encounter (Signed)
 FYI Only or Action Required?: FYI only for provider: ED advised.  Patient was last seen in primary care on No [facility] PCP.  Called Nurse Triage reporting Blood Sugar Problem.  Symptoms began about a month ago.  Interventions attempted: Rest, hydration, or home remedies.  Symptoms are: gradually worsening.  Triage Disposition: Go to ED Now (Notify PCP)  Patient/caregiver understands and will follow disposition?: Yes     BG 498 at 6pm last night. 484 over the phone. Dx with DM2 5 years ago. Has been elevated for past year. Normally in the 200s. Has been 400-600 daily for the past month. Has been throwing up. Last was 2 days ago. No rapid breathing. Dry mouth, very thirsty, drinking a lot. Triage cut short d/t severity of pt symptoms. Advised ED, agreeable to go and has someone that can drive her. Advised 911 for worsening symptoms.      Message from [place] T sent at 12/31/2024 12:23 PM EST  Reason for Triage: blood sugar running in the 500-600 range- dehydrated- frequent urination   Reason for Disposition  [1] Vomiting AND [2] signs of dehydration (e.g., very dry mouth, lightheaded, dark urine)  Answer Assessment - Initial Assessment Questions 1. BLOOD GLUCOSE: What is your blood glucose level?      484  2. ONSET: When did you check the blood glucose?     Today  3. USUAL RANGE: What is your glucose level usually? (e.g., usual fasting morning value, usual evening value)     In the 200s. Has been in the 400-600 range for the past month.  4. KETONES: Do you check for ketones (urine or blood test strips)? If Yes, ask: What does the test show now?      *No Answer*  5. TYPE 1 or 2:  Do you know what type of diabetes you have?  (e.g., Type 1, Type 2, Gestational; doesn't know)      2  6. INSULIN : Do you take insulin ? What type of insulin (s) do you use? What is the mode of delivery? (syringe, pen; injection or pump)?      *No Answer*  7. DIABETES PILLS: Do you  take any pills for your diabetes? If Yes, ask: Have you missed taking any pills recently?     *No Answer*  8. OTHER SYMPTOMS: Do you have any symptoms? (e.g., fever, frequent urination, difficulty breathing, dizziness, weakness, vomiting)     Frequent urination, extreme thirst, weakness, vomiting  9. PREGNANCY: Is there any chance you are pregnant? When was your last menstrual period?     *No Answer*  Protocols used: Diabetes - High Blood Sugar-A-AH
